# Patient Record
Sex: Male | Born: 1955 | Race: White | Hispanic: No | Marital: Married | Smoking: Former smoker
Health system: Southern US, Community
[De-identification: ages and names within clinical notes are randomized; demographics above are authoritative.]

## PROBLEM LIST (undated history)

## (undated) DIAGNOSIS — I10 Essential (primary) hypertension: Secondary | ICD-10-CM

## (undated) DIAGNOSIS — N200 Calculus of kidney: Secondary | ICD-10-CM

## (undated) DIAGNOSIS — I5031 Acute diastolic (congestive) heart failure: Secondary | ICD-10-CM

## (undated) DIAGNOSIS — I7090 Unspecified atherosclerosis: Secondary | ICD-10-CM

## (undated) DIAGNOSIS — E781 Pure hyperglyceridemia: Secondary | ICD-10-CM

## (undated) DIAGNOSIS — F1010 Alcohol abuse, uncomplicated: Secondary | ICD-10-CM

## (undated) DIAGNOSIS — F17200 Nicotine dependence, unspecified, uncomplicated: Secondary | ICD-10-CM

## (undated) DIAGNOSIS — E785 Hyperlipidemia, unspecified: Secondary | ICD-10-CM

## (undated) HISTORY — DX: Essential (primary) hypertension: I10

## (undated) HISTORY — PX: HERNIA REPAIR: SHX51

## (undated) HISTORY — DX: Alcohol abuse, uncomplicated: F10.10

## (undated) HISTORY — DX: Hyperlipidemia, unspecified: E78.5

## (undated) HISTORY — DX: Pure hyperglyceridemia: E78.1

## (undated) HISTORY — DX: Calculus of kidney: N20.0

## (undated) HISTORY — DX: Nicotine dependence, unspecified, uncomplicated: F17.200

---

## 1989-05-28 HISTORY — PX: OTHER SURGICAL HISTORY: SHX169

## 2012-02-14 ENCOUNTER — Ambulatory Visit: Payer: PRIVATE HEALTH INSURANCE | Admitting: Family Medicine

## 2012-02-14 VITALS — BP 147/81 | HR 74 | Temp 98.4°F | Resp 16 | Ht 70.75 in | Wt 171.8 lb

## 2012-02-14 DIAGNOSIS — L0390 Cellulitis, unspecified: Secondary | ICD-10-CM

## 2012-02-14 DIAGNOSIS — L0291 Cutaneous abscess, unspecified: Secondary | ICD-10-CM

## 2012-02-14 MED ORDER — DOXYCYCLINE HYCLATE 100 MG PO TABS: 100.0000 mg | ORAL_TABLET | Freq: Two times a day (BID) | ORAL | Status: AC

## 2012-02-14 NOTE — Progress Notes (Signed)
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 The last 2 days he's developed a light red enlarging rash the site of the left abdominal tic bite and some erythema and drainage by right medial by tick bites. The thigh tick bites or itching.

## 2012-02-14 NOTE — Patient Instructions (Signed)
Wood Tick Bite Ticks are insects that attach themselves to the skin. Most tick bites are harmless, but sometimes ticks carry diseases that can make a person quite ill. The chance of getting ill depends on:  The kind of tick that bites you.   Time of year.   How long the tick is attached.   Geographic location.  Wood ticks are also called dog ticks. They are generally black. They can have white markings. They live in shrubs and grassy areas. They are larger than deer ticks. Wood ticks are about the size of a watermelon seed. They have a hard body. The most common places for ticks to attach themselves are the scalp, neck, armpits, waist, and groin. Wood ticks may stay attached for up to 2 weeks. TICKS MUST BE REMOVED AS SOON AS POSSIBLE TO HELP PREVENT DISEASES CAUSED BY TICK BITES.  TO REMOVE A TICK: 1. If available, put on latex gloves before trying to remove a tick.  2. Grasp the tick as close to the skin as possible, with curved forceps, fine tweezers or a special tick removal tool.  3. Pull gently with steady pressure until the tick lets go. Do not twist the tick or jerk it suddenly. This may break off the tick's head or mouth parts.  4. Do not crush the tick's body. This could force disease-carrying fluids from the tick into your body.  5. After the tick is removed, wash the bite area and your hands with soap and water or other disinfectant.  6. Apply a small amount of antiseptic cream or ointment to the bite site.  7. Wash and disinfect any instruments that were used.  8. Save the tick in a jar or plastic bag for later identification. Preserve the tick with a bit of alcohol or put it in the freezer.  9. Do not apply a hot match, petroleum jelly, or fingernail polish to the tick. This does not work and may increase the chances of disease from the tick bite.  YOU MAY NEED TO SEE YOUR CAREGIVER FOR A TETANUS SHOT NOW IF:  You have no idea when you had the last one.   You have never had a  tetanus shot before.  If you need a tetanus shot, and you decide not to get one, there is a rare chance of getting tetanus. Sickness from tetanus can be serious. If you get a tetanus shot, your arm may swell, get red and warm to the touch at the shot site. This is common and not a problem. PREVENTION  Wear protective clothing. Long sleeves and pants are best.   Wear white clothes to see ticks more easily   Tuck your pant legs into your socks.   If walking on trail, stay in the middle of the trail to avoid brushing against bushes.   Put insect repellent on all exposed skin and along boot tops, pant legs and sleeve cuffs   Check clothing, hair and skin repeatedly and before coming inside.   Brush off any ticks that are not attached.  SEEK MEDICAL CARE IF:   You cannot remove a tick or part of the tick that is left in the skin.   Unexplained fever.   Redness and swelling in the area of the tick bite.   Tender, swollen lymph glands.   Diarrhea.   Weight loss.   Cough.   Fatigue.   Muscle, joint or bone pain.   Belly pain.   Headache.   Rash.    SEEK IMMEDIATE MEDICAL CARE IF:   You develop an oral temperature above 102 F (38.9 C).   You are having trouble walking or moving your legs.  Numbness in the legs. Lyme Disease You may have been bitten by a tick and are to watch for the development of Lyme Disease. Lyme Disease is an infection that is caused by a bacteria The bacteria causing this disease is named Borreilia burgdorferi. If a tick is infected with this bacteria and then bites you, then Lyme Disease may occur. These ticks are carried by deer and rodents such as rabbits and mice and infest grassy as well as forested areas. Fortunately most tick bites do not cause Lyme Disease.  Lyme Disease is easier to prevent than to treat. First, covering your legs with clothing when walking in areas where ticks are possibly abundant will prevent their attachment because ticks  tend to stay within inches of the ground. Second, using insecticides containing DEET can be applied on skin or clothing. Last, because it takes about 12 to 24 hours for the tick to transmit the disease after attachment to the human host, you should inspect your body for ticks twice a day when you are in areas where Lyme Disease is common. You must look thoroughly when searching for ticks. The Ixodes tick that carries Lyme Disease is very small. It is around the size of a sesame seed (picture of tick is not actual size). Removal is best done by grasping the tick by the head and pulling it out. Do not to squeeze the body of the tick. This could inject the infecting bacteria into the bite site. Wash the area of the bite with an antiseptic solution after removal.  Lyme Disease is a disease that may affect many body systems. Because of the small size of the biting tick, most people do not notice being bitten. The first sign of an infection is usually a round red rash that extends out from the center of the tick bite. The center of the lesion may be blood colored (hemorrhagic) or have tiny blisters (vesicular). Most lesions have bright red outer borders and partial central clearing. This rash may extend out many inches in diameter, and multiple lesions may be present. Other symptoms such as fatigue, headaches, chills and fever, general achiness and swelling of lymph glands may also occur. If this first stage of the disease is left untreated, these symptoms may gradually resolve by themselves, or progressive symptoms may occur because of spread of infection to other areas of the body.  Follow up with your caregiver to have testing and treatment if you have a tick bite and you develop any of the above complaints. Your caregiver may recommend preventative (prophylactic) medications which kill bacteria (antibiotics). Once a diagnosis of Lyme Disease is made, antibiotic treatment is highly likely to cure the disease.  Effective treatment of late stage Lyme Disease may require longer courses of antibiotic therapy.  MAKE SURE YOU:  Understand these instructions.  Will watch your condition.  Will get help right away if you are not doing well or get worse.  Document Released: 12/20/2000 Document Revised: 09/02/2011 Document Reviewed: 02/21/2009  [facility] Patient Information 2012 [place], [place].  Shortness of breath.   Confusion.   Repeated vomiting.  Document Released: 09/10/2000 Document Revised: 09/02/2011 Document Reviewed: 08/19/2008 [facility] Patient Information 2012 [place], [place].

## 2013-05-16 ENCOUNTER — Encounter: Payer: Self-pay | Admitting: Cardiovascular Disease

## 2013-05-16 ENCOUNTER — Ambulatory Visit (INDEPENDENT_AMBULATORY_CARE_PROVIDER_SITE_OTHER): Payer: BC Managed Care – PPO | Admitting: Cardiovascular Disease

## 2013-05-16 VITALS — BP 128/70 | HR 99 | Ht 71.0 in | Wt 179.1 lb

## 2013-05-16 DIAGNOSIS — Z79899 Other long term (current) drug therapy: Secondary | ICD-10-CM

## 2013-05-16 DIAGNOSIS — E785 Hyperlipidemia, unspecified: Secondary | ICD-10-CM

## 2013-05-16 DIAGNOSIS — Z720 Tobacco use: Secondary | ICD-10-CM | POA: Insufficient documentation

## 2013-05-16 DIAGNOSIS — F17200 Nicotine dependence, unspecified, uncomplicated: Secondary | ICD-10-CM

## 2013-05-16 DIAGNOSIS — R5383 Other fatigue: Secondary | ICD-10-CM

## 2013-05-16 DIAGNOSIS — R3911 Hesitancy of micturition: Secondary | ICD-10-CM

## 2013-05-16 DIAGNOSIS — R0602 Shortness of breath: Secondary | ICD-10-CM

## 2013-05-16 DIAGNOSIS — I10 Essential (primary) hypertension: Secondary | ICD-10-CM

## 2013-05-16 DIAGNOSIS — R5381 Other malaise: Secondary | ICD-10-CM

## 2013-05-16 NOTE — Assessment & Plan Note (Signed)
We will recheck a fasting lipid and liver profile

## 2013-05-16 NOTE — Assessment & Plan Note (Signed)
Well-controlled on current medications 

## 2013-05-16 NOTE — Progress Notes (Signed)
   05/16/2013 James Stokes James Stokes   04-06-1956  [id number]  Primary Physician No PCP Per Patient Primary Cardiologist: [name] MD Roseanne Reno   HPI:  The patient is a 57 year old, mildly overweight, married Caucasian male, father of 3 who I last saw a year ago. He has a history of hypertension and hyperlipidemia. He does smoke 2 packs a day and drinks 3 to 5 beers a night. He has hypertriglyceridemia as well. His last Myoview performed May 06, 2004, was nonischemic. Since I last saw him a year ago he denies chest pain or significantly changed breathing pattern     Current Outpatient Prescriptions  Medication Sig Dispense Refill  . amLODipine (NORVASC) 5 MG tablet Take 5 mg by mouth daily.      Marland Kitchen losartan-hydrochlorothiazide (HYZAAR) 100-25 MG per tablet Take 1 tablet by mouth daily.       No current facility-administered medications for this visit.    Allergies  Allergen Reactions  . Other     Seafood - causes itching    History   Social History  . Marital Status: Married    Spouse Name: N/A    Number of Children: N/A  . Years of Education: N/A   Occupational History  . Not on file.   Social History Main Topics  . Smoking status: Current Every Day Smoker -- 2.00 packs/day for 40 years    Types: Cigarettes  . Smokeless tobacco: Former Neurosurgeon  . Alcohol Use: Yes  . Drug Use: Not on file  . Sexual Activity: Not on file   Other Topics Concern  . Not on file   Social History Narrative  . No narrative on file     Review of Systems: General: negative for chills, fever, night sweats or weight changes.  Cardiovascular: negative for chest pain, dyspnea on exertion, edema, orthopnea, palpitations, paroxysmal nocturnal dyspnea or shortness of breath Dermatological: negative for rash Respiratory: negative for cough or wheezing Urologic: negative for hematuria Abdominal: negative for nausea, vomiting, diarrhea, bright red blood per rectum, melena, or  hematemesis Neurologic: negative for visual changes, syncope, or dizziness All other systems reviewed and are otherwise negative except as noted above.    Blood pressure 128/70, pulse 99, height 5\' 11"  (1.803 m), weight 179 lb 1.6 oz (81.239 kg).  General appearance: alert and no distress Neck: no adenopathy, no carotid bruit, no JVD, supple, symmetrical, trachea midline and thyroid not enlarged, symmetric, no tenderness/mass/nodules Lungs: clear to auscultation bilaterally Heart: regular rate and rhythm, S1, S2 normal, no murmur, click, rub or gallop Extremities: extremities normal, atraumatic, no cyanosis or edema  EKG sinus rhythm at 99 with incomplete right bundle branch block and posterior fascicular block  ASSESSMENT AND PLAN:   Essential hypertension Well-controlled on current medications  Hyperlipidemia We will recheck a fasting lipid and liver profile      [name] MD [facility], [facility] 05/16/2013 11:18 AM

## 2013-05-16 NOTE — Patient Instructions (Addendum)
  We will see you back in follow up in 1 year with an extender  Dr Allyson Sabal has ordered blood work and a chest xray

## 2013-05-17 ENCOUNTER — Telehealth: Payer: Self-pay | Admitting: Cardiovascular Disease

## 2013-05-17 ENCOUNTER — Other Ambulatory Visit: Payer: Self-pay

## 2013-05-17 MED ORDER — AMLODIPINE BESYLATE 5 MG PO TABS: 5.0000 mg | ORAL_TABLET | Freq: Every day | ORAL | Status: DC

## 2013-05-17 MED ORDER — LOSARTAN POTASSIUM-HCTZ 100-25 MG PO TABS: 1.0000 | ORAL_TABLET | Freq: Every day | ORAL | Status: DC

## 2013-05-17 NOTE — Telephone Encounter (Signed)
Please call-concerning my chart

## 2013-05-17 NOTE — Telephone Encounter (Signed)
Rx was sent to pharmacy electronically. 

## 2013-05-17 NOTE — Telephone Encounter (Signed)
Returned call and spoke w/ wife, James Stokes.  Wanted to know how to update pt's immunizations in MyChart.  Informed records will need to brought in to the office to be updated.  Also gave number to [facility] for MyChart to call in case pt is able to update via the portal.  Verbalized understanding and agreed w/ plan.

## 2013-05-23 ENCOUNTER — Ambulatory Visit
Admission: RE | Admit: 2013-05-23 | Discharge: 2013-05-23 | Disposition: A | Discharge status: Home / Self Care | Payer: BC Managed Care – PPO | Source: Ambulatory Visit | Attending: Cardiovascular Disease | Admitting: Cardiovascular Disease

## 2013-05-23 ENCOUNTER — Other Ambulatory Visit: Payer: Self-pay | Admitting: Cardiovascular Disease

## 2013-05-23 DIAGNOSIS — R0602 Shortness of breath: Secondary | ICD-10-CM

## 2013-05-23 LAB — LIPID PANEL: Triglycerides: 455 mg/dL — ABNORMAL HIGH (ref <150)

## 2013-05-23 LAB — HEPATIC FUNCTION PANEL
Alkaline Phosphatase: 75 U/L (ref 39–117)
Bilirubin, Direct: 0.1 mg/dL (ref 0.0–0.3)
Indirect Bilirubin: 0.6 mg/dL (ref 0.0–0.9)
Total Bilirubin: 0.7 mg/dL (ref 0.3–1.2)

## 2013-05-23 LAB — BASIC METABOLIC PANEL
CO2: 30 mEq/L (ref 19–32)
Calcium: 9.8 mg/dL (ref 8.4–10.5)
Chloride: 99 mEq/L (ref 96–112)
Creat: 1.25 mg/dL (ref 0.50–1.35)
Glucose, Bld: 98 mg/dL (ref 70–99)

## 2013-05-23 LAB — CBC
HCT: 49 % (ref 39.0–52.0)
Hemoglobin: 17.8 g/dL — ABNORMAL HIGH (ref 13.0–17.0)
MCH: 33.4 pg (ref 26.0–34.0)
MCHC: 36.3 g/dL — ABNORMAL HIGH (ref 30.0–36.0)
MCV: 91.9 fL (ref 78.0–100.0)
RBC: 5.33 MIL/uL (ref 4.22–5.81)

## 2013-05-24 ENCOUNTER — Other Ambulatory Visit: Payer: Self-pay | Admitting: *Deleted

## 2013-05-25 ENCOUNTER — Telehealth: Payer: Self-pay | Admitting: Cardiovascular Disease

## 2013-05-25 ENCOUNTER — Telehealth: Payer: Self-pay | Admitting: *Deleted

## 2013-05-25 DIAGNOSIS — E781 Pure hyperglyceridemia: Secondary | ICD-10-CM

## 2013-05-25 NOTE — Telephone Encounter (Signed)
Returning your call. °

## 2013-05-25 NOTE — Telephone Encounter (Signed)
Message copied by Marella Bile on Fri May 25, 2013  4:17 PM ------      Message from: [name]      Created: Thu May 24, 2013  9:01 AM       Labs look good except for elevated Trig. Was this a fasting study? Cut down on carbs and sugars. Repeat in 4-6 weeks. If still high will need to start on Tricor ------

## 2013-05-25 NOTE — Telephone Encounter (Signed)
Test results given

## 2013-06-01 ENCOUNTER — Telehealth: Payer: Self-pay | Admitting: Cardiovascular Disease

## 2013-06-01 MED ORDER — AMLODIPINE BESYLATE 5 MG PO TABS: 5.0000 mg | ORAL_TABLET | Freq: Every day | ORAL | Status: DC

## 2013-06-01 MED ORDER — LOSARTAN POTASSIUM-HCTZ 100-25 MG PO TABS: 1.0000 | ORAL_TABLET | Freq: Every day | ORAL | Status: DC

## 2013-06-01 NOTE — Telephone Encounter (Signed)
Returned call to pt's wife, James Stokes.  Stated refills were supposed to be sent when pt was seen, but weren't.  Also stated pt was supposed to be started on a new cholesterol med.  Reviewed chart.  Refills sent to [facility] [address]  Wife stated should go to [facility].  Informed will be resent.  Offered to send 7-day supply to local pharmacy and declined.  Advised she monitor pt's BP and call back if needed.  Also informed cholesterol med to be started only at repeat check if still elevated.  Verbalized understanding and agreed w/ plan.

## 2013-06-01 NOTE — Telephone Encounter (Signed)
Pt's wife was calling in regards to her husbands medications that should of been sent into the mail order pharmacy. Norvasc and Lorsarten. Also he was putting him on something else. Pt is out of medication. She stated that the mail order sent the form over but has gotten no response back.

## 2013-08-02 ENCOUNTER — Other Ambulatory Visit: Payer: Self-pay

## 2014-05-15 ENCOUNTER — Encounter: Payer: Self-pay | Admitting: Family Medicine

## 2014-05-15 ENCOUNTER — Ambulatory Visit (INDEPENDENT_AMBULATORY_CARE_PROVIDER_SITE_OTHER): Payer: BC Managed Care – PPO | Admitting: Family Medicine

## 2014-05-15 VITALS — BP 134/68 | HR 95 | Temp 98.3°F | Resp 16 | Ht 70.0 in | Wt 179.9 lb

## 2014-05-15 DIAGNOSIS — W57XXXA Bitten or stung by nonvenomous insect and other nonvenomous arthropods, initial encounter: Secondary | ICD-10-CM

## 2014-05-15 DIAGNOSIS — T148 Other injury of unspecified body region: Secondary | ICD-10-CM

## 2014-05-15 MED ORDER — DOXYCYCLINE HYCLATE 100 MG PO TABS: 100.0000 mg | ORAL_TABLET | Freq: Two times a day (BID) | ORAL | Status: DC

## 2014-05-15 NOTE — Patient Instructions (Signed)
Tick Bite Information Ticks are insects that attach themselves to the skin and draw blood for food. There are various types of ticks. Common types include wood ticks and deer ticks. Most ticks live in shrubs and grassy areas. Ticks can climb onto your body when you make contact with leaves or grass where the tick is waiting. The most common places on the body for ticks to attach themselves are the scalp, neck, armpits, waist, and groin. Most tick bites are harmless, but sometimes ticks carry germs that cause diseases. These germs can be spread to a person during the tick's feeding process. The chance of a disease spreading through a tick bite depends on:   The type of tick.  Time of year.   How long the tick is attached.   Geographic location.  HOW CAN YOU PREVENT TICK BITES? Take these steps to help prevent tick bites when you are outdoors:  Wear protective clothing. Long sleeves and long pants are best.   Wear white clothes so you can see ticks more easily.  Tuck your pant legs into your socks.   If walking on a trail, stay in the middle of the trail to avoid brushing against bushes.  Avoid walking through areas with long grass.  Put insect repellent on all exposed skin and along boot tops, pant legs, and sleeve cuffs.   Check clothing, hair, and skin repeatedly and before going inside.   Brush off any ticks that are not attached.  Take a shower or bath as soon as possible after being outdoors.  WHAT IS THE PROPER WAY TO REMOVE A TICK? Ticks should be removed as soon as possible to help prevent diseases caused by tick bites. 1. If latex gloves are available, put them on before trying to remove a tick.  2. Using fine-point tweezers, grasp the tick as close to the skin as possible. You may also use curved forceps or a tick removal tool. Grasp the tick as close to its head as possible. Avoid grasping the tick on its body. 3. Pull gently with steady upward pressure until  the tick lets go. Do not twist the tick or jerk it suddenly. This may break off the tick's head or mouth parts. 4. Do not squeeze or crush the tick's body. This could force disease-carrying fluids from the tick into your body.  5. After the tick is removed, wash the bite area and your hands with soap and water or other disinfectant such as alcohol. 6. Apply a small amount of antiseptic cream or ointment to the bite site.  7. Wash and disinfect any instruments that were used.  Do not try to remove a tick by applying a hot match, petroleum jelly, or fingernail polish to the tick. These methods do not work and may increase the chances of disease being spread from the tick bite.  WHEN SHOULD YOU SEEK MEDICAL CARE? Contact your health care provider if you are unable to remove a tick from your skin or if a part of the tick breaks off and is stuck in the skin.  After a tick bite, you need to be aware of signs and symptoms that could be related to diseases spread by ticks. Contact your health care provider if you develop any of the following in the days or weeks after the tick bite:  Unexplained fever.  Rash. A circular rash that appears days or weeks after the tick bite may indicate the possibility of Lyme disease. The rash may resemble   a target with a bull's-eye and may occur at a different part of your body than the tick bite.  Redness and swelling in the area of the tick bite.   Tender, swollen lymph glands.   Diarrhea.   Weight loss.   Cough.   Fatigue.   Muscle, joint, or bone pain.   Abdominal pain.   Headache.   Lethargy or a change in your level of consciousness.  Difficulty walking or moving your legs.   Numbness in the legs.   Paralysis.  Shortness of breath.   Confusion.   Repeated vomiting.  Document Released: 09/10/2000 Document Revised: 07/04/2013 Document Reviewed: 02/21/2013 [employer] Patient Information 2015 [employer]. This information is  not intended to replace advice given to you by your health care provider. Make sure you discuss any questions you have with your health care provider.  

## 2014-05-15 NOTE — Progress Notes (Signed)
   Subjective:  This chart was scribed for Robyn Haber, MD by Randa Evens, ED Scribe. This Patient was seen in room 13 and the patients care was started at 7:10 PM   Patient ID: James Stokes, male    DOB: [birth year], 58 y.o.   MRN: [id number] Chief Complaint  Patient presents with  . Tick Removal    tick bite    HPI HPI Comments: James Stokes is a 58 y.o. male who presents to the [facility] complaining of tick bite onset 1 day prior. He states he has associated swelling. He states that the tick bite was in between his  toes. He states he is having pain in that area. He states when he removed the tick it was engorged with blood. He denies having a gait problem.  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     Review of Systems   Objective:   BP 134/68  Pulse 95  Temp(Src) 98.3 F (36.8 C) (Oral)  Resp 16  Ht 5\' 10"  (1.778 m)  Wt 179 lb 14.2 oz (81.598 kg)  BMI 25.81 kg/m2  SpO2 91%   Physical Exam  Nursing note and vitals reviewed. Constitutional: He is oriented to person, place, and time. He appears well-developed and well-nourished. No distress.  HENT:  Head: Normocephalic and atraumatic.  Eyes: Conjunctivae and EOM are normal.  Neck: Neck supple. No tracheal deviation present.  Cardiovascular: Normal rate.   Pulmonary/Chest: Effort normal. No respiratory distress.  Musculoskeletal: Normal range of motion.  Neurological: He is alert and oriented to person, place, and time.  Skin: Skin is warm and dry.  Psychiatric: He has a normal mood and affect. His behavior is normal.    swelling and tenderness in right great toe webspace.  Assessment & Plan:  Tick bite - Plan: doxycycline (VIBRA-TABS) 100 MG tablet  Signed, Robyn Haber, MD

## 2014-06-21 ENCOUNTER — Other Ambulatory Visit: Payer: Self-pay | Admitting: Cardiovascular Disease

## 2014-06-21 NOTE — Telephone Encounter (Signed)
Rx was sent to pharmacy electronically. 

## 2014-07-17 ENCOUNTER — Ambulatory Visit (INDEPENDENT_AMBULATORY_CARE_PROVIDER_SITE_OTHER): Payer: BC Managed Care – PPO | Admitting: Cardiology

## 2014-07-17 ENCOUNTER — Encounter: Payer: Self-pay | Admitting: Cardiology

## 2014-07-17 VITALS — BP 149/96 | HR 107 | Ht 71.0 in | Wt 180.0 lb

## 2014-07-17 DIAGNOSIS — E785 Hyperlipidemia, unspecified: Secondary | ICD-10-CM

## 2014-07-17 DIAGNOSIS — Z79899 Other long term (current) drug therapy: Secondary | ICD-10-CM

## 2014-07-17 DIAGNOSIS — F17200 Nicotine dependence, unspecified, uncomplicated: Secondary | ICD-10-CM

## 2014-07-17 DIAGNOSIS — F1010 Alcohol abuse, uncomplicated: Secondary | ICD-10-CM

## 2014-07-17 DIAGNOSIS — I10 Essential (primary) hypertension: Secondary | ICD-10-CM

## 2014-07-17 DIAGNOSIS — Z720 Tobacco use: Secondary | ICD-10-CM

## 2014-07-17 MED ORDER — VERAPAMIL HCL 120 MG PO TABS: 120.0000 mg | ORAL_TABLET | Freq: Three times a day (TID) | ORAL | Status: DC

## 2014-07-17 NOTE — Assessment & Plan Note (Signed)
3-5 beers daily 

## 2014-07-17 NOTE — Assessment & Plan Note (Signed)
2 ppd 

## 2014-07-17 NOTE — Progress Notes (Signed)
    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 He has no history of documented CAD but has several cardiac risk factors. He has HTN and untreated dyslipidemia. He smokes 1-2 ppd. He drinks 3-5 beers a day. His father died at 67 from a ruptured aneurysm. "None of the men in my family live past 58". He had a low risk Myoview in 2005. He is here today for his annual check up. He denies angina symptoms. He ran out of his B/P medications a few weeks ago but has since had them refilled.    Current Outpatient Prescriptions  Medication Sig Dispense Refill  . amLODipine (NORVASC) 5 MG tablet Take 1 tablet (5 mg total) by mouth daily. MUST KEEP APPOINTMENT 07/17/2014 WITH Rekita Miotke FOR FUTURE REFILLS.  90 tablet  0  . losartan-hydrochlorothiazide (HYZAAR) 100-25 MG per tablet Take 1 tablet by mouth daily. MUST KEEP APPOINTMENT 07/17/2014 WITH Mildred Bollard FOR FUTURE REFILLS.  90 tablet  0  . verapamil (CALAN) 120 MG tablet Take 1 tablet (120 mg total) by mouth 3 (three) times daily.  30 tablet  6   No current facility-administered medications for this visit.    Allergies  Allergen Reactions  . Other     Seafood - causes itching    History   Social History  . Marital Status: Married    Spouse Name: N/A    Number of Children: N/A  . Years of Education: N/A   Occupational History  . Not on file.   Social History Main Topics  . Smoking status: Current Every Day Smoker -- 2.00 packs/day for 40 years    Types: Cigarettes  . Smokeless tobacco: Former Systems developer  . Alcohol Use: Yes  . Drug Use: Not on file  . Sexual Activity: Not on file   Other Topics Concern  . Not on file   Social History Narrative  . No narrative on file     Review of Systems: General: negative for chills, fever, night sweats or weight changes.  Cardiovascular: negative for chest pain,  dyspnea on exertion, edema, orthopnea, palpitations, paroxysmal nocturnal dyspnea or shortness of breath Dermatological: negative for rash Respiratory: negative for cough or wheezing Urologic: negative for hematuria Abdominal: negative for nausea, vomiting, diarrhea, bright red blood per rectum, melena, or hematemesis Neurologic: negative for visual changes, syncope, or dizziness All other systems reviewed and are otherwise negative except as noted above.    Blood pressure 149/96, pulse 107, height 5\' 11"  (1.803 m), weight 180 lb (81.647 kg).  General appearance: alert, cooperative and no distress Neck: no carotid bruit and no JVD Lungs: clear to auscultation bilaterally Heart: regular rate and rhythm Extremities: extremities normal, atraumatic, no cyanosis or edema  EKG NSR, ST, RAD  ASSESSMENT AND PLAN:   Essential hypertension Adequate control though his resting HR is a little fast  Hyperlipidemia His Trig have been > 400 when checked in 2012 and 2014  Current smoker 2ppd  ETOH abuse 3-5 beers daily   PLAN  We discussed smoking cessation, diet, and ETOH abuse. When his Amlodipine runs out we may want to change this to Verapamil as that may have more chronotropic affect. He should have a repeat lipid profile, CBC, and CMET in 6 months. He'll follow up in year.    Airel Magadan KPA-C 07/17/2014 5:00 PM

## 2014-07-17 NOTE — Assessment & Plan Note (Signed)
Adequate control though his resting HR is a little fast

## 2014-07-17 NOTE — Assessment & Plan Note (Signed)
His Trig have been > 400 when checked in 2012 and 2014

## 2014-07-17 NOTE — Patient Instructions (Addendum)
Your physician recommends that you schedule a follow-up appointment in: 1 Year with Dr Gwenlyn Found  Your physician has recommended you make the following change in your medication: Finish Amlodipine then Start Verapamil 120 mg   Your physician recommends that you return for lab work in: 6 Months  Your physician recommends that you schedule a follow-up appointment in: 1 Month with [facility] for Blood Pressure check and Heart rate    Smoking Cessation Quitting smoking is important to your health and has many advantages. However, it is not always easy to quit since nicotine is a very addictive drug. Oftentimes, people try 3 times or more before being able to quit. This document explains the best ways for you to prepare to quit smoking. Quitting takes hard work and a lot of effort, but you can do it. ADVANTAGES OF QUITTING SMOKING  You will live longer, feel better, and live better.  Your body will feel the impact of quitting smoking almost immediately.  Within 20 minutes, blood pressure decreases. Your pulse returns to its normal level.  After 8 hours, carbon monoxide levels in the blood return to normal. Your oxygen level increases.  After 24 hours, the chance of having a heart attack starts to decrease. Your breath, hair, and body stop smelling like smoke.  After 48 hours, damaged nerve endings begin to recover. Your sense of taste and smell improve.  After 72 hours, the body is virtually free of nicotine. Your bronchial tubes relax and breathing becomes easier.  After 2 to 12 weeks, lungs can hold more air. Exercise becomes easier and circulation improves.  The risk of having a heart attack, stroke, cancer, or lung disease is greatly reduced.  After 1 year, the risk of coronary heart disease is cut in half.  After 5 years, the risk of stroke falls to the same as a nonsmoker.  After 10 years, the risk of lung cancer is cut in half and the risk of other cancers decreases  significantly.  After 15 years, the risk of coronary heart disease drops, usually to the level of a nonsmoker.  If you are pregnant, quitting smoking will improve your chances of having a healthy baby.  The people you live with, especially any children, will be healthier.  You will have extra money to spend on things other than cigarettes. QUESTIONS TO THINK ABOUT BEFORE ATTEMPTING TO QUIT You may want to talk about your answers with your health care provider.  Why do you want to quit?  If you tried to quit in the past, what helped and what did not?  What will be the most difficult situations for you after you quit? How will you plan to handle them?  Who can help you through the tough times? Your family? Friends? A health care provider?  What pleasures do you get from smoking? What ways can you still get pleasure if you quit? Here are some questions to ask your health care provider:  How can you help me to be successful at quitting?  What medicine do you think would be best for me and how should I take it?  What should I do if I need more help?  What is smoking withdrawal like? How can I get information on withdrawal? GET READY  Set a quit date.  Change your environment by getting rid of all cigarettes, ashtrays, matches, and lighters in your home, car, or work. Do not let people smoke in your home.  Review your past attempts to quit.  Think about what worked and what did not. GET SUPPORT AND ENCOURAGEMENT You have a better chance of being successful if you have help. You can get support in many ways.  Tell your family, friends, and coworkers that you are going to quit and need their support. Ask them not to smoke around you.  Get individual, group, or telephone counseling and support. Programs are available at [employer] and health centers. Call your local health department for information about programs in your area.  Spiritual beliefs and practices may help some  smokers quit.  Download a "quit meter" on your computer to keep track of quit statistics, such as how long you have gone without smoking, cigarettes not smoked, and money saved.  Get a self-help book about quitting smoking and staying off tobacco. Sacaton yourself from urges to smoke. Talk to someone, go for a walk, or occupy your time with a task.  Change your normal routine. Take a different route to work. Drink tea instead of coffee. Eat breakfast in a different place.  Reduce your stress. Take a hot bath, exercise, or read a book.  Plan something enjoyable to do every day. Reward yourself for not smoking.  Explore interactive web-based programs that specialize in helping you quit. GET MEDICINE AND USE IT CORRECTLY Medicines can help you stop smoking and decrease the urge to smoke. Combining medicine with the above behavioral methods and support can greatly increase your chances of successfully quitting smoking.  Nicotine replacement therapy helps deliver nicotine to your body without the negative effects and risks of smoking. Nicotine replacement therapy includes nicotine gum, lozenges, inhalers, nasal sprays, and skin patches. Some may be available over-the-counter and others require a prescription.  Antidepressant medicine helps people abstain from smoking, but how this works is unknown. This medicine is available by prescription.  Nicotinic receptor partial agonist medicine simulates the effect of nicotine in your brain. This medicine is available by prescription. Ask your health care provider for advice about which medicines to use and how to use them based on your health history. Your health care provider will tell you what side effects to look out for if you choose to be on a medicine or therapy. Carefully read the information on the package. Do not use any other product containing nicotine while using a nicotine replacement product.  RELAPSE OR  DIFFICULT SITUATIONS Most relapses occur within the first 3 months after quitting. Do not be discouraged if you start smoking again. Remember, most people try several times before finally quitting. You may have symptoms of withdrawal because your body is used to nicotine. You may crave cigarettes, be irritable, feel very hungry, cough often, get headaches, or have difficulty concentrating. The withdrawal symptoms are only temporary. They are strongest when you first quit, but they will go away within 10-14 days. To reduce the chances of relapse, try to:  Avoid drinking alcohol. Drinking lowers your chances of successfully quitting.  Reduce the amount of caffeine you consume. Once you quit smoking, the amount of caffeine in your body increases and can give you symptoms, such as a rapid heartbeat, sweating, and anxiety.  Avoid smokers because they can make you want to smoke.  Do not let weight gain distract you. Many smokers will gain weight when they quit, usually less than 10 pounds. Eat a healthy diet and stay active. You can always lose the weight gained after you quit.  Find ways to improve your mood other  than smoking. FOR MORE INFORMATION  www.smokefree.gov  Document Released: 09/07/2001 Document Revised: 01/28/2014 Document Reviewed: 12/23/2011 [facility] Patient Information 2015 [place], [place]. This information is not intended to replace advice given to you by your health care provider. Make sure you discuss any questions you have with your health care provider. Fat and Cholesterol Control Diet Fat and cholesterol levels in your blood and organs are influenced by your diet. High levels of fat and cholesterol may lead to diseases of the heart, small and large blood vessels, gallbladder, liver, and pancreas. CONTROLLING FAT AND CHOLESTEROL WITH DIET Although exercise and lifestyle factors are important, your diet is key. That is because certain foods are known to raise cholesterol and others  to lower it. The goal is to balance foods for their effect on cholesterol and more importantly, to replace saturated and trans fat with other types of fat, such as monounsaturated fat, polyunsaturated fat, and omega-3 fatty acids. On average, a person should consume no more than 15 to 17 g of saturated fat daily. Saturated and trans fats are considered "bad" fats, and they will raise LDL cholesterol. Saturated fats are primarily found in animal products such as meats, butter, and cream. However, that does not mean you need to give up all your favorite foods. Today, there are good tasting, low-fat, low-cholesterol substitutes for most of the things you like to eat. Choose low-fat or nonfat alternatives. Choose round or loin cuts of red meat. These types of cuts are lowest in fat and cholesterol. Chicken (without the skin), fish, veal, and ground [place] breast are great choices. Eliminate fatty meats, such as hot dogs and salami. Even shellfish have little or no saturated fat. Have a 3 oz (85 g) portion when you eat lean meat, poultry, or fish. Trans fats are also called "partially hydrogenated oils." They are oils that have been scientifically manipulated so that they are solid at room temperature resulting in a longer shelf life and improved taste and texture of foods in which they are added. Trans fats are found in stick margarine, some tub margarines, cookies, crackers, and baked goods.  When baking and cooking, oils are a great substitute for butter. The monounsaturated oils are especially beneficial since it is believed they lower LDL and raise HDL. The oils you should avoid entirely are saturated tropical oils, such as coconut and palm.  Remember to eat a lot from food groups that are naturally free of saturated and trans fat, including fish, fruit, vegetables, beans, grains (barley, rice, couscous, bulgur wheat), and pasta (without cream sauces).  IDENTIFYING FOODS THAT LOWER FAT AND CHOLESTEROL  Soluble  fiber may lower your cholesterol. This type of fiber is found in fruits such as apples, vegetables such as broccoli, potatoes, and carrots, legumes such as beans, peas, and lentils, and grains such as barley. Foods fortified with plant sterols (phytosterol) may also lower cholesterol. You should eat at least 2 g per day of these foods for a cholesterol lowering effect.  Read package labels to identify low-saturated fats, trans fat free, and low-fat foods at the supermarket. Select cheeses that have only 2 to 3 g saturated fat per ounce. Use a heart-healthy tub margarine that is free of trans fats or partially hydrogenated oil. When buying baked goods (cookies, crackers), avoid partially hydrogenated oils. Breads and muffins should be made from whole grains (whole-wheat or whole oat flour, instead of "flour" or "enriched flour"). Buy non-creamy canned soups with reduced salt and no added fats.  FOOD  PREPARATION TECHNIQUES  Never deep-fry. If you must fry, either stir-fry, which uses very little fat, or use non-stick cooking sprays. When possible, broil, bake, or roast meats, and steam vegetables. Instead of putting butter or margarine on vegetables, use lemon and herbs, applesauce, and cinnamon (for squash and sweet potatoes). Use nonfat yogurt, salsa, and low-fat dressings for salads.  LOW-SATURATED FAT / LOW-FAT FOOD SUBSTITUTES Meats / Saturated Fat (g)  Avoid: Steak, marbled (3 oz/85 g) / 11 g  Choose: Steak, lean (3 oz/85 g) / 4 g  Avoid: Hamburger (3 oz/85 g) / 7 g  Choose: Hamburger, lean (3 oz/85 g) / 5 g  Avoid: Ham (3 oz/85 g) / 6 g  Choose: Ham, lean cut (3 oz/85 g) / 2.4 g  Avoid: Chicken, with skin, dark meat (3 oz/85 g) / 4 g  Choose: Chicken, skin removed, dark meat (3 oz/85 g) / 2 g  Avoid: Chicken, with skin, light meat (3 oz/85 g) / 2.5 g  Choose: Chicken, skin removed, light meat (3 oz/85 g) / 1 g Dairy / Saturated Fat (g)  Avoid: Whole milk (1 cup) / 5 g  Choose:  Low-fat milk, 2% (1 cup) / 3 g  Choose: Low-fat milk, 1% (1 cup) / 1.5 g  Choose: Skim milk (1 cup) / 0.3 g  Avoid: Hard cheese (1 oz/28 g) / 6 g  Choose: Skim milk cheese (1 oz/28 g) / 2 to 3 g  Avoid: Cottage cheese, 4% fat (1 cup) / 6.5 g  Choose: Low-fat cottage cheese, 1% fat (1 cup) / 1.5 g  Avoid: Ice cream (1 cup) / 9 g  Choose: Sherbet (1 cup) / 2.5 g  Choose: Nonfat frozen yogurt (1 cup) / 0.3 g  Choose: Frozen fruit bar / trace  Avoid: Whipped cream (1 tbs) / 3.5 g  Choose: Nondairy whipped topping (1 tbs) / 1 g Condiments / Saturated Fat (g)  Avoid: Mayonnaise (1 tbs) / 2 g  Choose: Low-fat mayonnaise (1 tbs) / 1 g  Avoid: Butter (1 tbs) / 7 g  Choose: Extra light margarine (1 tbs) / 1 g  Avoid: Coconut oil (1 tbs) / 11.8 g  Choose: Olive oil (1 tbs) / 1.8 g  Choose: Corn oil (1 tbs) / 1.7 g  Choose: Safflower oil (1 tbs) / 1.2 g  Choose: Sunflower oil (1 tbs) / 1.4 g  Choose: Soybean oil (1 tbs) / 2.4 g  Choose: Canola oil (1 tbs) / 1 g Document Released: 09/13/2005 Document Revised: 01/08/2013 Document Reviewed: 12/12/2013 [employer] Patient Information 2015 [place], [place]. This information is not intended to replace advice given to you by your health care provider. Make sure you discuss any questions you have with your health care

## 2015-01-03 ENCOUNTER — Other Ambulatory Visit: Payer: Self-pay | Admitting: Cardiology

## 2015-08-13 ENCOUNTER — Other Ambulatory Visit: Payer: Self-pay | Admitting: Cardiovascular Disease

## 2015-08-13 NOTE — Telephone Encounter (Signed)
Rx(s) sent to pharmacy electronically.  

## 2015-09-13 ENCOUNTER — Other Ambulatory Visit: Payer: Self-pay | Admitting: Cardiovascular Disease

## 2015-10-14 ENCOUNTER — Ambulatory Visit (INDEPENDENT_AMBULATORY_CARE_PROVIDER_SITE_OTHER): Payer: 59 | Admitting: Family Medicine

## 2015-10-14 ENCOUNTER — Encounter: Payer: Self-pay | Admitting: Family Medicine

## 2015-10-14 VITALS — BP 136/88 | HR 103 | Temp 98.4°F | Resp 17 | Ht 69.5 in | Wt 176.0 lb

## 2015-10-14 DIAGNOSIS — J209 Acute bronchitis, unspecified: Secondary | ICD-10-CM

## 2015-10-14 MED ORDER — PREDNISONE 20 MG PO TABS: ORAL_TABLET | ORAL | Status: DC

## 2015-10-14 MED ORDER — LEVOFLOXACIN 500 MG PO TABS: 500.0000 mg | ORAL_TABLET | Freq: Every day | ORAL | Status: DC

## 2015-10-14 NOTE — Progress Notes (Signed)
Subjective:  This chart was scribed for James Haber MD,  by James Stokes, at [facility].  This patient was seen in room 14 and the patient's care was started at 1:11 PM.    Patient ID: James Stokes, male    DOB: 1956/07/19, 60 y.o.   MRN: [id number] Chief Complaint  Patient presents with  . Shortness of Breath  . Cough  . URI  . Fatigue    HPI HPI Comments: James Stokes is a 60 y.o. male who presents to the [facility] complaining of a cough, SOB and fatigue onset 1 week ago but states that he felt the worst yesterday.  Patient states that his cough was productive but is not so much anymore.  Patient took Nitrofusion last night which he states helped him considerably. Patient is a smoker and states that he has "somewhat" cut back on cigarettes.  He has used an inhaler in the past. Patient is a Furniture conservator/restorer. He has no other complaints or concerns today.    Patient Active Problem List   Diagnosis Date Noted  . ETOH abuse 07/17/2014  . Current smoker 07/17/2014  . Essential hypertension 05/16/2013  . Hyperlipidemia 05/16/2013  . Tobacco abuse 05/16/2013   Past Medical History  Diagnosis Date  . HTN (hypertension)   . Dyslipidemia   . Smoker   . ETOH abuse   . Kidney stones   . Hypertriglyceridemia    Past Surgical History  Procedure Laterality Date  . Hernia repair      pt was 66 month old  . 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 Hernia repair      double hernia pt unsure of exact date, DR. Ballen did surgery   Allergies  Allergen Reactions  . Other     Seafood - causes itching   Prior to Admission medications   Medication Sig Start Date End Date Taking? Authorizing Provider  amLODipine (NORVASC) 5 MG tablet Take 1 tablet (5 mg total) by mouth daily. MUST KEEP APPOINTMENT 07/17/2014 WITH LUKE KILROY FOR FUTURE REFILLS. 06/21/14   Lorretta Harp, MD  losartan-hydrochlorothiazide (HYZAAR) 100-25 MG per  tablet Take 1 tablet by mouth daily. MUST KEEP APPOINTMENT 07/17/2014 WITH LUKE KILROY FOR FUTURE REFILLS. 06/21/14   Lorretta Harp, MD  verapamil (CALAN) 120 MG tablet TAKE 1 TABLET BY MOUTH 3 TIMES A DAY 09/15/15   Troy Sine, MD   Social History   Social History  . Marital Status: Married    Spouse Name: N/A  . Number of Children: N/A  . Years of Education: N/A   Occupational History  . Not on file.   Social History Main Topics  . Smoking status: Current Every Day Smoker -- 2.00 packs/day for 43 years    Types: Cigarettes  . Smokeless tobacco: Former Systems developer  . Alcohol Use: 9.0 oz/week    15 Cans of beer per week  . Drug Use: Yes    Special: Marijuana  . Sexual Activity: Not on file   Other Topics Concern  . Not on file   Social History Narrative       Review of Systems  Constitutional: Positive for fatigue. Negative for fever and chills.  Eyes: Negative for pain and redness.  Respiratory: Positive for cough and shortness of breath.   Gastrointestinal: Negative for nausea and vomiting.  Musculoskeletal: Negative for neck pain and neck stiffness.  Skin:  Negative for color change.       Objective:   Physical Exam  Constitutional: He is oriented to person, place, and time. He appears well-developed and well-nourished. No distress.  HENT:  Head: Normocephalic and atraumatic.  Very red posterior pharynx.   Eyes: Pupils are equal, round, and reactive to light.  Neck: Normal range of motion.  Pulmonary/Chest: He has rales.  Rales in chest.   Musculoskeletal: Normal range of motion.  Neurological: He is alert and oriented to person, place, and time.  Skin: Skin is warm and dry.  Psychiatric: He has a normal mood and affect. His behavior is normal.   Filed Vitals:   10/14/15 1304  BP: 136/88  Pulse: 103  Temp: 98.4 F (36.9 C)  TempSrc: Oral  Resp: 17  Height: 5' 9.5" (1.765 m)  Weight: 176 lb (79.833 kg)  SpO2: 91%       Assessment & Plan:    This chart was scribed in my presence and reviewed by me personally.    ICD-9-CM ICD-10-CM   1. Acute bronchitis, unspecified organism 466.0 J20.9 levofloxacin (LEVAQUIN) 500 MG tablet     predniSONE (DELTASONE) 20 MG tablet     Signed, James Haber, MD

## 2015-10-14 NOTE — Patient Instructions (Signed)

## 2015-10-19 ENCOUNTER — Other Ambulatory Visit: Payer: Self-pay | Admitting: Cardiovascular Disease

## 2015-10-21 NOTE — Telephone Encounter (Signed)
Rx(s) sent to pharmacy electronically.  

## 2015-12-02 ENCOUNTER — Ambulatory Visit (INDEPENDENT_AMBULATORY_CARE_PROVIDER_SITE_OTHER): Payer: Self-pay | Admitting: Cardiovascular Disease

## 2015-12-02 ENCOUNTER — Encounter: Payer: Self-pay | Admitting: Cardiovascular Disease

## 2015-12-02 DIAGNOSIS — I10 Essential (primary) hypertension: Secondary | ICD-10-CM

## 2015-12-02 LAB — CBC WITH DIFFERENTIAL/PLATELET
BASOS ABS: 0.1 10*3/uL (ref 0.0–0.1)
BASOS PCT: 1 % (ref 0–1)
EOS PCT: 2 % (ref 0–5)
Eosinophils Absolute: 0.2 10*3/uL (ref 0.0–0.7)
HEMATOCRIT: 51.1 % (ref 39.0–52.0)
Hemoglobin: 18.2 g/dL — ABNORMAL HIGH (ref 13.0–17.0)
LYMPHS PCT: 24 % (ref 12–46)
Lymphs Abs: 2.1 10*3/uL (ref 0.7–4.0)
MCH: 33.3 pg (ref 26.0–34.0)
MCHC: 35.6 g/dL (ref 30.0–36.0)
MCV: 93.6 fL (ref 78.0–100.0)
MONO ABS: 0.6 10*3/uL (ref 0.1–1.0)
MPV: 9.2 fL (ref 8.6–12.4)
Monocytes Relative: 7 % (ref 3–12)
Neutro Abs: 5.7 10*3/uL (ref 1.7–7.7)
Neutrophils Relative %: 66 % (ref 43–77)
PLATELETS: 214 10*3/uL (ref 150–400)
RBC: 5.46 MIL/uL (ref 4.22–5.81)
RDW: 13 % (ref 11.5–15.5)
WBC: 8.6 10*3/uL (ref 4.0–10.5)

## 2015-12-02 LAB — TSH: TSH: 2.07 m[IU]/L (ref 0.40–4.50)

## 2015-12-02 LAB — T4, FREE: Free T4: 1 ng/dL (ref 0.8–1.8)

## 2015-12-02 MED ORDER — LISINOPRIL 5 MG PO TABS: 5.0000 mg | ORAL_TABLET | Freq: Every day | ORAL | Status: DC

## 2015-12-02 MED ORDER — AMLODIPINE BESYLATE 5 MG PO TABS: 5.0000 mg | ORAL_TABLET | Freq: Every day | ORAL | Status: DC

## 2015-12-02 NOTE — Assessment & Plan Note (Signed)
History of tobacco abuse, smoking one to 2 packs a day recalcitrant to risk factor modification.

## 2015-12-02 NOTE — Progress Notes (Signed)
12/02/2015 [place]   11-10-1955  [id number]  Primary Physician No PCP Per Patient Primary Cardiologist: Lorretta Harp MD Renae Gloss   HPI:   The patient is a 60 year old, mildly overweight, married Caucasian male, father of 3 who I last saw 05/16/13.Marland Kitchen He has a history of hypertension and hyperlipidemia. He does smoke 2 packs a day and drinks 3 to 5 beers a night. He has hypertriglyceridemia as well. His last Myoview performed May 06, 2004, was nonischemic. Since I last saw him a year ago he denies chest pain or significantly changed breathing pattern. Since I saw him approximately 2 years ago he denies chest pain or shortness of breath. Does continue to smoke one to 2 packs a day and drinks 3-5. Today recalcitrant to behavioral modification.    Current Outpatient Prescriptions  Medication Sig Dispense Refill  . aspirin 81 MG tablet Take 81 mg by mouth daily.    . Multiple Vitamins-Minerals (MULTIVITAMIN ADULT PO) Take 1 tablet by mouth daily.     No current facility-administered medications for this visit.    Allergies  Allergen Reactions  . Other     Seafood - causes itching    Social History   Social History  . Marital Status: Married    Spouse Name: N/A  . Number of Children: N/A  . Years of Education: N/A   Occupational History  . Not on file.   Social History Main Topics  . Smoking status: Current Every Day Smoker -- 2.00 packs/day for 43 years    Types: Cigarettes  . Smokeless tobacco: Former Systems developer  . Alcohol Use: 9.0 oz/week    15 Cans of beer per week  . Drug Use: Yes    Special: Marijuana  . Sexual Activity: Not on file   Other Topics Concern  . Not on file   Social History Narrative     Review of Systems: General: negative for chills, fever, night sweats or weight changes.  Cardiovascular: negative for chest pain, dyspnea on exertion, edema, orthopnea, palpitations, paroxysmal nocturnal dyspnea or shortness of  breath Dermatological: negative for rash Respiratory: negative for cough or wheezing Urologic: negative for hematuria Abdominal: negative for nausea, vomiting, diarrhea, bright red blood per rectum, melena, or hematemesis Neurologic: negative for visual changes, syncope, or dizziness All other systems reviewed and are otherwise negative except as noted above.    Blood pressure 148/92, pulse 86, height 6' (1.829 m), weight 182 lb (82.555 kg).  General appearance: alert and no distress Neck: no adenopathy, no carotid bruit, no JVD, supple, symmetrical, trachea midline and thyroid not enlarged, symmetric, no tenderness/mass/nodules Lungs: clear to auscultation bilaterally Heart: regular rate and rhythm, S1, S2 normal, no murmur, click, rub or gallop Extremities: extremities normal, atraumatic, no cyanosis or edema  EKG normal sinus rhythm at 86 with right axis deviation. I personally reviewed this EKG  ASSESSMENT AND PLAN:   Essential hypertension History of hypertension blood pressure medicines. 140/90. He does admit to dietary indiscretion here. He is on verapamil 3 times a day which he admits to being noncompliant with. I'm going to change this to amlodipine 5 mg a day and lisinopril 5 mg a day. We'll check lab work on him today and again in 3 weeks. He'll see Erasmo Downer back in one month for titration  Hyperlipidemia History of hyperlipidemia not on statin therapy. We'll recheck a lipid and liver profile.  Tobacco abuse History of tobacco abuse, smoking one to 2 packs a  day recalcitrant to risk factor modification.      Lorretta Harp MD FACP,FACC,FAHA, [facility] 12/02/2015 9:21 AM

## 2015-12-02 NOTE — Assessment & Plan Note (Signed)
History of hyperlipidemia not on statin therapy. We'll recheck a lipid and liver profile 

## 2015-12-02 NOTE — Patient Instructions (Signed)
Medication Instructions:  Your physician has recommended you make the following change in your medication:  1) STOP Verapamil 2) START Amlodipine 5 mg by mouth ONCE daily 3) START Lisinopril 5 mg by mouth ONCE daily   Labwork: Your physician recommends that you return for lab work in: [place] -  The lab can be found on the [address] of out building in [place] recommends that you return for lab work in: 3 weeks for BMET     Testing/Procedures: none  Follow-Up: Your physician recommends that you schedule a follow-up appointment in: 4 weeks with Erasmo Downer, PharmD - [facility]  Your physician wants you to follow-up in: 12 months with Dr. Gwenlyn Found. You will receive a reminder letter in the mail two months in advance. If you don't receive a letter, please call our office to schedule the follow-up appointment.   Any Other Special Instructions Will Be Listed Below (If Applicable).     If you need a refill on your cardiac medications before your next appointment, please call your pharmacy.

## 2015-12-02 NOTE — Assessment & Plan Note (Signed)
History of hypertension blood pressure medicines. 140/90. He does admit to dietary indiscretion here. He is on verapamil 3 times a day which he admits to being noncompliant with. I'm going to change this to amlodipine 5 mg a day and lisinopril 5 mg a day. We'll check lab work on him today and again in 3 weeks. He'll see Erasmo Downer back in one month for titration

## 2015-12-03 LAB — COMPREHENSIVE METABOLIC PANEL
ALBUMIN: 4.1 g/dL (ref 3.6–5.1)
ALK PHOS: 92 U/L (ref 40–115)
ALT: 30 U/L (ref 9–46)
AST: 20 U/L (ref 10–35)
BUN: 15 mg/dL (ref 7–25)
CO2: 25 mmol/L (ref 20–31)
CREATININE: 1.14 mg/dL (ref 0.70–1.33)
Calcium: 9.2 mg/dL (ref 8.6–10.3)
Chloride: 101 mmol/L (ref 98–110)
Glucose, Bld: 85 mg/dL (ref 65–99)
Potassium: 4.2 mmol/L (ref 3.5–5.3)
SODIUM: 137 mmol/L (ref 135–146)
TOTAL PROTEIN: 6.8 g/dL (ref 6.1–8.1)
Total Bilirubin: 0.5 mg/dL (ref 0.2–1.2)

## 2015-12-03 LAB — BASIC METABOLIC PANEL
BUN: 15 mg/dL (ref 7–25)
CHLORIDE: 101 mmol/L (ref 98–110)
CO2: 25 mmol/L (ref 20–31)
Calcium: 9.2 mg/dL (ref 8.6–10.3)
Creat: 1.14 mg/dL (ref 0.70–1.33)
Glucose, Bld: 85 mg/dL (ref 65–99)
POTASSIUM: 4.2 mmol/L (ref 3.5–5.3)
SODIUM: 137 mmol/L (ref 135–146)

## 2015-12-03 LAB — LIPID PANEL
CHOL/HDL RATIO: 7.9 ratio — AB (ref <=5.0)
CHOLESTEROL: 267 mg/dL — AB (ref 125–200)
HDL: 34 mg/dL — AB (ref >=40)
TRIGLYCERIDES: 853 mg/dL — AB (ref <150)

## 2015-12-03 LAB — HEMOGLOBIN A1C
Hgb A1c MFr Bld: 5.5 % (ref <5.7)
Mean Plasma Glucose: 111 mg/dL (ref <117)

## 2015-12-03 LAB — PSA: PSA: 0.89 ng/mL (ref <=4.00)

## 2015-12-03 LAB — MAGNESIUM: MAGNESIUM: 2.1 mg/dL (ref 1.5–2.5)

## 2015-12-03 LAB — LDL CHOLESTEROL, DIRECT: LDL DIRECT: 65 mg/dL (ref <130)

## 2015-12-21 ENCOUNTER — Other Ambulatory Visit: Payer: Self-pay | Admitting: Cardiovascular Disease

## 2015-12-22 NOTE — Telephone Encounter (Signed)
Rx request sent to pharmacy.  

## 2015-12-31 ENCOUNTER — Ambulatory Visit (INDEPENDENT_AMBULATORY_CARE_PROVIDER_SITE_OTHER): Payer: 59 | Admitting: Urgent Care

## 2015-12-31 ENCOUNTER — Ambulatory Visit (INDEPENDENT_AMBULATORY_CARE_PROVIDER_SITE_OTHER): Payer: 59

## 2015-12-31 VITALS — BP 160/94 | HR 126 | Temp 99.6°F | Resp 18 | Ht 72.0 in | Wt 179.6 lb

## 2015-12-31 DIAGNOSIS — J029 Acute pharyngitis, unspecified: Secondary | ICD-10-CM | POA: Diagnosis not present

## 2015-12-31 DIAGNOSIS — R059 Cough, unspecified: Secondary | ICD-10-CM

## 2015-12-31 DIAGNOSIS — F17200 Nicotine dependence, unspecified, uncomplicated: Secondary | ICD-10-CM

## 2015-12-31 DIAGNOSIS — J988 Other specified respiratory disorders: Secondary | ICD-10-CM | POA: Diagnosis not present

## 2015-12-31 DIAGNOSIS — J22 Unspecified acute lower respiratory infection: Secondary | ICD-10-CM

## 2015-12-31 DIAGNOSIS — R05 Cough: Secondary | ICD-10-CM

## 2015-12-31 DIAGNOSIS — R0789 Other chest pain: Secondary | ICD-10-CM

## 2015-12-31 LAB — POCT CBC
Granulocyte percent: 76.7 %G (ref 37–80)
HEMATOCRIT: 49.4 % (ref 43.5–53.7)
HEMOGLOBIN: 18.2 g/dL — AB (ref 14.1–18.1)
LYMPH, POC: 1.3 (ref 0.6–3.4)
MCH, POC: 34.3 pg — AB (ref 27–31.2)
MCHC: 36.8 g/dL — AB (ref 31.8–35.4)
MCV: 93.2 fL (ref 80–97)
MID (cbc): 0.4 (ref 0–0.9)
MPV: 6.2 fL (ref 0–99.8)
POC GRANULOCYTE: 5.6 (ref 2–6.9)
POC LYMPH PERCENT: 18.2 %L (ref 10–50)
POC MID %: 5.1 %M (ref 0–12)
Platelet Count, POC: 191 10*3/uL (ref 142–424)
RBC: 5.3 M/uL (ref 4.69–6.13)
RDW, POC: 12.9 %
WBC: 7.3 10*3/uL (ref 4.6–10.2)

## 2015-12-31 MED ORDER — HYDROCODONE-HOMATROPINE 5-1.5 MG/5ML PO SYRP: 5.0000 mL | ORAL_SOLUTION | Freq: Every evening | ORAL | Status: DC | PRN

## 2015-12-31 MED ORDER — ALBUTEROL SULFATE HFA 108 (90 BASE) MCG/ACT IN AERS: 2.0000 | INHALATION_SPRAY | Freq: Four times a day (QID) | RESPIRATORY_TRACT | Status: DC | PRN

## 2015-12-31 MED ORDER — PREDNISONE 20 MG PO TABS: ORAL_TABLET | ORAL | Status: DC

## 2015-12-31 MED ORDER — AZITHROMYCIN 250 MG PO TABS: ORAL_TABLET | ORAL | Status: DC

## 2015-12-31 NOTE — Patient Instructions (Addendum)
Cough, Adult Coughing is a reflex that clears your throat and your airways. Coughing helps to heal and protect your lungs. It is normal to cough occasionally, but a cough that happens with other symptoms or lasts a long time may be a sign of a condition that needs treatment. A cough may last only 2-3 weeks (acute), or it may last longer than 8 weeks (chronic). CAUSES Coughing is commonly caused by:  Breathing in substances that irritate your lungs.  A viral or bacterial respiratory infection.  Allergies.  Asthma.  Postnasal drip.  Smoking.  Acid backing up from the stomach into the esophagus (gastroesophageal reflux).  Certain medicines.  Chronic lung problems, including COPD (or rarely, lung cancer).  Other medical conditions such as heart failure. HOME CARE INSTRUCTIONS  Pay attention to any changes in your symptoms. Take these actions to help with your discomfort:  Take medicines only as told by your health care provider.  If you were prescribed an antibiotic medicine, take it as told by your health care provider. Do not stop taking the antibiotic even if you start to feel better.  Talk with your health care provider before you take a cough suppressant medicine.  Drink enough fluid to keep your urine clear or pale yellow.  If the air is dry, use a cold steam vaporizer or humidifier in your bedroom or your home to help loosen secretions.  Avoid anything that causes you to cough at work or at home.  If your cough is worse at night, try sleeping in a semi-upright position.  Avoid cigarette smoke. If you smoke, quit smoking. If you need help quitting, ask your health care provider.  Avoid caffeine.  Avoid alcohol.  Rest as needed. SEEK MEDICAL CARE IF:   You have new symptoms.  You cough up pus.  Your cough does not get better after 2-3 weeks, or your cough gets worse.  You cannot control your cough with suppressant medicines and you are losing sleep.  You  develop pain that is getting worse or pain that is not controlled with pain medicines.  You have a fever.  You have unexplained weight loss.  You have night sweats. SEEK IMMEDIATE MEDICAL CARE IF:  You cough up blood.  You have difficulty breathing.  Your heartbeat is very fast.   This information is not intended to replace advice given to you by your health care provider. Make sure you discuss any questions you have with your health care provider.   Document Released: 03/12/2011 Document Revised: 06/04/2015 Document Reviewed: 11/20/2014 Elsevier Interactive Patient Education 2016 [employer].     IF you received an x-ray today, you will receive an invoice from [facility]. Please contact [facility] at [phone] with questions or concerns regarding your invoice.   IF you received labwork today, you will receive an invoice from [employer]. Please contact [facility] at [phone] with questions or concerns regarding your invoice.   Our billing staff will not be able to assist you with questions regarding bills from these companies.  You will be contacted with the lab results as soon as they are available. The fastest way to get your results is to activate your My Chart account. Instructions are located on the last page of this paperwork. If you have not heard from [place] regarding the results in 2 weeks, please contact this office.     c

## 2015-12-31 NOTE — Progress Notes (Signed)
MRN: [id number] DOB: Feb 12, 1956  Subjective:   James Stokes is a 60 y.o. male presenting for chief complaint of Cough; Generalized Body Aches; and low energy  Reports 4 day history of productive cough, cough elicits chest pain. Also has fatigue, subjective fever, mild sore throat. Has tried otc medication with minimal relief. Has continued to half days work but had to take time off due to malaise. Admits history of seasonal allergies in Spring. Denies history of asthma. Smokes ~2ppd, has done this for 40 years. Patient has a cardiologist, Dr. Gwenlyn Found. His last visit was 12/02/2015. Has follow up next week.  James Stokes has a current medication list which includes the following prescription(s): amlodipine, aspirin, lisinopril, and multiple vitamins-minerals. Also is allergic to other.  James Stokes  has a past medical history of HTN (hypertension); Dyslipidemia; Smoker; ETOH abuse; Kidney stones; and Hypertriglyceridemia. Also  has past surgical history that includes Hernia repair; lipoma (05/1989); and Hernia repair.  Objective:   Vitals: BP 160/94 mmHg  Pulse 126  Temp(Src) 99.6 F (37.6 C) (Oral)  Resp 18  Ht 6' (1.829 m)  Wt 179 lb 9.6 oz (81.466 kg)  BMI 24.35 kg/m2  SpO2 97%  BP was 130/74 on recheck by CMA Rose.  Pulse was 110 on recheck by PA-Katelyne Galster.  Physical Exam  Constitutional: He is oriented to person, place, and time. He appears well-developed and well-nourished.  HENT:  Mouth/Throat: Oropharynx is clear and moist.  Eyes: No scleral icterus.  Neck: Normal range of motion. Neck supple.  Cardiovascular: Normal rate, regular rhythm and intact distal pulses.  Exam reveals no gallop and no friction rub.   No murmur heard. Pulmonary/Chest: No respiratory distress. He has no wheezes. He has no rales.  Coarse lung sounds throughout  Lymphadenopathy:    He has no cervical adenopathy.  Neurological: He is alert and oriented to person, place, and time.  Skin: Skin is warm and  dry. No rash noted. No erythema. No pallor.   Dg Chest 2 View  12/31/2015  CLINICAL DATA:  Cough, body aches, fatigue EXAM: CHEST  2 VIEW COMPARISON:  Chest x-ray of 05/23/2013 FINDINGS: No active infiltrate or effusion is seen. Mediastinal and hilar contours are unremarkable. The heart is within normal limits in size. No bony abnormality is seen. IMPRESSION: No active cardiopulmonary disease. Electronically Signed   By: Ivar Drape M.D.   On: 12/31/2015 09:15   Results for orders placed or performed in visit on 12/31/15 (from the past 24 hour(s))  POCT CBC     Status: Abnormal   Collection Time: 12/31/15  9:14 AM  Result Value Ref Range   WBC 7.3 4.6 - 10.2 K/uL   Lymph, poc 1.3 0.6 - 3.4   POC LYMPH PERCENT 18.2 10 - 50 %L   MID (cbc) 0.4 0 - 0.9   POC MID % 5.1 0 - 12 %M   POC Granulocyte 5.6 2 - 6.9   Granulocyte percent 76.7 37 - 80 %G   RBC 5.30 4.69 - 6.13 M/uL   Hemoglobin 18.2 (A) 14.1 - 18.1 g/dL   HCT, POC 49.4 43.5 - 53.7 %   MCV 93.2 80 - 97 fL   MCH, POC 34.3 (A) 27 - 31.2 pg   MCHC 36.8 (A) 31.8 - 35.4 g/dL   RDW, POC 12.9 %   Platelet Count, POC 191 142 - 424 K/uL   MPV 6.2 0 - 99.8 fL   Assessment and Plan :   1.  Cough 2. Atypical chest pain 3. Tobacco use disorder 4. Lower respiratory infection 5. Sore throat - Will cover for infectious process with Azithromycin given symptom set. Start albuterol for his smoking and cough. Use hycodan as needed for cough and sore throat. Patient is to call or rtc in 1 week if no improvement, consider using Levaquin at that point.  Jaynee Eagles, PA-C [facility] [phone] 12/31/2015 8:53 AM

## 2016-01-01 ENCOUNTER — Ambulatory Visit (INDEPENDENT_AMBULATORY_CARE_PROVIDER_SITE_OTHER): Payer: 59 | Admitting: Pharmacist Clinician (PhC)/ Clinical Pharmacy Specialist

## 2016-01-01 ENCOUNTER — Encounter: Payer: Self-pay | Admitting: Pharmacist Clinician (PhC)/ Clinical Pharmacy Specialist

## 2016-01-01 VITALS — BP 180/96 | HR 100 | Ht 72.0 in | Wt 179.9 lb

## 2016-01-01 DIAGNOSIS — I10 Essential (primary) hypertension: Secondary | ICD-10-CM

## 2016-01-01 MED ORDER — LISINOPRIL 10 MG PO TABS: 10.0000 mg | ORAL_TABLET | Freq: Every day | ORAL | Status: DC

## 2016-01-01 NOTE — Patient Instructions (Signed)
Return for a a follow up appointment in 3 weeks  Your blood pressure today is 180/96 (goal is < 140/90)  Check your BP at home several times each week and keep record of the readings.  Take your BP meds as follows: continue amlodipine 5 mg daily, increase lisinopril to 10 mg daily (take 2 of the 5 mg tablets daily until they are gone)  Bring all of your meds, your BP cuff and your record of home blood pressures to your next appointment.  Exercise as you're able, try to walk approximately 30 minutes per day.  Keep salt intake to a minimum, especially watch canned and prepared boxed foods.  Eat more fresh fruits and vegetables and fewer canned items.  Avoid eating in fast food restaurants.    HOW TO TAKE YOUR BLOOD PRESSURE: . Rest 5 minutes before taking your blood pressure. .  Don't smoke or drink caffeinated beverages for at least 30 minutes before. . Take your blood pressure before (not after) you eat. . Sit comfortably with your back supported and both feet on the floor (don't cross your legs). . Elevate your arm to heart level on a table or a desk. . Use the proper sized cuff. It should fit smoothly and snugly around your bare upper arm. There should be enough room to slip a fingertip under the cuff. The bottom edge of the cuff should be 1 inch above the crease of the elbow. . Ideally, take 3 measurements at one sitting and record the average.

## 2016-01-01 NOTE — Progress Notes (Signed)
01/01/2016 James Stokes [facility] 29-Mar-1956 [id number]   HPI:  James Stokes is a 60 y.o. male patient of Dr Gwenlyn Found, with a PMH below who presents today for hypertension clinic evaluation.  When he saw Dr. Gwenlyn Found on March 7 he was switched from verapamil 120 mg tid to amlodipine 5 mg qd and lisinopril 5 mg qd.  He admits to much better compliance with once daily medications.  Yesterday he was seen by his primary provider for cough and respiratory infection.  His BP in their office was recorded at 160/94.  In addition to a Zpak and cough syrup, he was given prednisone 40 mg qd x 5 and an albuterol inhaler.  Today he feels much better.  Cardiac Hx: hypertension, hyperlipidemia, tobacco abuse  Family Hx: father died from aneurysm at 37; mother still living at 73, no history of hypertension  Social Hx: smokes daily, admits to down from 2-2.5 ppd to 1.5 ppd in past week, because of breathing problems with respiratory infection; drinks alcohol daily; coffee most mornings as well as 1 can of soda daily on work days  Diet: admits to poor diet.  Eats fast food most days breakfast and lunch.  Today had sausage biscuit for breakfast, fried chicken biscuit and gravy for lunch.  States eats most dinners at home, but does not add salt to much at home.    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 albuterol (PROVENTIL HFA;VENTOLIN HFA) 108 (90 Base) MCG/ACT inhaler Inhale 2 puffs into the lungs every 6 (six) hours as needed for wheezing or shortness of breath (cough, shortness of breath or wheezing.). 1 Inhaler 1  . amLODipine (NORVASC) 5 MG tablet Take 1 tablet (5 mg total) by mouth daily. 90 tablet 3  . aspirin 81 MG tablet Take 81 mg by mouth daily.    Marland Kitchen azithromycin (ZITHROMAX) 250 MG tablet Start with 2 tablets today, then 1 daily thereafter. 6 tablet 0  . HYDROcodone-homatropine (HYCODAN) 5-1.5 MG/5ML syrup Take 5 mLs by mouth at bedtime as needed. 120 mL 0  . lisinopril (PRINIVIL,ZESTRIL) 10 MG tablet Take 1 tablet (10 mg total) by mouth daily. 30 tablet 3  . Multiple Vitamins-Minerals (MULTIVITAMIN ADULT PO) Take 1 tablet by mouth daily.    . predniSONE (DELTASONE) 20 MG tablet Take 2 tablets daily with breakfast. 10 tablet 0   No current facility-administered medications for this visit.    Allergies  Allergen Reactions  . Other     Seafood - causes itching    Past Medical History  Diagnosis Date  . HTN (hypertension)   . Dyslipidemia   . Smoker   . ETOH abuse   . Kidney stones   . Hypertriglyceridemia     Blood pressure 180/96, pulse 100, height 6' (1.829 m),  weight 179 lb 14.4 oz (81.602 kg).    Tommy Medal PharmD CPP [facility]

## 2016-01-01 NOTE — Assessment & Plan Note (Signed)
Today his BP is quite elevated in the office at 180/96.  Some of this elevation could be in part because of the prednisone and albuterol, but they are not enough to account for such a high number.  I am going to have him increase the lisinopril to 10 mg daily, continue with amlodipine 5 mg and see him back in 3 weeks.  In the meantime, I encouraged him to stay at no more than 1.5 ppd on his cigarettes, and to have at least 2 days per week that he avoids fast foods/fried foods completely.

## 2016-01-27 ENCOUNTER — Ambulatory Visit (INDEPENDENT_AMBULATORY_CARE_PROVIDER_SITE_OTHER): Payer: 59 | Admitting: Pharmacist

## 2016-01-27 VITALS — BP 158/94 | Wt 162.8 lb

## 2016-01-27 DIAGNOSIS — I10 Essential (primary) hypertension: Secondary | ICD-10-CM | POA: Diagnosis not present

## 2016-01-27 MED ORDER — LISINOPRIL 20 MG PO TABS: 20.0000 mg | ORAL_TABLET | Freq: Every day | ORAL | Status: DC

## 2016-01-27 NOTE — Assessment & Plan Note (Signed)
BP is not at goal today in the office, but significantly improved from last visit. Will increase lisinopril to 20mg  daily. Encouraged him to continue to try to make better choices as far as eating out (grilled chicken instead of fried; salad occasionally instead of fries); also encouraged him to at least think about cutting back on cigarettes including what it would take for him to do that. Will see him back for a follow-up blood pressure check in 4 weeks

## 2016-01-27 NOTE — Patient Instructions (Signed)
Return for a a follow up appointment in 4 weeks  Your blood pressure today is 158/94  Check your blood pressure at home daily (if able) and keep record of the readings.  Take your BP meds as follows: Increase Lisinopril to 20mg  once a day   Try to get to [facility] or [facility] and have your blood pressure checked a few times a week.  Bring all of your meds, your BP cuff and your record of home blood pressures to your next appointment.  Exercise as you're able, try to walk approximately 30 minutes per day.  Keep salt intake to a minimum, especially watch canned and prepared boxed foods.  Eat more fresh fruits and vegetables and fewer canned items.  Avoid eating in fast food restaurants.    HOW TO TAKE YOUR BLOOD PRESSURE: . Rest 5 minutes before taking your blood pressure. .  Don't smoke or drink caffeinated beverages for at least 30 minutes before. . Take your blood pressure before (not after) you eat. . Sit comfortably with your back supported and both feet on the floor (don't cross your legs). . Elevate your arm to heart level on a table or a desk. . Use the proper sized cuff. It should fit smoothly and snugly around your bare upper arm. There should be enough room to slip a fingertip under the cuff. The bottom edge of the cuff should be 1 inch above the crease of the elbow. . Ideally, take 3 measurements at one sitting and record the average.

## 2016-01-27 NOTE — Progress Notes (Signed)
Patient ID: James Stokes                 DOB: August 04, 1956                      MRN: [id number]     HPI: James Stokes is a 60 y.o. male patient of Dr Gwenlyn Found, with a PMH below who presents today for hypertension clinic follow-up. At his last visit his lisinopril 5mg  was increased to 10mg . He has also completed his prednisone pack and albuterol for respiratory infection. He says he feels much better than the last time he was here.   Cardiac Hx: hypertension, hyperlipidemia, tobacco abuse  Family Hx: father died from aneurysm at 40; mother still living at 75, no history of hypertension  Social Hx: smokes daily, admits to smoking about 2 ppd - he says he has not even contemplated quitting; drinks alcohol daily; coffee most mornings as well as 1 can of soda daily on work days  Diet: admits to poor diet. Eats fast food most days breakfast and lunch. He states he has tried to make better choices at the fast food restaurants in the last few days.   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  Past Medical History  Diagnosis Date  . HTN (hypertension)   . Dyslipidemia   . Smoker   . ETOH abuse   . Kidney stones   . Hypertriglyceridemia     Current Outpatient Prescriptions on File Prior to Visit  Medication Sig Dispense Refill  . albuterol (PROVENTIL  HFA;VENTOLIN HFA) 108 (90 Base) MCG/ACT inhaler Inhale 2 puffs into the lungs every 6 (six) hours as needed for wheezing or shortness of breath (cough, shortness of breath or wheezing.). 1 Inhaler 1  . amLODipine (NORVASC) 5 MG tablet Take 1 tablet (5 mg total) by mouth daily. 90 tablet 3  . aspirin 81 MG tablet Take 81 mg by mouth daily.    Marland Kitchen azithromycin (ZITHROMAX) 250 MG tablet Start with 2 tablets today, then 1 daily thereafter. 6 tablet 0  . HYDROcodone-homatropine (HYCODAN) 5-1.5 MG/5ML syrup Take 5 mLs by mouth at bedtime as needed. 120 mL 0  . Multiple Vitamins-Minerals (MULTIVITAMIN ADULT PO) Take 1 tablet by mouth daily.    . predniSONE (DELTASONE) 20 MG tablet Take 2 tablets daily with breakfast. 10 tablet 0   No current facility-administered medications on file prior to visit.    Allergies  Allergen Reactions  . Other     Seafood - causes itching     Assessment/Plan: Hypertension: BP is not at goal today in the office, but significantly improved from last visit. Will increase lisinopril to 20mg  daily. Encouraged him to continue to try to make better choices as far as eating  out (grilled chicken instead of fried; salad occasionally instead of fries); also encouraged him to at least think about cutting back on cigarettes including what it would take for him to do that. Will see him back for a follow-up blood pressure check in 4 weeks.   Thank you, Lelan Pons. Patterson Hammersmith, [facility]  01/27/2016 3:42 PM

## 2016-03-02 ENCOUNTER — Ambulatory Visit (INDEPENDENT_AMBULATORY_CARE_PROVIDER_SITE_OTHER): Payer: 59 | Admitting: Pharmacist

## 2016-03-02 VITALS — BP 150/90 | Wt 180.8 lb

## 2016-03-02 DIAGNOSIS — I10 Essential (primary) hypertension: Secondary | ICD-10-CM | POA: Diagnosis not present

## 2016-03-02 LAB — BASIC METABOLIC PANEL
BUN: 21 mg/dL (ref 7–25)
CALCIUM: 9.3 mg/dL (ref 8.6–10.3)
CO2: 26 mmol/L (ref 20–31)
Chloride: 105 mmol/L (ref 98–110)
Creat: 1.22 mg/dL (ref 0.70–1.33)
Glucose, Bld: 105 mg/dL — ABNORMAL HIGH (ref 65–99)
POTASSIUM: 4.1 mmol/L (ref 3.5–5.3)
SODIUM: 141 mmol/L (ref 135–146)

## 2016-03-02 MED ORDER — AMLODIPINE BESYLATE 10 MG PO TABS: 10.0000 mg | ORAL_TABLET | Freq: Every day | ORAL | Status: DC

## 2016-03-02 NOTE — Progress Notes (Signed)
Patient ID: James Stokes DOB: 26-Aug-1956 MRN: [id number]     HPI: James Stokes is a 60 y.o. male patient of Dr Gwenlyn Found, with a PMH below who presents today for hypertension clinic follow-up. At his last visit his lisinopril 10mg  was increased to 20mg .   He also reports he was previously tried on a 3 times a day medication and he stated that was a waste of time because he will not take it three times a day due to his work schedule. He reports compliance with lisinopril and amlodipine because he is able to take them before he leaves for work.   He reports he has been feeling well since this medication change. He does report muscle cramps which he does not necessarily associate with the medication but has noticed they are "pretty bad" of late. He reports they are off and on.   Cardiac Hx: hypertension, hyperlipidemia, tobacco abuse  Family Hx: father died from aneurysm at 53; mother still living at 48, no history of hypertension  Social Hx: smokes daily, admits to smoking about 2 ppd - He says he has at least thought about quitting now but that's as far as he has gotten; drinks alcohol daily; coffee most mornings as well as 1 can of soda daily on work days.  He states he drinks coffee until 10am, then water until 5 pm with one soda, then beer until he goes to bed (3-10 cans)  Diet: admits to poor diet. He has made improvements since his last visit by replacing his morning sausage biscuit with granola bars occasionally and decreasing the number of hamburgers he is eating.   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 One of the numbers on the machine he states was "astronomically high." He does not remember specifically what   Current antihypertensive medications: amlodipine 5 mg daily,  lisinopril 20mg  daily  Previously tried: verapamil, losartan/HCTZ   Wt Readings from Last 3 Encounters:  01/27/16 162 lb 12.8 oz (73.846 kg)  01/01/16 179 lb 14.4 oz (81.602 kg)  12/31/15 179 lb 9.6 oz (81.466 kg)   BP Readings from Last 3 Encounters:  03/02/16 150/90  01/27/16 158/94  01/01/16 180/96   Pulse Readings from Last 3 Encounters:  01/01/16 100  12/31/15 113  12/31/15 126    Renal function: CrCl cannot be calculated (Unknown ideal weight.).  Past Medical History  Diagnosis Date  . HTN (hypertension)   . Dyslipidemia   . Smoker   . ETOH abuse   . Kidney stones   . Hypertriglyceridemia     Current Outpatient Prescriptions on File Prior to Visit  Medication Sig Dispense Refill  . albuterol (PROVENTIL HFA;VENTOLIN HFA) 108 (90 Base) MCG/ACT inhaler Inhale 2 puffs into the lungs every 6 (six) hours as needed for wheezing or shortness of breath (cough, shortness of breath or wheezing.). 1 Inhaler 1  . aspirin 81 MG tablet Take 81 mg by mouth daily.    Marland Kitchen lisinopril (PRINIVIL,ZESTRIL) 20 MG tablet Take 1 tablet (20 mg total) by mouth daily. 90 tablet 3  . Multiple Vitamins-Minerals (MULTIVITAMIN ADULT PO) Take 1 tablet by mouth daily.     No current facility-administered medications on file prior to visit.    Allergies  Allergen Reactions  . Other     Seafood -  causes itching     Assessment/Plan: Hypertension: BP is not at goal today and remains essentially unchanged since last visit. Will increase his amlodipine dose to 10mg  daily. Have him follow-up in 2 months in hypertension clinic. BMET today since patient having cramps and medication changes recently though I suspect cramping is likely secondary to dehydration as the patient endorses high caffeine intake as well as high intake of alcohol.    Thank you, Lelan Pons. Patterson Hammersmith, [facility]  03/02/2016 3:50 PM

## 2016-03-02 NOTE — Patient Instructions (Signed)
Return for a a follow up appointment in 2 months with blood pressure clinic  Your blood pressure today is 150/90   Check your blood pressure at home daily (if able) and keep record of the readings.  Take your BP meds as follows: Lisinopril 20mg  daily Amlodipine 10mg  daily  BRING CUFF TO NEXT APPT  Bring all of your meds, your BP cuff and your record of home blood pressures to your next appointment.  Exercise as you're able, try to walk approximately 30 minutes per day.  Keep salt intake to a minimum, especially watch canned and prepared boxed foods.  Eat more fresh fruits and vegetables and fewer canned items.  Avoid eating in fast food restaurants.    HOW TO TAKE YOUR BLOOD PRESSURE: . Rest 5 minutes before taking your blood pressure. .  Don't smoke or drink caffeinated beverages for at least 30 minutes before. . Take your blood pressure before (not after) you eat. . Sit comfortably with your back supported and both feet on the floor (don't cross your legs). . Elevate your arm to heart level on a table or a desk. . Use the proper sized cuff. It should fit smoothly and snugly around your bare upper arm. There should be enough room to slip a fingertip under the cuff. The bottom edge of the cuff should be 1 inch above the crease of the elbow. . Ideally, take 3 measurements at one sitting and record the average.

## 2016-03-02 NOTE — Assessment & Plan Note (Signed)
BP is not at goal today and remains essentially unchanged since last visit. Will increase his amlodipine dose to 10mg  daily. Have him follow-up in 2 months in hypertension clinic. BMET today since patient having cramps and medication changes recently though I suspect cramping is likely secondary to dehydration as the patient endorses high caffeine intake as well as high intake of alcohol.

## 2016-05-05 ENCOUNTER — Telehealth: Payer: Self-pay | Admitting: Pharmacist

## 2016-05-05 NOTE — Telephone Encounter (Signed)
To schedule BP follow up appt as discussed in office visit in June

## 2017-01-25 ENCOUNTER — Ambulatory Visit (INDEPENDENT_AMBULATORY_CARE_PROVIDER_SITE_OTHER): Payer: 59 | Admitting: Cardiovascular Disease

## 2017-01-25 ENCOUNTER — Encounter: Payer: Self-pay | Admitting: Cardiovascular Disease

## 2017-01-25 VITALS — BP 158/90 | HR 98 | Ht 71.0 in | Wt 181.6 lb

## 2017-01-25 DIAGNOSIS — Z79899 Other long term (current) drug therapy: Secondary | ICD-10-CM

## 2017-01-25 DIAGNOSIS — I10 Essential (primary) hypertension: Secondary | ICD-10-CM | POA: Diagnosis not present

## 2017-01-25 DIAGNOSIS — E785 Hyperlipidemia, unspecified: Secondary | ICD-10-CM

## 2017-01-25 LAB — CBC
HCT: 53.3 % — ABNORMAL HIGH (ref 38.5–50.0)
HEMOGLOBIN: 18.7 g/dL — AB (ref 13.2–17.1)
MCH: 33.6 pg — AB (ref 27.0–33.0)
MCHC: 35.1 g/dL (ref 32.0–36.0)
MCV: 95.9 fL (ref 80.0–100.0)
MPV: 9.4 fL (ref 7.5–12.5)
PLATELETS: 198 10*3/uL (ref 140–400)
RBC: 5.56 MIL/uL (ref 4.20–5.80)
RDW: 13.2 % (ref 11.0–15.0)
WBC: 8.7 10*3/uL (ref 3.8–10.8)

## 2017-01-25 LAB — T4, FREE: Free T4: 1.3 ng/dL (ref 0.8–1.8)

## 2017-01-25 LAB — TSH: TSH: 1.59 m[IU]/L (ref 0.40–4.50)

## 2017-01-25 NOTE — Progress Notes (Signed)
01/25/2017 James Stokes   [birth year]  [id number]  Primary Physician No PCP Per Patient Primary Cardiologist: Lorretta Harp MD Renae Gloss  HPI:  The patient is a 61 year old, mildly overweight, married Caucasian male, father of 3 who I last saw 12/02/15.Marland Kitchen He has a history of hypertension and hyperlipidemia. He does smoke 2 packs a day and drinks 3 to 5 beers a night. He has hypertriglyceridemia as well. His last Myoview performed May 06, 2004, was nonischemic. Since I last saw him a year ago he denies chest pain or significantly changed breathing pattern. Since I saw him approximately 1 yearsago he denies chest pain or shortness of breath. Does continue to smoke one to 2 packs a day and drinks 3-5 recalcitrant risk factor modification.   Current Outpatient Prescriptions  Medication Sig Dispense Refill  . albuterol (PROVENTIL HFA;VENTOLIN HFA) 108 (90 Base) MCG/ACT inhaler Inhale 2 puffs into the lungs every 6 (six) hours as needed for wheezing or shortness of breath (cough, shortness of breath or wheezing.). 1 Inhaler 1  . amLODipine (NORVASC) 10 MG tablet Take 1 tablet (10 mg total) by mouth daily. 180 tablet 3  . aspirin 81 MG tablet Take 81 mg by mouth daily.    Marland Kitchen lisinopril (PRINIVIL,ZESTRIL) 20 MG tablet Take 1 tablet (20 mg total) by mouth daily. 90 tablet 3  . Multiple Vitamins-Minerals (MULTIVITAMIN ADULT PO) Take 1 tablet by mouth daily.     No current facility-administered medications for this visit.     Allergies  Allergen Reactions  . Other     Seafood - causes itching    Social History   Social History  . Marital status: Married    Spouse name: N/A  . Number of children: N/A  . Years of education: N/A   Occupational History  . Not on file.   Social History Main Topics  . Smoking status: Current Every Day Smoker    Packs/day: 1.50    Years: 43.00    Types: Cigarettes  . Smokeless tobacco: Former Systems developer  . Alcohol use 9.0 oz/week    15  Cans of beer per week  . Drug use: Yes    Types: Marijuana  . Sexual activity: Not on file   Other Topics Concern  . Not on file   Social History Narrative  . No narrative on file     Review of Systems: General: negative for chills, fever, night sweats or weight changes.  Cardiovascular: negative for chest pain, dyspnea on exertion, edema, orthopnea, palpitations, paroxysmal nocturnal dyspnea or shortness of breath Dermatological: negative for rash Respiratory: negative for cough or wheezing Urologic: negative for hematuria Abdominal: negative for nausea, vomiting, diarrhea, bright red blood per rectum, melena, or hematemesis Neurologic: negative for visual changes, syncope, or dizziness All other systems reviewed and are otherwise negative except as noted above.    Blood pressure (!) 158/90, pulse 98, height 5\' 11"  (1.803 m), weight 181 lb 9.6 oz (82.4 kg).  General appearance: alert and no distress Neck: no adenopathy, no carotid bruit, no JVD, supple, symmetrical, trachea midline and thyroid not enlarged, symmetric, no tenderness/mass/nodules Lungs: clear to auscultation bilaterally Heart: regular rate and rhythm, S1, S2 normal, no murmur, click, rub or gallop Extremities: extremities normal, atraumatic, no cyanosis or edema  EKG sinus rhythm at 98 with septal Q waves and right  axis deviation. I personally reviewed this EKG.  ASSESSMENT AND PLAN:   Essential hypertension History of essential hypertension  with blood pressures measured 158/90. He is on amlodipine and lisinopril. He does admit to dietary indiscretion with regards to salt.  Hyperlipidemia History of hyperlipidemia and hypertriglyceridemia not on statin therapy. He does admit to dietary indiscretion. Will recheck a lipid and liver profile. He may need to be on a statin and/or fenofibrate.  Tobacco abuse History tobacco abuse currently smoking 2 packs per day. Recalcitrant to risk factor modification  ETOH  abuse History of ethanol abuse currently drinking 4-6 beers a night recalcitrant risk factor modification      Lorretta Harp MD [facility], [facility] 01/25/2017 8:23 AM

## 2017-01-25 NOTE — Assessment & Plan Note (Signed)
History of essential hypertension with blood pressures measured 158/90. He is on amlodipine and lisinopril. He does admit to dietary indiscretion with regards to salt.

## 2017-01-25 NOTE — Assessment & Plan Note (Signed)
History of ethanol abuse currently drinking 4-6 beers a night recalcitrant risk factor modification

## 2017-01-25 NOTE — Assessment & Plan Note (Signed)
History of hyperlipidemia and hypertriglyceridemia not on statin therapy. He does admit to dietary indiscretion. Will recheck a lipid and liver profile. He may need to be on a statin and/or fenofibrate.

## 2017-01-25 NOTE — Patient Instructions (Signed)
Medication Instructions: no changes   Labwork: today at [facility] - this is a fasting test   Testing/Procedures: none   Follow-Up: 1 year with Dr. Gwenlyn Found     If you need a refill on your cardiac medications before your next appointment, please call your pharmacy.

## 2017-01-25 NOTE — Assessment & Plan Note (Signed)
History tobacco abuse currently smoking 2 packs per day. Recalcitrant to risk factor modification

## 2017-01-26 LAB — HEPATIC FUNCTION PANEL
ALK PHOS: 80 U/L (ref 40–115)
ALT: 28 U/L (ref 9–46)
AST: 21 U/L (ref 10–35)
Albumin: 4 g/dL (ref 3.6–5.1)
BILIRUBIN DIRECT: 0.1 mg/dL (ref <=0.2)
BILIRUBIN INDIRECT: 0.4 mg/dL (ref 0.2–1.2)
TOTAL PROTEIN: 6.9 g/dL (ref 6.1–8.1)
Total Bilirubin: 0.5 mg/dL (ref 0.2–1.2)

## 2017-01-26 LAB — BASIC METABOLIC PANEL
BUN: 14 mg/dL (ref 7–25)
CO2: 24 mmol/L (ref 20–31)
Calcium: 9.1 mg/dL (ref 8.6–10.3)
Chloride: 102 mmol/L (ref 98–110)
Creat: 1.19 mg/dL (ref 0.70–1.33)
Glucose, Bld: 91 mg/dL (ref 65–99)
POTASSIUM: 4.4 mmol/L (ref 3.5–5.3)
SODIUM: 138 mmol/L (ref 135–146)

## 2017-01-26 LAB — LIPID PANEL
CHOLESTEROL: 244 mg/dL — AB (ref <200)
HDL: 36 mg/dL — ABNORMAL LOW (ref >40)
TRIGLYCERIDES: 695 mg/dL — AB (ref <150)
Total CHOL/HDL Ratio: 6.8 Ratio — ABNORMAL HIGH (ref <5.0)

## 2017-01-26 LAB — HEMOGLOBIN A1C
HEMOGLOBIN A1C: 5 % (ref <5.7)
MEAN PLASMA GLUCOSE: 97 mg/dL

## 2017-01-26 LAB — LDL CHOLESTEROL, DIRECT: Direct LDL: 75 mg/dL (ref <130)

## 2017-01-26 LAB — PSA: PSA: 0.9 ng/mL (ref <=4.0)

## 2017-01-30 ENCOUNTER — Other Ambulatory Visit: Payer: Self-pay | Admitting: Cardiovascular Disease

## 2017-04-04 ENCOUNTER — Other Ambulatory Visit: Payer: Self-pay | Admitting: Cardiovascular Disease

## 2017-07-08 ENCOUNTER — Encounter (HOSPITAL_COMMUNITY): Payer: Self-pay | Admitting: Emergency Medicine

## 2017-07-08 ENCOUNTER — Emergency Department (HOSPITAL_COMMUNITY): Payer: 59

## 2017-07-08 ENCOUNTER — Emergency Department (HOSPITAL_COMMUNITY)
Admission: EM | Admit: 2017-07-08 | Discharge: 2017-07-08 | Disposition: A | Discharge status: Home / Self Care | Payer: 59 | Attending: Emergency Medicine | Admitting: Emergency Medicine

## 2017-07-08 DIAGNOSIS — E785 Hyperlipidemia, unspecified: Secondary | ICD-10-CM | POA: Diagnosis not present

## 2017-07-08 DIAGNOSIS — Z79899 Other long term (current) drug therapy: Secondary | ICD-10-CM | POA: Diagnosis not present

## 2017-07-08 DIAGNOSIS — F17210 Nicotine dependence, cigarettes, uncomplicated: Secondary | ICD-10-CM | POA: Insufficient documentation

## 2017-07-08 DIAGNOSIS — E669 Obesity, unspecified: Secondary | ICD-10-CM | POA: Insufficient documentation

## 2017-07-08 DIAGNOSIS — R0789 Other chest pain: Secondary | ICD-10-CM

## 2017-07-08 DIAGNOSIS — R079 Chest pain, unspecified: Secondary | ICD-10-CM | POA: Diagnosis present

## 2017-07-08 DIAGNOSIS — I10 Essential (primary) hypertension: Secondary | ICD-10-CM | POA: Diagnosis not present

## 2017-07-08 LAB — BASIC METABOLIC PANEL
ANION GAP: 10 (ref 5–15)
BUN: 14 mg/dL (ref 6–20)
CALCIUM: 9.5 mg/dL (ref 8.9–10.3)
CHLORIDE: 100 mmol/L — AB (ref 101–111)
CO2: 29 mmol/L (ref 22–32)
Creatinine, Ser: 1.14 mg/dL (ref 0.61–1.24)
GFR calc non Af Amer: >60 mL/min (ref >60)
Glucose, Bld: 118 mg/dL — ABNORMAL HIGH (ref 65–99)
Potassium: 3.9 mmol/L (ref 3.5–5.1)
SODIUM: 139 mmol/L (ref 135–145)

## 2017-07-08 LAB — I-STAT TROPONIN, ED
TROPONIN I, POC: 0 ng/mL (ref 0.00–0.08)
TROPONIN I, POC: 0 ng/mL (ref 0.00–0.08)

## 2017-07-08 LAB — CBC
HCT: 55.1 % — ABNORMAL HIGH (ref 39.0–52.0)
HEMOGLOBIN: 19.6 g/dL — AB (ref 13.0–17.0)
MCH: 34.4 pg — AB (ref 26.0–34.0)
MCHC: 35.6 g/dL (ref 30.0–36.0)
MCV: 96.7 fL (ref 78.0–100.0)
PLATELETS: 186 10*3/uL (ref 150–400)
RBC: 5.7 MIL/uL (ref 4.22–5.81)
RDW: 12.8 % (ref 11.5–15.5)
WBC: 7.4 10*3/uL (ref 4.0–10.5)

## 2017-07-08 MED ORDER — NITROGLYCERIN 0.4 MG SL SUBL
0.4000 mg | SUBLINGUAL_TABLET | SUBLINGUAL | Status: DC | PRN
  Administered 2017-07-08 (×3): 0.4 mg via SUBLINGUAL
  Filled 2017-07-08: qty 1

## 2017-07-08 NOTE — ED Provider Notes (Signed)
[facility] DEPT Provider Note   CSN: [id number] Arrival date & time: 07/08/17  2202     History   Chief Complaint Chief Complaint  Patient presents with  . Chest Pain    HPI James Stokes is a 61 y.o. male with h/o ETOH abuse, tobacco abuse, hypertension, hyperlipidemia presents with constant, non pleuritic, non exertional left sided chest pain described as "ache" since 0400 today.  Associated symptoms include intermittent palpitations. CP started while drinking coffe/at rest. Has not tried anything for pain PTA. Went to reach for a magazine in waiting room and pain was exacerbated, "felt like a pull". No alleviating factors, currently having CP. Denies fevers, chills, nausea, vomiting, light-headedness, exertional dyspnea, PND, orthopnea. Has had symmetrical LE edema for 1+ year, unchanged. Chronic cough, unchanged.  2 ppd. 6-10 beer a night. Occasional marijuana.   HPI  Past Medical History:  Diagnosis Date  . Dyslipidemia   . ETOH abuse   . HTN (hypertension)   . Hypertriglyceridemia   . Kidney stones   . Smoker     Patient Active Problem List   Diagnosis Date Noted  . ETOH abuse 07/17/2014  . Current smoker 07/17/2014  . Essential hypertension 05/16/2013  . Hyperlipidemia 05/16/2013  . Tobacco abuse 05/16/2013    Past Surgical History:  Procedure Laterality Date  . HERNIA REPAIR     pt was 18 month old  . HERNIA REPAIR     double hernia pt unsure of exact date, DR. Ballen did surgery  . lipoma  05/1989   fatty tumor on back       Home Medications    Prior to Admission medications   Medication Sig Start Date End Date Taking? Authorizing Provider  albuterol (PROVENTIL HFA;VENTOLIN HFA) 108 (90 Base) MCG/ACT inhaler Inhale 2 puffs into the lungs every 6 (six) hours as needed for wheezing or shortness of breath (cough, shortness of breath or wheezing.). 12/31/15  Yes Jaynee Eagles, PA-C  amLODipine (NORVASC) 10 MG tablet TAKE 1 TABLET BY MOUTH EVERY DAY  04/04/17  Yes Lorretta Harp, MD  aspirin 81 MG tablet Take 81 mg by mouth daily.   Yes [provider]  lisinopril (PRINIVIL,ZESTRIL) 20 MG tablet Take 1 tablet (20 mg total) by mouth daily. 01/31/17  Yes Lorretta Harp, MD  Multiple Vitamins-Minerals (MULTIVITAMIN ADULT PO) Take 1 tablet by mouth daily.   Yes [provider]    Family History Family History  Problem Relation Age of Onset  . COPD Mother   . Vascular Disease Father 67       aneurysm  . Hyperlipidemia Father   . Hypertension Father   . Cancer Father   . Diabetes Paternal Aunt   . Diabetes Paternal Uncle     Social History Social History  Substance Use Topics  . Smoking status: Current Every Day Smoker    Packs/day: 1.50    Years: 43.00    Types: Cigarettes  . Smokeless tobacco: Former Systems developer    Types: Chew  . Alcohol use 9.0 oz/week    15 Cans of beer per week     Comment: "6-10 beers a night"     Allergies   Other and Shrimp [shellfish allergy]   Review of Systems Review of Systems  Constitutional: Negative for chills and fever.  Respiratory: Positive for cough (chronic). Negative for shortness of breath and wheezing.   Cardiovascular: Positive for chest pain, palpitations and leg swelling (chronic).  Gastrointestinal: Negative for abdominal pain,  nausea and vomiting.  Genitourinary: Negative for difficulty urinating.  Musculoskeletal: Negative for back pain.  Neurological: Negative for dizziness and light-headedness.  Hematological: Does not bruise/bleed easily.     Physical Exam Updated Vital Signs BP (!) 153/96   Pulse 73   Temp 98.2 F (36.8 C) (Oral)   Resp (!) 23   SpO2 92%   Physical Exam  Constitutional: He appears well-developed and well-nourished.  NAD.  HENT:  Head: Normocephalic and atraumatic.  Nose: Nose normal.  Moist mucous membranes Tonsils and oropharynx normal  Eyes: Conjunctivae, EOM and lids are normal.  Neck: Trachea normal and normal range  of motion.  Neck is supple Trachea midline No cervical adenopathy  Cardiovascular: Regular rhythm, S1 normal, S2 normal and normal heart sounds.  Tachycardia present.   Pulses:      Carotid pulses are 2+ on the right side, and 2+ on the left side.      Radial pulses are 2+ on the right side, and 2+ on the left side.       Dorsalis pedis pulses are 2+ on the right side, and 2+ on the left side.  No S3 No orthopnea No LE edema No carotid bruits  Pulmonary/Chest: Effort normal and breath sounds normal. No respiratory distress. He has no decreased breath sounds. He has no wheezes. He has no rhonchi. He has no rales.  No chest wall tenderness CP not reproducible with AROM of upper extremities  Abdominal: Soft. Bowel sounds are normal. There is no tenderness.  No epigastric tenderness  Lymphadenopathy:    He has no cervical adenopathy.  Neurological: He is alert. GCS eye subscore is 4. GCS verbal subscore is 5. GCS motor subscore is 6.  Skin: Skin is warm and dry. Capillary refill takes less than 2 seconds.  Psychiatric: He has a normal mood and affect. His speech is normal and behavior is normal. Judgment and thought content normal. Cognition and memory are normal.     ED Treatments / Results  Labs (all labs ordered are listed, but only abnormal results are displayed) Labs Reviewed  BASIC METABOLIC PANEL - Abnormal; Notable for the following:       Result Value   Chloride 100 (*)    Glucose, Bld 118 (*)    All other components within normal limits  CBC - Abnormal; Notable for the following:    Hemoglobin 19.6 (*)    HCT 55.1 (*)    MCH 34.4 (*)    All other components within normal limits  I-STAT TROPONIN, ED  I-STAT TROPONIN, ED    EKG  EKG Interpretation  Date/Time:  Friday July 08 2017 09:22:45 EDT Ventricular Rate:  102 PR Interval:  154 QRS Duration: 98 QT Interval:  358 QTC Calculation: 466 R Axis:   127 Text Interpretation:  Sinus tachycardia Right axis  deviation Incomplete right bundle branch block Abnormal ECG Confirmed by Nat Christen [phone]) on 07/08/2017 1:04:27 PM       Radiology Dg Chest 2 View  Result Date: 07/08/2017 CLINICAL DATA:  Chest pain. EXAM: CHEST  2 VIEW COMPARISON:  Radiographs of December 31, 2015. FINDINGS: The heart size and mediastinal contours are within normal limits. Both lungs are clear. No pneumothorax or pleural effusion is noted. The visualized skeletal structures are unremarkable. IMPRESSION: No active cardiopulmonary disease. Electronically Signed   By: Marijo Conception, M.D.   On: 07/08/2017 10:19    Procedures Procedures (including critical care time)  Medications Ordered in ED Medications  nitroGLYCERIN (NITROSTAT) SL tablet 0.4 mg (0.4 mg Sublingual Given 07/08/17 1229)     Initial Impression / Assessment and Plan / ED Course  I have reviewed the triage vital signs and the nursing notes.  Pertinent labs & imaging results that were available during my care of the patient were reviewed by me and considered in my medical decision making (see chart for details).  Clinical Course as of Jul 08 1612  Fri Jul 08, 2017  1008 Attempted to see pt, not in room yet  [CG]  1333 Discussed work up with patient. Recommended admission for CP rule out. He would like time to think about it, thinks it might be stress related.   [CG]    Clinical Course User Index [CG] Kinnie Feil, PA-C   61 yo male presents with constant left sided CP onset this morning. HEART score at least 3, given age, HTN, HLD, tobacco abuse, obesity. Symptoms have been constant however and non exertional and no pleuritic. No SOB.   CP exam is benign. Distal pulses 2+ bilaterally. No orthopnea, S3 or LE edema. No tachypnea. O2 sats were toward the low end of normal however pt was not symptomatic, I suspect heavy ETOH use may be contributing to chronic low Spo2. Pain went from a 3 to a 2 after nitro SL.   Lab work including delta trop, EKG  and CXR unremarkable. Elevated H/H consistent with chronic heavy tobacco abuse.   Final Clinical Impressions(s) / ED Diagnoses   Given HEART score, I recommended admission for CP rule out. He had a Myoview in 2005, non ischemic but no further cardiac work up since that I can find. Pt declined, he thinks pain may be stress or MSK. I discussed risks vs benefits of admission and discharge to try to persuade patient to accept admission. Pt was given time to consider his options. Ultimately he declined admission. Given risk factors pt is high risk for ACS. I doubt dissection, AAA or pulmonary embolism. 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 Encouraged pt to contact his cardiology ASAP for f/u. Discussed s/s that would warrant immediate return to ED for re-evaluation. Pt and relative at bedside verbalized understanding.   Final diagnoses:  Chest pain of uncertain etiology    New Prescriptions Discharge Medication List as of 07/08/2017  2:11 PM       Kinnie Feil, PA-C 07/08/17 1613    Nat Christen, MD 07/09/17 1537

## 2017-07-08 NOTE — Discharge Instructions (Signed)
You presented to the ED for left-sided chest discomfort. Your blood work including chest x-ray, EKG, heart enzymes and electrolytes are normal. Given your risk factors including tobacco and alcohol use, hypertension, elevated cholesterol and age we recommended admission for inpatient cardiac workup. You declined admission today and requested discharge to follow-up with your cardiologist as outpatient. Please monitor your chest discomfort. Return to the ED if chest discomfort becomes exertional or is associated with shortness of breath, dizziness, nausea, vomiting, palpitations or if it becomes concerning in any way. Continue taking all your medications as prescribed.

## 2017-07-08 NOTE — ED Triage Notes (Signed)
Pt reports new L sided CP radiating to LUE, denies n/v, dizziness, SOB.  Res e/u at this time.

## 2017-07-08 NOTE — ED Notes (Signed)
This RN went to get pt from lobby, pt being transported to Xray, pt visitor taken to Room

## 2018-01-26 ENCOUNTER — Other Ambulatory Visit: Payer: Self-pay | Admitting: Cardiovascular Disease

## 2018-01-26 NOTE — Telephone Encounter (Signed)
REFILL 

## 2018-03-24 ENCOUNTER — Encounter: Payer: Self-pay | Admitting: Cardiovascular Disease

## 2018-03-24 ENCOUNTER — Ambulatory Visit: Payer: 59 | Admitting: Cardiovascular Disease

## 2018-03-24 DIAGNOSIS — I10 Essential (primary) hypertension: Secondary | ICD-10-CM

## 2018-03-24 DIAGNOSIS — Z720 Tobacco use: Secondary | ICD-10-CM

## 2018-03-24 DIAGNOSIS — E785 Hyperlipidemia, unspecified: Secondary | ICD-10-CM | POA: Diagnosis not present

## 2018-03-24 LAB — HEPATIC FUNCTION PANEL
ALBUMIN: 4.5 g/dL (ref 3.5–5.5)
ALK PHOS: 90 IU/L (ref 39–117)
ALT: 32 IU/L (ref 0–44)
AST: 17 IU/L (ref 0–40)
BILIRUBIN TOTAL: 0.5 mg/dL (ref 0.0–1.2)
Bilirubin, Direct: 0.13 mg/dL (ref 0.00–0.40)
Total Protein: 6.5 g/dL (ref 6.0–8.5)

## 2018-03-24 LAB — LIPID PANEL
CHOLESTEROL TOTAL: 235 mg/dL — AB (ref 100–199)
Chol/HDL Ratio: 5.2 ratio — ABNORMAL HIGH (ref 0.0–5.0)
HDL: 45 mg/dL (ref >39)
Triglycerides: 423 mg/dL — ABNORMAL HIGH (ref 0–149)

## 2018-03-24 NOTE — Assessment & Plan Note (Signed)
History of ongoing tobacco abuse of 2 packs/day recalcitrant to risk factor modification. °

## 2018-03-24 NOTE — Assessment & Plan Note (Signed)
History of hyperlipidemia as well as hypertriglyceridemia probably related to dietary indiscretion and alcohol abuse. we will check a lipid liver profile today.

## 2018-03-24 NOTE — Assessment & Plan Note (Addendum)
History of essential hypertension with blood pressure measured at 138/92 he is on.  He has limit salt from his diet since we last met.  Continue current medications

## 2018-03-24 NOTE — Progress Notes (Signed)
03/24/2018 James Stokes   March 05, 1956  [id number]  Primary Physician Patient, No Pcp Per Primary Cardiologist: Lorretta Harp MD Garret Reddish, [place], [place]  HPI:  James Stokes is a 62 y.o.  mildly overweight, married Caucasian male, father of 3 who I last saw  01/25/2017.Marland Kitchen He has a history of hypertension and hyperlipidemia. He does smoke 2 packs a day and drinks 3 to 5 beers a night. He has hypertriglyceridemia as well. His last Myoview performed May 06, 2004, was nonischemic. Since I last saw him a year ago he denies chest pain or significantly changed breathing pattern. Since I saw him approximately 1 year ago he denies chest pain or shortness of breath.  He does continue to smoke one to 2 packs a day and drinks 3-5 recalcitrant risk factor modification.     Current Meds  Medication Sig  . albuterol (PROVENTIL HFA;VENTOLIN HFA) 108 (90 Base) MCG/ACT inhaler Inhale 2 puffs into the lungs every 6 (six) hours as needed for wheezing or shortness of breath (cough, shortness of breath or wheezing.).  Marland Kitchen amLODipine (NORVASC) 10 MG tablet TAKE 1 TABLET BY MOUTH EVERY DAY  . aspirin 81 MG tablet Take 81 mg by mouth daily.  Marland Kitchen lisinopril (PRINIVIL,ZESTRIL) 20 MG tablet Take 1 tablet (20 mg total) by mouth daily. NEED OV.  . Multiple Vitamins-Minerals (MULTIVITAMIN ADULT PO) Take 1 tablet by mouth daily.     Allergies  Allergen Reactions  . Other Itching    Seafood - causes itching  . Shrimp [Shellfish Allergy] Itching    Social History   Socioeconomic History  . Marital status: Married    Spouse name: Not on file  . Number of children: Not on file  . Years of education: Not on file  . Highest education level: Not on file  Occupational History  . Not on file  Social Needs  . Financial resource strain: Not on file  . Food insecurity:    Worry: Not on file    Inability: Not on file  . Transportation needs:    Medical: Not on file    Non-medical: Not on file    Tobacco Use  . Smoking status: Current Every Day Smoker    Packs/day: 1.50    Years: 43.00    Pack years: 64.50    Types: Cigarettes  . Smokeless tobacco: Former Systems developer    Types: Chew  Substance and Sexual Activity  . Alcohol use: Yes    Alcohol/week: 9.0 oz    Types: 15 Cans of beer per week    Comment: "6-10 beers a night"  . Drug use: Yes    Types: Marijuana  . Sexual activity: Not on file  Lifestyle  . Physical activity:    Days per week: Not on file    Minutes per session: Not on file  . Stress: Not on file  Relationships  . Social connections:    Talks on phone: Not on file    Gets together: Not on file    Attends religious service: Not on file    Active member of club or organization: Not on file    Attends meetings of clubs or organizations: Not on file    Relationship status: Not on file  . Intimate partner violence:    Fear of current or ex partner: Not on file    Emotionally abused: Not on file    Physically abused: Not on file    Forced sexual  activity: Not on file  Other Topics Concern  . Not on file  Social History Narrative  . Not on file     Review of Systems: General: negative for chills, fever, night sweats or weight changes.  Cardiovascular: negative for chest pain, dyspnea on exertion, edema, orthopnea, palpitations, paroxysmal nocturnal dyspnea or shortness of breath Dermatological: negative for rash Respiratory: negative for cough or wheezing Urologic: negative for hematuria Abdominal: negative for nausea, vomiting, diarrhea, bright red blood per rectum, melena, or hematemesis Neurologic: negative for visual changes, syncope, or dizziness All other systems reviewed and are otherwise negative except as noted above.    Blood pressure (!) 138/92, pulse (!) 102, height 5' 11"  (1.803 m), weight 179 lb (81.2 kg).  General appearance: alert and no distress Neck: no adenopathy, no carotid bruit, no JVD, supple, symmetrical, trachea midline and  thyroid not enlarged, symmetric, no tenderness/mass/nodules Lungs: clear to auscultation bilaterally Heart: regular rate and rhythm, S1, S2 normal, no murmur, click, rub or gallop Extremities: extremities normal, atraumatic, no cyanosis or edema Pulses: 2+ and symmetric Skin: Skin color, texture, turgor normal. No rashes or lesions Neurologic: Alert and oriented X 3, normal strength and tone. Normal symmetric reflexes. Normal coordination and gait  EKG sinus tachycardia 102 without ST or T wave changes.  I personally reviewed this EKG.  ASSESSMENT AND PLAN:   Essential hypertension History of essential hypertension with blood pressure measured at 138/92 he is on.  He has limit salt from his diet since we last met.  Continue current medications  Hyperlipidemia History of hyperlipidemia as well as hypertriglyceridemia probably related to dietary indiscretion and alcohol abuse. we will check a lipid liver profile today.  Tobacco abuse History of ongoing tobacco abuse of 2 packs/day recalcitrant to risk factor modification.      Lorretta Harp MD FACP,FACC,FAHA, [facility] 03/24/2018 9:33 AM

## 2018-03-24 NOTE — Patient Instructions (Signed)
Medication Instructions:   NO CHANGE  Labwork:  Your physician recommends that you HAVE LAB WORK TODAY  Follow-Up:  Your physician wants you to follow-up in: [place] will receive a reminder letter in the mail two months in advance. If you don't receive a letter, please call our office to schedule the follow-up appointment.   If you need a refill on your cardiac medications before your next appointment, please call your pharmacy.

## 2018-03-28 ENCOUNTER — Telehealth: Payer: Self-pay | Admitting: *Deleted

## 2018-03-28 NOTE — Telephone Encounter (Addendum)
-----   Message from Lorretta Harp, MD sent at 03/24/2018  4:50 PM EDT ----- Refer to Dr. Debara Pickett for lipid evaluation  Left message for pt to call

## 2018-04-05 ENCOUNTER — Other Ambulatory Visit: Payer: Self-pay | Admitting: *Deleted

## 2018-04-05 DIAGNOSIS — E785 Hyperlipidemia, unspecified: Secondary | ICD-10-CM

## 2018-04-07 ENCOUNTER — Telehealth: Payer: Self-pay | Admitting: Oncology

## 2018-04-07 ENCOUNTER — Encounter: Payer: Self-pay | Admitting: *Deleted

## 2018-04-07 ENCOUNTER — Encounter: Payer: Self-pay | Admitting: Oncology

## 2018-04-07 NOTE — Telephone Encounter (Signed)
New referral received from [facility], NP for increased hemoglobin. Pt has been scheduled to see Dr. Alen Blew on 8/1 at 2pm. Letter mailed to the pt.

## 2018-04-08 ENCOUNTER — Other Ambulatory Visit: Payer: Self-pay | Admitting: Cardiovascular Disease

## 2018-04-10 NOTE — Telephone Encounter (Signed)
Rx sent to pharmacy   

## 2018-04-10 NOTE — Telephone Encounter (Signed)
Follow up scheduled with dr Debara Pickett

## 2018-04-19 ENCOUNTER — Encounter: Payer: Self-pay | Admitting: Internal Medicine

## 2018-04-19 ENCOUNTER — Ambulatory Visit: Payer: 59 | Admitting: Internal Medicine

## 2018-04-19 VITALS — BP 162/88 | HR 106 | Ht 71.0 in | Wt 179.0 lb

## 2018-04-19 DIAGNOSIS — Z720 Tobacco use: Secondary | ICD-10-CM

## 2018-04-19 DIAGNOSIS — I10 Essential (primary) hypertension: Secondary | ICD-10-CM | POA: Diagnosis not present

## 2018-04-19 DIAGNOSIS — F1010 Alcohol abuse, uncomplicated: Secondary | ICD-10-CM

## 2018-04-19 DIAGNOSIS — J449 Chronic obstructive pulmonary disease, unspecified: Secondary | ICD-10-CM

## 2018-04-19 DIAGNOSIS — E781 Pure hyperglyceridemia: Secondary | ICD-10-CM

## 2018-04-19 DIAGNOSIS — R0902 Hypoxemia: Secondary | ICD-10-CM

## 2018-04-19 NOTE — Patient Instructions (Signed)
Dr. Debara Pickett recommends decreasing alcohol intake. This will help with your elevated triglycerides.   Dr. Debara Pickett will send a note to your primary care provider concerning your oxygen level. You may need to see a lung specialist (pulmonologist)   Your physician recommends that you schedule a follow-up appointment as needed with Dr. Debara Pickett for lipid management

## 2018-04-19 NOTE — Progress Notes (Signed)
OFFICE CONSULT NOTE  Chief Complaint:  Elevated triglycerides  Primary Care Physician: James Bacon, NP  HPI:  James Stokes is a 62 y.o. male who is being seen today for the evaluation of hypertriglyceridemia at the request of James Harp, MD. This is a pleasant 62 year old male patient of James Stokes is followed for hypertension and dyslipidemia.  Fortunately has a long-standing history of heavy alcohol and tobacco abuse.  He recently cut down his smoking to 2 packs a day.  He generally drinks on average 5-6 beers per night.  He works in Environmental health practitioner.  Recently established with a primary care provider.  Blood work showed a significant erythrocytosis.  He was referred to hematology for evaluation and upcoming appointments on August 1.  He is referred here for triglycerides which are elevated in the 400s.  He is generally been below 500 in the past but is noted to have elevated triglycerides.  He reports his father and elevated triglycerides as well and may suggest lipoprotein lipase deficiency or familial hypertriglyceridemia.  Fortunately, he has no history of cardiovascular events we know of.  Recently however he has been reporting fatigue weakness.  Blood pressure is noted to be elevated today 162/88.  He has not been on treatment for his triglycerides in the past.  He is not on a statin.  Of note his heart rate was over 100 today.  Oxygen saturation was only 79%.  PMHx:  Past Medical History:  Diagnosis Date  . Dyslipidemia   . ETOH abuse   . HTN (hypertension)   . Hypertriglyceridemia   . Kidney stones   . Smoker     Past Surgical History:  Procedure Laterality Date  . HERNIA REPAIR     pt was 87 month old  . HERNIA REPAIR     double hernia pt unsure of exact date, James Stokes did surgery  . lipoma  05/1989   fatty tumor on back    FAMHx:  Family History  Problem Relation Age of Onset  . COPD Mother   . Vascular Disease Father 34       aneurysm  .  Hyperlipidemia Father   . Hypertension Father   . Cancer Father   . Diabetes Paternal Aunt   . Diabetes Paternal Uncle     SOCHx:   reports that he has been smoking cigarettes.  He has a 64.50 pack-year smoking history. He has quit using smokeless tobacco. His smokeless tobacco use included chew. He reports that he drinks about 9.0 oz of alcohol per week. He reports that he has current or past drug history. Drug: Marijuana.  ALLERGIES:  Allergies  Allergen Reactions  . Other Itching    Seafood - causes itching  . Shrimp [Shellfish Allergy] Itching    ROS: Pertinent items noted in HPI and remainder of comprehensive ROS otherwise negative.  HOME MEDS: Current Outpatient Medications on File Prior to Visit  Medication Sig Dispense Refill  . albuterol (PROVENTIL HFA;VENTOLIN HFA) 108 (90 Base) MCG/ACT inhaler Inhale 2 puffs into the lungs every 6 (six) hours as needed for wheezing or shortness of breath (cough, shortness of breath or wheezing.). 1 Inhaler 1  . amLODipine (NORVASC) 10 MG tablet TAKE 1 TABLET BY MOUTH EVERY DAY 90 tablet 3  . aspirin 81 MG tablet Take 81 mg by mouth daily.    Marland Kitchen lisinopril (PRINIVIL,ZESTRIL) 20 MG tablet Take 1 tablet (20 mg total) by mouth daily. NEED OV. 90 tablet 0  .  Multiple Vitamins-Minerals (MULTIVITAMIN ADULT PO) Take 1 tablet by mouth daily.     No current facility-administered medications on file prior to visit.     LABS/IMAGING: No results Stokes for this or any previous visit (from the past 48 hour(s)). No results Stokes.  LIPID PANEL:    Component Value Date/Time   CHOL 235 (H) 03/24/2018 0939   TRIG 423 (H) 03/24/2018 0939   HDL 45 03/24/2018 0939   CHOLHDL 5.2 (H) 03/24/2018 0939   CHOLHDL 6.8 (H) 01/25/2017 0901   VLDL NOT CALC 01/25/2017 0901   LDLCALC Comment 03/24/2018 0939   LDLDIRECT 75 01/25/2017 0901    WEIGHTS: Wt Readings from Last 3 Encounters:  04/19/18 179 lb (81.2 kg)  03/24/18 179 lb (81.2 kg)  01/25/17 181 lb  9.6 oz (82.4 kg)    VITALS: BP (!) 162/88   Pulse (!) 106   Ht 5\' 11"  (1.803 m)   Wt 179 lb (81.2 kg)   SpO2 (!) 79%   BMI 24.97 kg/m   EXAM: General appearance: alert and no distress Neck: no carotid bruit, no JVD and thyroid not enlarged, symmetric, no tenderness/mass/nodules Lungs: diminished breath sounds bilaterally and wheezes bilaterally and end-expiratory Heart: regular rate and rhythm Abdomen: soft, non-tender; bowel sounds normal; no masses,  no organomegaly Extremities: notable digital clubbing Pulses: 2+ and symmetric Skin: neck and facial erythema Neurologic: Grossly normal Psych: Pleasant  EKG: Deferred  ASSESSMENT: 1. Primary hypertriglyceridemia 2. COPD with chronic hypoxia 3. Erythrocytosis likely today secondary to #2 4. Uncontrolled hypertension 5. Tobacco abuse 6. Alcohol abuse  PLAN: 1.   [name] has primary hypertriglyceridemia, but is not in the danger range of pancreatitis.  He has no known history of cardiovascular events.  He is not currently on statin therapy although direct LDL from last year was in the 70s.  HDL cholesterol now measures about 160.  Primarily he should reduce his alcohol use to lower triglycerides.  He does not seem very willing to do this however if he continue to cut his use back in a half cubic a significant difference.  He also needs to stop smoking.  He is noted to be hypoxic today and a significant digital clubbing.  I suspect his erythrocytosis is likely secondary to chronic hypoxia advanced COPD.  Be interesting to see what Dr. Alen Stokes says, but I would suggest he may need to go on oxygen.  Pulmonary referral released probably function testing is indicated.  We could also consider a fibrate however there is little data for significant cardiovascular benefit with triglycerides less than 500.  Lifestyle modification will be overall more beneficial for him.  Follow-up as needed. Thanks as always for the kind  referral.  James Casino, MD, FACC, [place] Director of the [facility] Diplomate of the American Board of Clinical Lipidology Attending Cardiologist  Direct Dial: [phone]  Fax: [phone]  Website:  [email or web address] 04/19/2018, 10:11 AM

## 2018-04-27 ENCOUNTER — Inpatient Hospital Stay: Payer: 59 | Attending: Oncology | Admitting: Oncology

## 2018-04-27 VITALS — BP 155/88 | HR 115 | Temp 98.9°F | Resp 18 | Ht 71.0 in | Wt 181.1 lb

## 2018-04-27 DIAGNOSIS — F17210 Nicotine dependence, cigarettes, uncomplicated: Secondary | ICD-10-CM | POA: Insufficient documentation

## 2018-04-27 DIAGNOSIS — D751 Secondary polycythemia: Secondary | ICD-10-CM | POA: Diagnosis present

## 2018-04-27 NOTE — Progress Notes (Signed)
Reason for the request: Polycythemia  HPI: I was asked by Levin Bacon, FNP of the patient's primary care provider  to evaluate James Stokes for polycythemia.  He is a 62 year old man with history of hypertension, hyperlipidemia as well as polysubstance abuse including heavy tobacco and alcohol use.  He currently smokes 2 packs a day.  He was noted to have elevated hemoglobin dating back to 2014 and at that time his hemoglobin was 17.8, in 2017 was 18.2, and 2018 was 18.7 in October 2018 was up to 19.6.  In June 2019 his hemoglobin was 21.7, white cell count was 9.8 with normal platelet count of 196.  His differential has been always normal.  His MCV was normal as well.  He is completely asymptomatic from these findings and of denies any headaches, dyspnea on exertion or excessive fatigue or tiredness.  His appetite is excellent and have gained weight.  He is trying to cut down on his smoking currently.  He does not report any headaches, blurry vision, syncope or seizures. Does not report any fevers, chills or sweats.  Does not report any cough, wheezing or hemoptysis.  Does not report any chest pain, palpitation, orthopnea or leg edema.  Does not report any nausea, vomiting or abdominal pain.  Does not report any constipation or diarrhea.  Does not report any skeletal complaints.    Does not report frequency, urgency or hematuria.  Does not report any skin rashes or lesions. Does not report any heat or cold intolerance.  Does not report any lymphadenopathy or petechiae.  Does not report any anxiety or depression.  Remaining review of systems is negative.    Past Medical History:  Diagnosis Date  . Dyslipidemia   . ETOH abuse   . HTN (hypertension)   . Hypertriglyceridemia   . Kidney stones   . Smoker   :  Past Surgical History:  Procedure Laterality Date  . HERNIA REPAIR     pt was 34 month old  . HERNIA REPAIR     double hernia pt unsure of exact date, DR. Ballen did surgery  . lipoma  05/1989    fatty tumor on back  :   Current Outpatient Medications:  .  albuterol (PROVENTIL HFA;VENTOLIN HFA) 108 (90 Base) MCG/ACT inhaler, Inhale 2 puffs into the lungs every 6 (six) hours as needed for wheezing or shortness of breath (cough, shortness of breath or wheezing.)., Disp: 1 Inhaler, Rfl: 1 .  amLODipine (NORVASC) 10 MG tablet, TAKE 1 TABLET BY MOUTH EVERY DAY, Disp: 90 tablet, Rfl: 3 .  aspirin 81 MG tablet, Take 81 mg by mouth daily., Disp: , Rfl:  .  lisinopril (PRINIVIL,ZESTRIL) 20 MG tablet, Take 1 tablet (20 mg total) by mouth daily. NEED OV., Disp: 90 tablet, Rfl: 0 .  Multiple Vitamins-Minerals (MULTIVITAMIN ADULT PO), Take 1 tablet by mouth daily., Disp: , Rfl: :  Allergies  Allergen Reactions  . Other Itching    Seafood - causes itching  . Shrimp [Shellfish Allergy] Itching  :  Family History  Problem Relation Age of Onset  . COPD Mother   . Vascular Disease Father 74       aneurysm  . Hyperlipidemia Father   . Hypertension Father   . Cancer Father   . Diabetes Paternal Aunt   . Diabetes Paternal Uncle   :  Social History   Socioeconomic History  . Marital status: Married    Spouse name: Not on file  . Number of children:  Not on file  . Years of education: Not on file  . Highest education level: Not on file  Occupational History  . Not on file  Social Needs  . Financial resource strain: Not on file  . Food insecurity:    Worry: Not on file    Inability: Not on file  . Transportation needs:    Medical: Not on file    Non-medical: Not on file  Tobacco Use  . Smoking status: Current Every Day Smoker    Packs/day: 1.50    Years: 43.00    Pack years: 64.50    Types: Cigarettes  . Smokeless tobacco: Former Systems developer    Types: Chew  Substance and Sexual Activity  . Alcohol use: Yes    Alcohol/week: 9.0 oz    Types: 15 Cans of beer per week    Comment: "6-10 beers a night"  . Drug use: Yes    Types: Marijuana  . Sexual activity: Not on file   Lifestyle  . Physical activity:    Days per week: Not on file    Minutes per session: Not on file  . Stress: Not on file  Relationships  . Social connections:    Talks on phone: Not on file    Gets together: Not on file    Attends religious service: Not on file    Active member of club or organization: Not on file    Attends meetings of clubs or organizations: Not on file    Relationship status: Not on file  . Intimate partner violence:    Fear of current or ex partner: Not on file    Emotionally abused: Not on file    Physically abused: Not on file    Forced sexual activity: Not on file  Other Topics Concern  . Not on file  Social History Narrative  . Not on file  :  Pertinent items are noted in HPI.  Exam: Blood pressure (!) 155/88, pulse (!) 115, temperature 98.9 F (37.2 C), temperature source Oral, resp. rate 18, height 5\' 11"  (1.803 m), weight 181 lb 1.6 oz (82.1 kg), SpO2 (!) 80 %.   General appearance: alert and cooperative appeared without distress. Head: atraumatic without any abnormalities. Eyes: conjunctivae/corneas clear. PERRL.  Sclera anicteric. Throat: lips, mucosa, and tongue normal; without oral thrush or ulcers. Resp: clear to auscultation bilaterally without rhonchi, wheezes or dullness to percussion. Cardio: regular rate and rhythm, S1, S2 normal, no murmur, click, rub or gallop GI: soft, non-tender; bowel sounds normal; no masses,  no organomegaly Skin: Skin color, texture, turgor normal. No rashes or lesions Lymph nodes: Cervical, supraclavicular, and axillary nodes normal. Neurologic: Grossly normal without any motor, sensory or deep tendon reflexes. Musculoskeletal: No joint deformity or effusion.  CBC    Component Value Date/Time   WBC 7.4 07/08/2017 0941   RBC 5.70 07/08/2017 0941   HGB 19.6 (H) 07/08/2017 0941   HCT 55.1 (H) 07/08/2017 0941   PLT 186 07/08/2017 0941   MCV 96.7 07/08/2017 0941   MCV 93.2 12/31/2015 0914   MCH 34.4 (H)  07/08/2017 0941   MCHC 35.6 07/08/2017 0941   RDW 12.8 07/08/2017 0941   LYMPHSABS 2.1 12/02/2015 1008   MONOABS 0.6 12/02/2015 1008   EOSABS 0.2 12/02/2015 1008   BASOSABS 0.1 12/02/2015 1008     Chemistry      Component Value Date/Time   NA 139 07/08/2017 0941   K 3.9 07/08/2017 0941   CL 100 (L) 07/08/2017  0941   CO2 29 07/08/2017 0941   BUN 14 07/08/2017 0941   CREATININE 1.14 07/08/2017 0941   CREATININE 1.19 01/25/2017 0901      Component Value Date/Time   CALCIUM 9.5 07/08/2017 0941   ALKPHOS 90 03/24/2018 0939   AST 17 03/24/2018 0939   ALT 32 03/24/2018 0939   BILITOT 0.5 03/24/2018 0939        Assessment and Plan:   62 year old man with the following:  1.  Polycythemia: This was detected with an elevated hemoglobin dating back to 2014.  His hemoglobin was around 17 and 19 and most recently was up to 21 with normal white cell count and platelet count.  He is completely asymptomatic from these findings without any constitutional symptoms.  Differential diagnosis and management options were reviewed today with the patient.  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  Polycythemia vera is considered less likely.  However management standpoint, I have recommended decreasing his tobacco use and improved hydration as well as minimizing exposure to toxic fumes related to his occupation.  The role of phlebotomy was discussed today including risks and benefits associated with this procedure.  The benefits associated with secondary polycythemia is very limited and he would like to defer this option.  For the meantime, he will proceed with tobacco use reduction as well as conservative management to reduce his exposure to toxic fumes and increase hydration and anticipate improvement in his hemoglobin over period of time.  2.  Thrombosis prophylaxis: Low-dose aspirin would be useful in preventing any arterial  events.  He is already doing that.  His risk of thrombosis is low at this time.  3.  Follow-up: I am happy to see him in the future as needed.  30  minutes was spent with the patient face-to-face today.  More than 50% of time was dedicated to discussing the differential diagnosis, management options as well as coordinating his care.    Thank you for the referral.   A copy of this consult has been forwarded to the requesting physician.

## 2018-04-28 ENCOUNTER — Telehealth: Payer: Self-pay

## 2018-04-28 ENCOUNTER — Other Ambulatory Visit: Payer: Self-pay | Admitting: Cardiovascular Disease

## 2018-04-28 NOTE — Telephone Encounter (Signed)
Per 8/1 no los

## 2018-08-15 ENCOUNTER — Telehealth: Payer: Self-pay | Admitting: Cardiovascular Disease

## 2018-08-15 NOTE — Telephone Encounter (Signed)
Returned call to patient's wife. She stated husband needs appointment.Stated he is having swelling in both lower legs and feet.Stated lower legs discolored, purple.Stated he wants to sleep all the time.He is sob,wheezes.Stated he has been feeling bad for the past 6 months.Appointment scheduled with Kerin Ransom PA 08/17/18 at 8:30 am.Advised to bring all medications to appointment.

## 2018-08-15 NOTE — Telephone Encounter (Signed)
Pt c/o swelling: STAT is pt has developed SOB within 24 hours  1) How much weight have you gained and in what time span? Has not weighed himself   2) If swelling, where is the swelling located? Legs & feet and discoloration of the skin   3) Are you currently taking a fluid pill?   Patient is on blood pressure  meds:  lisinopril (PRINIVIL,ZESTRIL) 20  MG tablet  amLODipine (NORVASC) 10 MG  tablet  4) Are you currently SOB? no  5) Do you have a log of your daily weights (if so, list)? no  6) Have you gained 3 pounds in a day or 5 pounds in a week? unsure  7) Have you traveled recently? No  Patient's spouse is calling she is concerned with the swelling in his legs and feet. She said he is having some labored breathing but she is not sure if that is due to him being a smoker.

## 2018-08-17 ENCOUNTER — Encounter: Payer: Self-pay | Admitting: Cardiology

## 2018-08-17 ENCOUNTER — Inpatient Hospital Stay: Admit: 2018-08-17 | Payer: Self-pay | Admitting: Cardiovascular Disease

## 2018-08-17 ENCOUNTER — Encounter (HOSPITAL_COMMUNITY): Payer: Self-pay | Admitting: Emergency Medicine

## 2018-08-17 ENCOUNTER — Inpatient Hospital Stay (HOSPITAL_COMMUNITY): Payer: 59

## 2018-08-17 ENCOUNTER — Inpatient Hospital Stay (HOSPITAL_COMMUNITY)
Admission: EM | Admit: 2018-08-17 | Discharge: 2018-08-22 | DRG: 189 | Disposition: A | Discharge status: Home / Self Care | Payer: 59 | Attending: Cardiovascular Disease | Admitting: Cardiovascular Disease

## 2018-08-17 ENCOUNTER — Ambulatory Visit: Payer: 59 | Admitting: Cardiology

## 2018-08-17 VITALS — BP 140/88 | HR 111 | Ht 71.0 in | Wt 189.8 lb

## 2018-08-17 DIAGNOSIS — F1010 Alcohol abuse, uncomplicated: Secondary | ICD-10-CM | POA: Diagnosis present

## 2018-08-17 DIAGNOSIS — R0902 Hypoxemia: Secondary | ICD-10-CM | POA: Diagnosis not present

## 2018-08-17 DIAGNOSIS — I10 Essential (primary) hypertension: Secondary | ICD-10-CM | POA: Diagnosis present

## 2018-08-17 DIAGNOSIS — D751 Secondary polycythemia: Secondary | ICD-10-CM | POA: Diagnosis present

## 2018-08-17 DIAGNOSIS — E781 Pure hyperglyceridemia: Secondary | ICD-10-CM | POA: Diagnosis present

## 2018-08-17 DIAGNOSIS — I5031 Acute diastolic (congestive) heart failure: Secondary | ICD-10-CM | POA: Diagnosis present

## 2018-08-17 DIAGNOSIS — Z720 Tobacco use: Secondary | ICD-10-CM

## 2018-08-17 DIAGNOSIS — J432 Centrilobular emphysema: Secondary | ICD-10-CM | POA: Diagnosis present

## 2018-08-17 DIAGNOSIS — I2781 Cor pulmonale (chronic): Secondary | ICD-10-CM | POA: Diagnosis present

## 2018-08-17 DIAGNOSIS — I110 Hypertensive heart disease with heart failure: Secondary | ICD-10-CM | POA: Diagnosis present

## 2018-08-17 DIAGNOSIS — R683 Clubbing of fingers: Secondary | ICD-10-CM | POA: Diagnosis not present

## 2018-08-17 DIAGNOSIS — F17200 Nicotine dependence, unspecified, uncomplicated: Secondary | ICD-10-CM

## 2018-08-17 DIAGNOSIS — G4733 Obstructive sleep apnea (adult) (pediatric): Secondary | ICD-10-CM | POA: Diagnosis present

## 2018-08-17 DIAGNOSIS — R0602 Shortness of breath: Secondary | ICD-10-CM

## 2018-08-17 DIAGNOSIS — J9601 Acute respiratory failure with hypoxia: Secondary | ICD-10-CM

## 2018-08-17 DIAGNOSIS — Z79899 Other long term (current) drug therapy: Secondary | ICD-10-CM

## 2018-08-17 DIAGNOSIS — F17210 Nicotine dependence, cigarettes, uncomplicated: Secondary | ICD-10-CM | POA: Diagnosis present

## 2018-08-17 DIAGNOSIS — J449 Chronic obstructive pulmonary disease, unspecified: Secondary | ICD-10-CM

## 2018-08-17 DIAGNOSIS — I50813 Acute on chronic right heart failure: Secondary | ICD-10-CM | POA: Diagnosis not present

## 2018-08-17 DIAGNOSIS — I2510 Atherosclerotic heart disease of native coronary artery without angina pectoris: Secondary | ICD-10-CM | POA: Diagnosis present

## 2018-08-17 DIAGNOSIS — Z7982 Long term (current) use of aspirin: Secondary | ICD-10-CM

## 2018-08-17 DIAGNOSIS — I5082 Biventricular heart failure: Secondary | ICD-10-CM | POA: Diagnosis present

## 2018-08-17 DIAGNOSIS — J9692 Respiratory failure, unspecified with hypercapnia: Secondary | ICD-10-CM | POA: Diagnosis present

## 2018-08-17 DIAGNOSIS — J9621 Acute and chronic respiratory failure with hypoxia: Secondary | ICD-10-CM | POA: Diagnosis present

## 2018-08-17 DIAGNOSIS — I50812 Chronic right heart failure: Secondary | ICD-10-CM | POA: Diagnosis not present

## 2018-08-17 DIAGNOSIS — J441 Chronic obstructive pulmonary disease with (acute) exacerbation: Secondary | ICD-10-CM | POA: Diagnosis not present

## 2018-08-17 DIAGNOSIS — J9622 Acute and chronic respiratory failure with hypercapnia: Secondary | ICD-10-CM | POA: Diagnosis present

## 2018-08-17 DIAGNOSIS — I7090 Unspecified atherosclerosis: Secondary | ICD-10-CM | POA: Diagnosis present

## 2018-08-17 HISTORY — DX: Unspecified atherosclerosis: I70.90

## 2018-08-17 HISTORY — DX: Acute diastolic (congestive) heart failure: I50.31

## 2018-08-17 LAB — I-STAT ARTERIAL BLOOD GAS, ED
Acid-Base Excess: 3 mmol/L — ABNORMAL HIGH (ref 0.0–2.0)
Bicarbonate: 32.1 mmol/L — ABNORMAL HIGH (ref 20.0–28.0)
O2 SAT: 79 %
PCO2 ART: 61.1 mmHg — AB (ref 32.0–48.0)
PH ART: 7.328 — AB (ref 7.350–7.450)
PO2 ART: 48 mmHg — AB (ref 83.0–108.0)
TCO2: 34 mmol/L — ABNORMAL HIGH (ref 22–32)

## 2018-08-17 LAB — BASIC METABOLIC PANEL
Anion gap: 11 (ref 5–15)
BUN: 17 mg/dL (ref 6–20)
CO2: 28 mmol/L (ref 22–32)
Calcium: 8.5 mg/dL — ABNORMAL LOW (ref 8.9–10.3)
Chloride: 100 mmol/L (ref 98–111)
Creatinine, Ser: 1.53 mg/dL — ABNORMAL HIGH (ref 0.61–1.24)
GFR, EST AFRICAN AMERICAN: 55 mL/min — AB (ref >60)
GFR, EST NON AFRICAN AMERICAN: 48 mL/min — AB (ref >60)
GLUCOSE: 171 mg/dL — AB (ref 70–99)
POTASSIUM: 3.8 mmol/L (ref 3.5–5.1)
SODIUM: 139 mmol/L (ref 135–145)

## 2018-08-17 LAB — URINALYSIS, ROUTINE W REFLEX MICROSCOPIC
Bilirubin Urine: NEGATIVE
Glucose, UA: NEGATIVE mg/dL
Hgb urine dipstick: NEGATIVE
Ketones, ur: NEGATIVE mg/dL
Leukocytes, UA: NEGATIVE
NITRITE: NEGATIVE
PH: 7 (ref 5.0–8.0)
Protein, ur: NEGATIVE mg/dL
SPECIFIC GRAVITY, URINE: 1.005 (ref 1.005–1.030)

## 2018-08-17 LAB — CBC WITH DIFFERENTIAL/PLATELET
ABS IMMATURE GRANULOCYTES: 0.04 10*3/uL (ref 0.00–0.07)
Basophils Absolute: 0.1 10*3/uL (ref 0.0–0.1)
Basophils Relative: 1 %
Eosinophils Absolute: 0.2 10*3/uL (ref 0.0–0.5)
Eosinophils Relative: 2 %
HEMATOCRIT: 64.3 % — AB (ref 39.0–52.0)
HEMOGLOBIN: 20 g/dL — AB (ref 13.0–17.0)
IMMATURE GRANULOCYTES: 1 %
LYMPHS ABS: 1.2 10*3/uL (ref 0.7–4.0)
LYMPHS PCT: 14 %
MCH: 30.4 pg (ref 26.0–34.0)
MCHC: 31.1 g/dL (ref 30.0–36.0)
MCV: 97.7 fL (ref 80.0–100.0)
MONO ABS: 0.8 10*3/uL (ref 0.1–1.0)
MONOS PCT: 9 %
NEUTROS ABS: 6.3 10*3/uL (ref 1.7–7.7)
NEUTROS PCT: 73 %
Platelets: 200 10*3/uL (ref 150–400)
RBC: 6.58 MIL/uL — ABNORMAL HIGH (ref 4.22–5.81)
RDW: 15.8 % — ABNORMAL HIGH (ref 11.5–15.5)
WBC: 8.5 10*3/uL (ref 4.0–10.5)
nRBC: 0 % (ref 0.0–0.2)

## 2018-08-17 LAB — PROTIME-INR
INR: 1.04
Prothrombin Time: 13.5 seconds (ref 11.4–15.2)

## 2018-08-17 LAB — I-STAT CG4 LACTIC ACID, ED
Lactic Acid, Venous: 1.26 mmol/L (ref 0.5–1.9)
Lactic Acid, Venous: 1.4 mmol/L (ref 0.5–1.9)

## 2018-08-17 LAB — BRAIN NATRIURETIC PEPTIDE: B Natriuretic Peptide: 131.5 pg/mL — ABNORMAL HIGH (ref 0.0–100.0)

## 2018-08-17 LAB — TSH: TSH: 2.502 u[IU]/mL (ref 0.350–4.500)

## 2018-08-17 LAB — I-STAT TROPONIN, ED: Troponin i, poc: 0.01 ng/mL (ref 0.00–0.08)

## 2018-08-17 LAB — ECHOCARDIOGRAM COMPLETE
Height: 71 in
Weight: 3033.53 oz

## 2018-08-17 MED ORDER — SODIUM CHLORIDE 0.9% FLUSH: 3.0000 mL | INTRAVENOUS | Status: DC | PRN

## 2018-08-17 MED ORDER — NICOTINE 21 MG/24HR TD PT24
21.0000 mg | MEDICATED_PATCH | Freq: Once | TRANSDERMAL | Status: AC
  Administered 2018-08-17: 21 mg via TRANSDERMAL
  Filled 2018-08-17: qty 1

## 2018-08-17 MED ORDER — ASPIRIN EC 81 MG PO TBEC
81.0000 mg | DELAYED_RELEASE_TABLET | Freq: Every day | ORAL | Status: DC
  Administered 2018-08-18 – 2018-08-22 (×5): 81 mg via ORAL
  Filled 2018-08-17 (×5): qty 1

## 2018-08-17 MED ORDER — ENOXAPARIN SODIUM 40 MG/0.4ML ~~LOC~~ SOLN
40.0000 mg | SUBCUTANEOUS | Status: DC
  Administered 2018-08-18 – 2018-08-21 (×5): 40 mg via SUBCUTANEOUS
  Filled 2018-08-17 (×6): qty 0.4

## 2018-08-17 MED ORDER — IPRATROPIUM-ALBUTEROL 0.5-2.5 (3) MG/3ML IN SOLN
RESPIRATORY_TRACT | Status: AC
  Filled 2018-08-17: qty 3

## 2018-08-17 MED ORDER — IPRATROPIUM-ALBUTEROL 0.5-2.5 (3) MG/3ML IN SOLN
3.0000 mL | RESPIRATORY_TRACT | Status: DC | PRN
  Administered 2018-08-17 – 2018-08-19 (×2): 3 mL via RESPIRATORY_TRACT
  Filled 2018-08-17: qty 3

## 2018-08-17 MED ORDER — LOSARTAN POTASSIUM 25 MG PO TABS
25.0000 mg | ORAL_TABLET | Freq: Every day | ORAL | Status: DC
  Administered 2018-08-18 – 2018-08-22 (×5): 25 mg via ORAL
  Filled 2018-08-17 (×5): qty 1

## 2018-08-17 MED ORDER — PANTOPRAZOLE SODIUM 40 MG PO TBEC
40.0000 mg | DELAYED_RELEASE_TABLET | Freq: Every day | ORAL | Status: DC
  Administered 2018-08-17 – 2018-08-22 (×6): 40 mg via ORAL
  Filled 2018-08-17 (×6): qty 1

## 2018-08-17 MED ORDER — ALPRAZOLAM 0.25 MG PO TABS: 0.2500 mg | ORAL_TABLET | Freq: Two times a day (BID) | ORAL | Status: DC | PRN

## 2018-08-17 MED ORDER — ACETAMINOPHEN 325 MG PO TABS: 650.0000 mg | ORAL_TABLET | ORAL | Status: DC | PRN

## 2018-08-17 MED ORDER — ONDANSETRON HCL 4 MG/2ML IJ SOLN: 4.0000 mg | Freq: Four times a day (QID) | INTRAMUSCULAR | Status: DC | PRN

## 2018-08-17 MED ORDER — IPRATROPIUM-ALBUTEROL 0.5-2.5 (3) MG/3ML IN SOLN
3.0000 mL | Freq: Once | RESPIRATORY_TRACT | Status: AC
  Administered 2018-08-17: 3 mL via RESPIRATORY_TRACT
  Filled 2018-08-17: qty 3

## 2018-08-17 MED ORDER — NITROGLYCERIN 0.4 MG SL SUBL: 0.4000 mg | SUBLINGUAL_TABLET | SUBLINGUAL | Status: DC | PRN

## 2018-08-17 MED ORDER — DILTIAZEM HCL 60 MG PO TABS
60.0000 mg | ORAL_TABLET | Freq: Three times a day (TID) | ORAL | Status: DC
  Administered 2018-08-17 – 2018-08-18 (×2): 60 mg via ORAL
  Filled 2018-08-17 (×2): qty 1

## 2018-08-17 MED ORDER — METHYLPREDNISOLONE SODIUM SUCC 125 MG IJ SOLR
125.0000 mg | Freq: Once | INTRAMUSCULAR | Status: AC
  Administered 2018-08-17: 125 mg via INTRAVENOUS
  Filled 2018-08-17: qty 2

## 2018-08-17 MED ORDER — FUROSEMIDE 10 MG/ML IJ SOLN
40.0000 mg | Freq: Once | INTRAMUSCULAR | Status: AC
  Administered 2018-08-17: 40 mg via INTRAVENOUS
  Filled 2018-08-17: qty 4

## 2018-08-17 NOTE — Assessment & Plan Note (Addendum)
COPD by history and exam with clubbing, elevated Hgb (19), and diffuse wheezing in a long time 2 pack a day smoker.

## 2018-08-17 NOTE — Assessment & Plan Note (Signed)
James Stokes has significant underlying COPD and now has respiratory failure with evidence of hypercapnia and hypoxia.  I suspect he may have a component of right heart failure as well.  By his history he may also have sleep apnea.

## 2018-08-17 NOTE — Progress Notes (Signed)
08/17/2018 LINH JOHANNES [place]   10-23-1955  [id number]  Primary Physician Levin Bacon, NP Primary Cardiologist: Dr Gwenlyn Found  HPI: Mr. Kisiel is a pleasant 62 year old male followed by Dr. Alvester Chou.  He has a history of 2 pack a day smoking and daily alcohol use, 3-5 beers a day.  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  The summer Dr. Alvester Chou referred the patient to Dr. Alen Blew for hematology evaluation.  Patient's hemoglobin has been high.  Dr. Alen Blew feels this is most likely secondary to COPD.  He is also seen Dr. Debara Pickett for hypertriglyceridemia.  Dr. Debara Pickett did not feel the patient was at risk for pancreatitis.  The patient is in the office today with multiple complaints.  His wife says for the past month he has had increasing shortness of breath, increasing coughing, and increasing fatigue.  She says he falls asleep frequently during the day.  This is new for him.  He has lower extremity edema.  He also has symptoms of reflux.  In the office his O2 sat on room air is 70.  The patient is never had an echo or pulmonary evaluation.   Current Outpatient Medications  Medication Sig Dispense Refill  . albuterol (PROVENTIL HFA;VENTOLIN HFA) 108 (90 Base) MCG/ACT inhaler Inhale 2 puffs into the lungs every 6 (six) hours as needed for wheezing or shortness of breath (cough, shortness of breath or wheezing.). 1 Inhaler 1  . amLODipine (NORVASC) 10 MG tablet TAKE 1 TABLET BY MOUTH EVERY DAY 90 tablet 3  . aspirin 81 MG tablet Take 81 mg by mouth daily.    Marland Kitchen lisinopril (PRINIVIL,ZESTRIL) 20 MG tablet TAKE 1 TABLET BY MOUTH DAILY. NEED OV. 90 tablet 2  . Multiple Vitamins-Minerals (MULTIVITAMIN ADULT PO) Take 1 tablet by mouth daily.     No current facility-administered medications for this visit.     Allergies  Allergen Reactions  . Other Hives and Itching  . Shrimp [Shellfish Allergy] Itching and Rash    Past Medical History:  Diagnosis Date  . Dyslipidemia   . ETOH abuse   . HTN  (hypertension)   . Hypertriglyceridemia   . Kidney stones   . Smoker     Social History   Socioeconomic History  . Marital status: Married    Spouse name: Not on file  . Number of children: Not on file  . Years of education: Not on file  . Highest education level: Not on file  Occupational History  . Not on file  Social Needs  . Financial resource strain: Not on file  . Food insecurity:    Worry: Not on file    Inability: Not on file  . Transportation needs:    Medical: Not on file    Non-medical: Not on file  Tobacco Use  . Smoking status: Current Every Day Smoker    Packs/day: 1.50    Years: 43.00    Pack years: 64.50    Types: Cigarettes  . Smokeless tobacco: Former Systems developer    Types: Chew  Substance and Sexual Activity  . Alcohol use: Yes    Alcohol/week: 15.0 standard drinks    Types: 15 Cans of beer per week    Comment: "6-10 beers a night"  . Drug use: Yes    Types: Marijuana  . Sexual activity: Not on file  Lifestyle  . Physical activity:    Days per week: Not on file    Minutes per session: Not on file  .  Stress: Not on file  Relationships  . Social connections:    Talks on phone: Not on file    Gets together: Not on file    Attends religious service: Not on file    Active member of club or organization: Not on file    Attends meetings of clubs or organizations: Not on file    Relationship status: Not on file  . Intimate partner violence:    Fear of current or ex partner: Not on file    Emotionally abused: Not on file    Physically abused: Not on file    Forced sexual activity: Not on file  Other Topics Concern  . Not on file  Social History Narrative  . Not on file     Family History  Problem Relation Age of Onset  . COPD Mother   . Vascular Disease Father 57       aneurysm  . Hyperlipidemia Father   . Hypertension Father   . Cancer Father   . Diabetes Paternal Aunt   . Diabetes Paternal Uncle      Review of Systems: General:  negative for chills, fever, night sweats or weight changes.  Cardiovascular: negative for chest pain, palpitations, paroxysmal nocturnal dyspnea or shortness of breath Dermatological: negative for rash Respiratory: see HPI Urologic: negative for hematuria Abdominal: negative for nausea, vomiting, diarrhea, bright red blood per rectum, melena, or hematemesis Neurologic: negative for visual changes, syncope, or dizziness All other systems reviewed and are otherwise negative except as noted above.    Blood pressure 140/88, pulse (!) 111, height 5\' 11"  (1.803 m), weight 189 lb 12.8 oz (86.1 kg).  General appearance: alert, cooperative and mild distress Neck: no JVD Lungs: diffuse wheezing Heart: regular rate and rhythm Abdomen: not distended, liver down 3 fingers below costal margin, ventral hernia present Extremities: clubbing noted, his lower extremities have a ruddy color.  He has 1+ bilateral lower extremity edema. Pulses: 1+ DP Skin: Skin color, texture, turgor normal. No rashes or lesions or ruddy complexion Neurologic: Grossly normal  EKG normal sinus rhythm sinus tachycardia right axis deviation right atrial enlargement with pulmonary disease pattern  ASSESSMENT AND PLAN:   Acute on chronic respiratory failure with hypoxia [facility]) Mr. Samek has significant underlying COPD and now has respiratory failure with evidence of hypercapnia and hypoxia.  I suspect he may have a component of right heart failure as well.  By his history he may also have sleep apnea.  COPD with hypoxia ([place]) COPD by history and exam with clubbing, elevated Hgb (19), and diffuse wheezing in a long time 2 pack a day smoker.  Current smoker 2 PPD  ETOH abuse 3-5 beers daily  Essential hypertension Will stop lisinopril and consider losartan.  With his lower extremity edema I think I would also stop his amlodipine.   PLAN Mr. Rossman O2 sat came up to 80 on 1 L of O2.  I reviewed this with Dr. Claiborne Billings  and we feel he should be admitted to the hospital for further evaluation and treatment.  We will start a diuretic.  We will stop his ACE inhibitor and his amlodipine, we could consider losartan depending on his labs and blood pressure if needed.  He needs a pulmonary consult and an echo.  Eventually he will need a sleep study as an outpatient.  We told him smoking cessation and avoidance of alcohol and caffeine will be critical to his recovery long-term health.  Kerin Ransom PA-C 08/17/2018 9:21 AM

## 2018-08-17 NOTE — ED Provider Notes (Signed)
[facility] EMERGENCY DEPARTMENT Provider Note   CSN: [id number] Arrival date & time: 08/17/18  1000     History   Chief Complaint Chief Complaint  Patient presents with  . Shortness of Breath    HPI James Stokes is a 62 y.o. male.  HPI Patient arrives via EMS from cardiology office.  He had oxygen saturations at 72 on room air in the office.  He was sent via EMS for stabilization and for admission.  Patient reports that the symptoms have been developing over 6 to 8 weeks.  He reports he has been chronically short of breath.  He reports that got somewhat worse over the past few weeks but has been pretty consistent and frequent.  He reports he has had a cough productive of brownish colored sputum intermittently.  Sometimes cough is dry and hacking.  He denies he had fever or chest pain.  He reports he continues to work and do usual activities.  He reports his feet and lower legs have been swollen for months.  He is a 2 pack/day smoker over long period of time.  Patient denies home oxygen use.  He does not know what his oxygen level runs at home. Past Medical History:  Diagnosis Date  . Dyslipidemia   . ETOH abuse   . HTN (hypertension)   . Hypertriglyceridemia   . Kidney stones   . Smoker     Patient Active Problem List   Diagnosis Date Noted  . Acute on chronic respiratory failure with hypoxia ([place]) 08/17/2018  . COPD with hypoxia ([place]) 04/19/2018  . ETOH abuse 07/17/2014  . Current smoker 07/17/2014  . Essential hypertension 05/16/2013  . Hyperlipidemia 05/16/2013  . Tobacco abuse 05/16/2013    Past Surgical History:  Procedure Laterality Date  . HERNIA REPAIR     pt was 35 month old  . HERNIA REPAIR     double hernia pt unsure of exact date, DR. Ballen did surgery  . lipoma  05/1989   fatty tumor on back        Home Medications    Prior to Admission medications   Medication Sig Start Date End Date Taking? Authorizing Provider    albuterol (PROVENTIL HFA;VENTOLIN HFA) 108 (90 Base) MCG/ACT inhaler Inhale 2 puffs into the lungs every 6 (six) hours as needed for wheezing or shortness of breath (cough, shortness of breath or wheezing.). 12/31/15   Jaynee Eagles, PA-C  amLODipine (NORVASC) 10 MG tablet TAKE 1 TABLET BY MOUTH EVERY DAY 04/10/18   Lorretta Harp, MD  aspirin 81 MG tablet Take 81 mg by mouth daily.    [provider]  lisinopril (PRINIVIL,ZESTRIL) 20 MG tablet TAKE 1 TABLET BY MOUTH DAILY. NEED OV. 04/28/18   Lorretta Harp, MD  Multiple Vitamins-Minerals (MULTIVITAMIN ADULT PO) Take 1 tablet by mouth daily.    [provider]    Family History Family History  Problem Relation Age of Onset  . COPD Mother   . Vascular Disease Father 46       aneurysm  . Hyperlipidemia Father   . Hypertension Father   . Cancer Father   . Diabetes Paternal Aunt   . Diabetes Paternal Uncle     Social History Social History   Tobacco Use  . Smoking status: Current Every Day Smoker    Packs/day: 2.00    Years: 43.00    Pack years: 86.00    Types: Cigarettes  . Smokeless tobacco: Former  User    Types: Chew  Substance Use Topics  . Alcohol use: Yes    Alcohol/week: 15.0 standard drinks    Types: 15 Cans of beer per week    Comment: "6-10 beers a night"  . Drug use: Yes    Types: Marijuana     Allergies   Other and Shrimp [shellfish allergy]   Review of Systems Review of Systems 10 Systems reviewed and are negative for acute change except as noted in the HPI.   Physical Exam Updated Vital Signs BP (!) 162/106   Pulse (!) 109   Temp 98.9 F (37.2 C) (Oral)   Resp (!) 22   Ht 5\' 11"  (1.803 m)   Wt 86 kg   SpO2 91%   BMI 26.44 kg/m   Physical Exam  Constitutional: He is oriented to person, place, and time.  Patient is alert and appropriate.  Off of oxygen, saturation is 70%.  He maintains normal mental status.  Mild increased work of breathing but talking in full sentences  without difficulty.  HENT:  Head: Normocephalic and atraumatic.  Mouth/Throat: Oropharynx is clear and moist.  Eyes: EOM are normal.  Cardiovascular:  Borderline tachycardia.  No gross rub murmur gallop.  Pulmonary/Chest:  Crackles in lower lung fields with scattered expiratory wheeze as well.  Abdominal: Soft. He exhibits no distension. There is no tenderness. There is no guarding.  Musculoskeletal:  1+ to 2+ pitting edema of the feet and lower legs.  Dorsalis pedis pulses 2+ and symmetric.  Calves soft and nontender.  Neurological: He is alert and oriented to person, place, and time. He exhibits normal muscle tone. Coordination normal.  Skin: Skin is warm and dry.  Psychiatric: He has a normal mood and affect.     ED Treatments / Results  Labs (all labs ordered are listed, but only abnormal results are displayed) Labs Reviewed  HIV ANTIBODY (ROUTINE TESTING W REFLEX)  CBC  COMPREHENSIVE METABOLIC PANEL  MAGNESIUM  TSH  CBC WITH DIFFERENTIAL/PLATELET  BRAIN NATRIURETIC PEPTIDE  PROTIME-INR  URINALYSIS, ROUTINE W REFLEX MICROSCOPIC  I-STAT CG4 LACTIC ACID, ED  I-STAT TROPONIN, ED    EKG EKG Interpretation  Date/Time:  Thursday August 17 2018 10:06:32 EST Ventricular Rate:  107 PR Interval:    QRS Duration: 98 QT Interval:  358 QTC Calculation: 478 R Axis:   -175 Text Interpretation:  Sinus tachycardia LAE, consider biatrial enlargement Right axis deviation Minimal ST elevation, diffuse leads Borderline prolonged QT interval no STEMI. no sig change from previous Confirmed by Charlesetta Shanks [phone]) on 08/17/2018 10:24:30 AM   Radiology No results found.  Procedures Procedures (including critical care time) CRITICAL CARE Performed by: Charlesetta Shanks   Total critical care time: 20  minutes  Critical care time was exclusive of separately billable procedures and treating other patients.  Critical care was necessary to treat or prevent imminent or  life-threatening deterioration.  Critical care was time spent personally by me on the following activities: development of treatment plan with patient and/or surrogate as well as nursing, discussions with consultants, evaluation of patient's response to treatment, examination of patient, obtaining history from patient or surrogate, ordering and performing treatments and interventions, ordering and review of laboratory studies, ordering and review of radiographic studies, pulse oximetry and re-evaluation of patient's condition. Medications Ordered in ED Medications  aspirin EC tablet 81 mg (has no administration in time range)  nitroGLYCERIN (NITROSTAT) SL tablet 0.4 mg (has no administration in time range)  acetaminophen (TYLENOL)  tablet 650 mg (has no administration in time range)  ondansetron (ZOFRAN) injection 4 mg (has no administration in time range)  enoxaparin (LOVENOX) injection 40 mg (has no administration in time range)  sodium chloride flush (NS) 0.9 % injection 3 mL (has no administration in time range)  ALPRAZolam (XANAX) tablet 0.25 mg (has no administration in time range)  furosemide (LASIX) injection 40 mg (has no administration in time range)  pantoprazole (PROTONIX) EC tablet 40 mg (has no administration in time range)  losartan (COZAAR) tablet 25 mg (has no administration in time range)     Initial Impression / Assessment and Plan / ED Course  I have reviewed the triage vital signs and the nursing notes.  Pertinent labs & imaging results that were available during my care of the patient were reviewed by me and considered in my medical decision making (see chart for details).    Patient presents with significant hypoxia.  He is transported by EMS from cardiology office.  This has been gradual in nature and occurring over almost 6 weeks duration.  Patient denies any associated chest pain.  He does have some lower extremity edema.  Chest x-ray does not show significant  vascular congestion and BNP not sniffily elevated.  Patient also has very heavy smoking history.  He has had cough with sputum production in association with this.  This is suggestive of a significant component of COPD with also possible CHF.  Patient has been given DuoNeb and Solu-Medrol.  Orders have already been placed by cardiology for Lasix and subcu Lovenox.  Patient does not have active chest pain or active ischemic changes.  Patient was transferred to emergency department with plan for admission directly to cardiology service.  Final Clinical Impressions(s) / ED Diagnoses   Final diagnoses:  Acute respiratory failure with hypoxia [facility])    ED Discharge Orders    None       Charlesetta Shanks, MD 08/17/18 1504

## 2018-08-17 NOTE — H&P (Addendum)
Cardiology Admission History and Physical:   Patient ID: James Stokes MRN: [id number]; DOB: April 16, 1956   Admission date: 08/17/2018  Primary Care Provider: Levin Bacon, NP Primary Cardiologist: Dr Gwenlyn Found  Chief Complaint:  SOB  Patient Profile:   James Stokes is a 62 y.o. male with COPD, hypertension, and daily alcohol use who was seen in the office today and found to be in respiratory failure.  History of Present Illness:   James Stokes is a pleasant 62 year old male followed by Dr. Gwenlyn Found.  He has a history of 2 pack a day smoking and daily alcohol use, 3-5 beers a day.  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  The summer Dr. Alvester Chou referred the patient to Dr. Alen Blew for hematology evaluation.  Patient's hemoglobin has been high.  Dr. Alen Blew feels this is most likely secondary to COPD.  He is also seen Dr. Debara Pickett for hypertriglyceridemia.  Dr. Debara Pickett did not feel the patient was at risk for pancreatitis.  The patient is in the office today with multiple complaints.  His wife says for the past month he has had increasing shortness of breath, increasing coughing, and increasing fatigue.  She says he falls asleep frequently during the day.  This is new for him.  He has lower extremity edema.  He also has symptoms of reflux.  In the office his O2 sat on room air is 70.  The patient is never had an echo or pulmonary evaluation.   Past Medical History:  Diagnosis Date  . Dyslipidemia   . ETOH abuse   . HTN (hypertension)   . Hypertriglyceridemia   . Kidney stones   . Smoker     Past Surgical History:  Procedure Laterality Date  . HERNIA REPAIR     pt was 36 month old  . HERNIA REPAIR     double hernia pt unsure of exact date, DR. Ballen did surgery  . lipoma  05/1989   fatty tumor on back     Medications Prior to Admission: Prior to Admission medications   Medication Sig Start Date End Date Taking? Authorizing Provider  albuterol (PROVENTIL HFA;VENTOLIN  HFA) 108 (90 Base) MCG/ACT inhaler Inhale 2 puffs into the lungs every 6 (six) hours as needed for wheezing or shortness of breath (cough, shortness of breath or wheezing.). 12/31/15   Jaynee Eagles, PA-C  amLODipine (NORVASC) 10 MG tablet TAKE 1 TABLET BY MOUTH EVERY DAY 04/10/18   Lorretta Harp, MD  aspirin 81 MG tablet Take 81 mg by mouth daily.    [provider]  lisinopril (PRINIVIL,ZESTRIL) 20 MG tablet TAKE 1 TABLET BY MOUTH DAILY. NEED OV. 04/28/18   Lorretta Harp, MD  Multiple Vitamins-Minerals (MULTIVITAMIN ADULT PO) Take 1 tablet by mouth daily.    [provider]     Allergies:    Allergies  Allergen Reactions  . Other Hives and Itching  . Shrimp [Shellfish Allergy] Itching and Rash    Social History:   Social History   Socioeconomic History  . Marital status: Married    Spouse name: Not on file  . Number of children: Not on file  . Years of education: Not on file  . Highest education level: Not on file  Occupational History  . Not on file  Social Needs  . Financial resource strain: Not on file  . Food insecurity:    Worry: Not on file    Inability: Not on file  . Transportation  needs:    Medical: Not on file    Non-medical: Not on file  Tobacco Use  . Smoking status: Current Every Day Smoker    Packs/day: 2.00    Years: 43.00    Pack years: 86.00    Types: Cigarettes  . Smokeless tobacco: Former Systems developer    Types: Chew  Substance and Sexual Activity  . Alcohol use: Yes    Alcohol/week: 15.0 standard drinks    Types: 15 Cans of beer per week    Comment: "6-10 beers a night"  . Drug use: Yes    Types: Marijuana  . Sexual activity: Not on file  Lifestyle  . Physical activity:    Days per week: Not on file    Minutes per session: Not on file  . Stress: Not on file  Relationships  . Social connections:    Talks on phone: Not on file    Gets together: Not on file    Attends religious service: Not on file    Active member of club or  organization: Not on file    Attends meetings of clubs or organizations: Not on file    Relationship status: Not on file  . Intimate partner violence:    Fear of current or ex partner: Not on file    Emotionally abused: Not on file    Physically abused: Not on file    Forced sexual activity: Not on file  Other Topics Concern  . Not on file  Social History Narrative  . Not on file    Family History:   The patient's family history includes COPD in his mother; Cancer in his father; Diabetes in his paternal aunt and paternal uncle; Hyperlipidemia in his father; Hypertension in his father; Vascular Disease (age of onset: 24) in his father.    ROS:  Please see the history of present illness.  All other ROS reviewed and negative.     Physical Exam/Data:   Vitals:   08/17/18 1007  BP: (!) 162/106  Pulse: (!) 109  Resp: (!) 22  Temp: 98.9 F (37.2 C)  TempSrc: Oral  SpO2: 91%  Weight: 86 kg  Height: 5\' 11"  (1.803 m)   No intake or output data in the 24 hours ending 08/17/18 1015 Filed Weights   08/17/18 1007  Weight: 86 kg   Body mass index is 26.44 kg/m.  Blood pressure 140/88, pulse (!) 111, height 5\' 11"  (1.803 m), weight 189 lb 12.8 oz (86.1 kg).  General appearance: alert, cooperative and mild distress Neck: no JVD Lungs: diffuse wheezing Heart: regular rate and rhythm Abdomen: not distended, liver down 3 fingers below costal margin, ventral hernia present Extremities: clubbing noted, his lower extremities have a ruddy color.  He has 1+ bilateral lower extremity edema. Pulses: 1+ DP Skin: Skin color, texture, turgor normal. No rashes or lesions or ruddy complexion Neurologic: Grossly normal   EKG:  The ECG that was done today was personally reviewed and demonstrates normal sinus rhythm sinus tachycardia right axis deviation right atrial enlargement with pulmonary disease pattern   Laboratory Data:  ChemistryNo results for input(s): NA, K, CL, CO2, GLUCOSE, BUN,  CREATININE, CALCIUM, GFRNONAA, GFRAA, ANIONGAP in the last 168 hours.  No results for input(s): PROT, ALBUMIN, AST, ALT, ALKPHOS, BILITOT in the last 168 hours. HematologyNo results for input(s): WBC, RBC, HGB, HCT, MCV, MCH, MCHC, RDW, PLT in the last 168 hours. Cardiac EnzymesNo results for input(s): TROPONINI in the last 168 hours. No results for  input(s): TROPIPOC in the last 168 hours.  BNPNo results for input(s): BNP, PROBNP in the last 168 hours.  DDimer No results for input(s): DDIMER in the last 168 hours.  Radiology/Studies:  No results found.  Assessment and Plan:   Acute on chronic respiratory failure with hypoxia [facility] James Stokes has significant underlying COPD and now has respiratory failure with evidence of hypercapnia and hypoxia.  I suspect he may have a component of right heart failure as well.  By his history he may also have sleep apnea.  COPD with hypoxia ([place]) COPD by history and exam with clubbing, elevated Hgb (19), and diffuse wheezing in a long time 2 pack a day smoker.  Current smoker 2 PPD  ETOH abuse 3-5 beers daily  Essential hypertension Will stop lisinopril and consider losartan.  With his lower extremity edema I think I would also stop his amlodipine.  Severity of Illness: The appropriate patient status for this patient is INPATIENT. Inpatient status is judged to be reasonable and necessary in order to provide the required intensity of service to ensure the patient's safety. The patient's presenting symptoms, physical exam findings, and initial radiographic and laboratory data in the context of their chronic comorbidities is felt to place them at high risk for further clinical deterioration. Furthermore, it is not anticipated that the patient will be medically stable for discharge from the hospital within 2 midnights of admission. The following factors support the patient status of inpatient.   " The patient's presenting symptoms include SOB. " The  worrisome physical exam findings include hypoxia. " The initial radiographic and laboratory data are worrisome because of low O2. " The chronic co-morbidities include HTN.   * I certify that at the point of admission it is my clinical judgment that the patient will require inpatient hospital care spanning beyond 2 midnights from the point of admission due to high intensity of service, high risk for further deterioration and high frequency of surveillance required.*    For questions or updates, please contact [place] Please consult [email or web address] for contact info under    Signed, Kerin Ransom, PA-C  08/17/2018 10:15 AM    Patient seen and examined. Agree with assessment and plan.  James Stokes is a 62 year old gentleman who is admitted from the office today with complaints of progressive increasing shortness of breath, wheezing, fatigability with excessive daytime sleepiness.  He has a very long history of tobacco use and has been smoking 2 packs/day for numerous years.  He has never been evaluated by pulmonology but undoubtedly has significant COPD and presents with respiratory failure with hypercapnia and hypoxia.  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  In the office today his O2 saturation was 70% and he was started on supplemental oxygen.  We will admit the patient to the hospital.  A 2D echo Doppler study will be necessary to assess left ventricular systolic and diastolic function as well as right ventricular size and function and pulmonary pressures.  There is no history of any awareness of a septal defect.  Will obtain pulmonary evaluation.  He will need to be initiated on bronchodilator therapy is not having any active chest pain or ischemic symptoms.  He ultimately also should undergo evaluation for sleep apnea as an outpatient once his pulmonary status is  improved.  I had a long discussion with him starting his need for smoking cessation.  Troy Sine, MD, [facility] 08/17/2018 6:16 PM

## 2018-08-17 NOTE — Progress Notes (Signed)
  Echocardiogram 2D Echocardiogram has been performed.  Madelaine Etienne 08/17/2018, 1:47 PM

## 2018-08-17 NOTE — ED Notes (Signed)
Attempted report x1. 

## 2018-08-17 NOTE — Assessment & Plan Note (Signed)
Will stop lisinopril and consider losartan.  With his lower extremity edema I think I would also stop his amlodipine.

## 2018-08-17 NOTE — Assessment & Plan Note (Signed)
3-5 beers daily

## 2018-08-17 NOTE — ED Triage Notes (Signed)
Pt arrives via EMS from PCP's office with low oxygen saturations. Reports he's been feeling more significantly short of breath for 6-8 weeks. Hx 2 pack per day smoking for 40 years, COPD, resp failure. Room air sat 72%.

## 2018-08-17 NOTE — Assessment & Plan Note (Signed)
!/

## 2018-08-17 NOTE — Progress Notes (Signed)
ABG results given to ED MD Dr Phifer, no new RT orders except add prn duoneb order.  [place] was increased to 6 lpm., sat now 92%.  ED RT aware.

## 2018-08-17 NOTE — Progress Notes (Signed)
   I was called by nursing for elevated HR and BP. HR likely up related to bronchodilators.  Pt is resting in bed comfortably. His lungs are mostly clear with good air movement. He says that he is breathing much better since neb treatments. Currently on O2 by [place].  Will start cardizem for BP control and to some extent HR.  I have called pulmonology for consult in am.   James Stokes 08/17/2018  5:32 PM Pager: [phone]

## 2018-08-18 ENCOUNTER — Other Ambulatory Visit: Payer: Self-pay

## 2018-08-18 ENCOUNTER — Inpatient Hospital Stay (HOSPITAL_COMMUNITY): Payer: 59

## 2018-08-18 DIAGNOSIS — F17200 Nicotine dependence, unspecified, uncomplicated: Secondary | ICD-10-CM

## 2018-08-18 DIAGNOSIS — I50812 Chronic right heart failure: Secondary | ICD-10-CM

## 2018-08-18 DIAGNOSIS — R683 Clubbing of fingers: Secondary | ICD-10-CM

## 2018-08-18 DIAGNOSIS — J9621 Acute and chronic respiratory failure with hypoxia: Principal | ICD-10-CM

## 2018-08-18 LAB — BASIC METABOLIC PANEL
Anion gap: 9 (ref 5–15)
BUN: 16 mg/dL (ref 6–20)
CO2: 27 mmol/L (ref 22–32)
Calcium: 8.6 mg/dL — ABNORMAL LOW (ref 8.9–10.3)
Chloride: 103 mmol/L (ref 98–111)
Creatinine, Ser: 1.25 mg/dL — ABNORMAL HIGH (ref 0.61–1.24)
GFR calc Af Amer: >60 mL/min (ref >60)
GFR calc non Af Amer: >60 mL/min (ref >60)
Glucose, Bld: 139 mg/dL — ABNORMAL HIGH (ref 70–99)
Potassium: 4.8 mmol/L (ref 3.5–5.1)
Sodium: 139 mmol/L (ref 135–145)

## 2018-08-18 LAB — HIV ANTIBODY (ROUTINE TESTING W REFLEX): HIV Screen 4th Generation wRfx: NONREACTIVE

## 2018-08-18 MED ORDER — LORAZEPAM 2 MG/ML IJ SOLN: 1.0000 mg | Freq: Four times a day (QID) | INTRAMUSCULAR | Status: DC | PRN

## 2018-08-18 MED ORDER — LORAZEPAM 1 MG PO TABS: 1.0000 mg | ORAL_TABLET | Freq: Four times a day (QID) | ORAL | Status: DC | PRN

## 2018-08-18 MED ORDER — NICOTINE 21 MG/24HR TD PT24
21.0000 mg | MEDICATED_PATCH | Freq: Every day | TRANSDERMAL | Status: DC
  Administered 2018-08-18 – 2018-08-22 (×5): 21 mg via TRANSDERMAL
  Filled 2018-08-18 (×5): qty 1

## 2018-08-18 MED ORDER — DILTIAZEM HCL ER COATED BEADS 240 MG PO CP24
240.0000 mg | ORAL_CAPSULE | Freq: Every day | ORAL | Status: DC
  Administered 2018-08-18 – 2018-08-22 (×5): 240 mg via ORAL
  Filled 2018-08-18 (×5): qty 1

## 2018-08-18 MED ORDER — THIAMINE HCL 100 MG/ML IJ SOLN: 100.0000 mg | Freq: Every day | INTRAMUSCULAR | Status: DC

## 2018-08-18 MED ORDER — HYDROCORTISONE 1 % EX CREA
TOPICAL_CREAM | Freq: Two times a day (BID) | CUTANEOUS | Status: DC
  Administered 2018-08-18 – 2018-08-21 (×7): via TOPICAL
  Administered 2018-08-21: 1 via TOPICAL
  Administered 2018-08-22: 10:00:00 via TOPICAL
  Filled 2018-08-18: qty 28

## 2018-08-18 MED ORDER — UMECLIDINIUM BROMIDE 62.5 MCG/INH IN AEPB
1.0000 | INHALATION_SPRAY | Freq: Every day | RESPIRATORY_TRACT | Status: DC
  Filled 2018-08-18: qty 7

## 2018-08-18 MED ORDER — FOLIC ACID 1 MG PO TABS
1.0000 mg | ORAL_TABLET | Freq: Every day | ORAL | Status: DC
  Administered 2018-08-18 – 2018-08-22 (×5): 1 mg via ORAL
  Filled 2018-08-18 (×5): qty 1

## 2018-08-18 MED ORDER — TIOTROPIUM BROMIDE MONOHYDRATE 18 MCG IN CAPS: 18.0000 ug | ORAL_CAPSULE | Freq: Every day | RESPIRATORY_TRACT | Status: DC

## 2018-08-18 MED ORDER — BUDESONIDE 0.25 MG/2ML IN SUSP
0.2500 mg | Freq: Two times a day (BID) | RESPIRATORY_TRACT | Status: DC
  Administered 2018-08-18 – 2018-08-21 (×6): 0.25 mg via RESPIRATORY_TRACT
  Filled 2018-08-18 (×6): qty 2

## 2018-08-18 MED ORDER — ADULT MULTIVITAMIN W/MINERALS CH
1.0000 | ORAL_TABLET | Freq: Every day | ORAL | Status: DC
  Administered 2018-08-18 – 2018-08-22 (×5): 1 via ORAL
  Filled 2018-08-18 (×5): qty 1

## 2018-08-18 MED ORDER — MOMETASONE FURO-FORMOTEROL FUM 200-5 MCG/ACT IN AERO
2.0000 | INHALATION_SPRAY | Freq: Two times a day (BID) | RESPIRATORY_TRACT | Status: DC
  Filled 2018-08-18: qty 8.8

## 2018-08-18 MED ORDER — VITAMIN B-1 100 MG PO TABS
100.0000 mg | ORAL_TABLET | Freq: Every day | ORAL | Status: DC
  Administered 2018-08-18 – 2018-08-22 (×5): 100 mg via ORAL
  Filled 2018-08-18 (×5): qty 1

## 2018-08-18 MED ORDER — HYDROCORTISONE 0.5 % EX CREA
TOPICAL_CREAM | Freq: Two times a day (BID) | CUTANEOUS | Status: DC
  Filled 2018-08-18: qty 28.35

## 2018-08-18 NOTE — Consult Note (Addendum)
NAME:  James Stokes, MRN:  [id number], DOB:  03/10/1956, LOS: 1 ADMISSION DATE:  08/17/2018, CONSULTATION DATE:  11/22 REFERRING MD:  Gwenlyn Found, CHIEF COMPLAINT:  Hypoxic respiratory failure    Brief History     History of present illness   This is a 62 year old male w/ hx as mentioned below but includes: 2 ppd/smoking, chronic polycythemia and presumed COPD. Presented to cardiology w/ multiple complaints: chronic cough,  increased shortness of breath and worsening fatigue. Also reports he's been falling asleep frequently during the day (new for him). In the office he did have lower extremity edema, clubbing of fingers, and room air sats in 70s. He was admitted for further evaluation as well as pulmonary consultation.  On admit: hgb 20, PCO2 61, PO2 48, lactate 1.4 CXR: hyperinflated, interstitial prominence, no clear infiltrate. PCCM asked to see for chronic respiratory failure    Past Medical History  2ppd smoker for > 40 years, COPD, polycythemia, ETOH abuse   Significant Hospital Events   11/21 admitted for evaluation of hypoxia   Consults:  PCCM 11/22  Procedures:    Significant Diagnostic Tests:  ECHO 11/21: LVEF 60-65%, mild LVH, doppler parameters c/w gd 1 diastolic dysfxn, LA mildy dilated, RA mildly dilated, RV mildly dilated w/ reduced RV fxn  Micro Data:    Antimicrobials:    Interim history/subjective:  Feels better since getting Lasix  Objective   Blood pressure (Abnormal) 144/87, pulse 88, temperature 98.2 F (36.8 C), temperature source Oral, resp. rate 18, height 5\' 11"  (1.803 m), weight 79.5 kg, SpO2 91 %.        Intake/Output Summary (Last 24 hours) at 08/18/2018 0953 Last data filed at 08/18/2018 0500 Gross per 24 hour  Intake 240 ml  Output 3000 ml  Net -2760 ml   Filed Weights   08/17/18 1007 08/17/18 1607 08/18/18 0540  Weight: 86 kg 80.4 kg 79.5 kg    Examination: General: Pleasant 62 year old white male currently sitting in bed he is  in no acute distress HENT: Normocephalic atraumatic no jugular venous distention mucous membranes moist Lungs: Prolonged exhale phase, axial wheeze noted no accessory use Cardiovascular: Regular rate and rhythm without murmur rub Abdomen: Soft nontender no organomegaly Extremities: Lower extremity edema improving, chronic venous stasis changes strong pulses warm clubbing noted on fingers Neuro: Alert oriented no focal deficit GU: Voids clear yellow urine  Resolved Hospital Problem list    Assessment & Plan:  Acute on chronic hypoxic and hypercarbic respiratory failure Polycythemia COPD  Tobacco abuse Decompensated Cor Pulmonale Acute diastolic HF Pulmonary edema Volume overload Possible sleep apnea    Acute on chronic Hypoxic & hypercarbic respiratory failure. Multifactorial: untreated COPD w/ ongoing tobacco abuse, and now decompensated cor pulmonale, decomp diastolic dysfxn and element of pulmonary edema/volume overload. He may also have element of sleep apnea -I think he has been hypoxic for some time.   Plan Add dulera and spiriva  Add flutter Lasix (needs volume reduction) Continue oxygen (will likely need this at dc) PFTs once volume status euvolemic Smoking cessation  PSG as out-pt We will get him set up in clinic     Best practice:  Diet: Regular Pain/Anxiety/Delirium protocol (if indicated): Not indicated VAP protocol (if indicated): Not indicated DVT prophylaxis: Lovenox GI prophylaxis: Not indicated Glucose control: Not indicated Mobility: With assist Code Status: Full code Family Communication: Mother at bedside Disposition:   Labs   CBC: Recent Labs  Lab 08/17/18 1150  WBC 8.5  NEUTROABS 6.3  HGB 20.0*  HCT 64.3*  MCV 97.7  PLT 433    Basic Metabolic Panel: Recent Labs  Lab 08/17/18 1720 08/18/18 0445  NA 139 139  K 3.8 4.8  CL 100 103  CO2 28 27  GLUCOSE 171* 139*  BUN 17 16  CREATININE 1.53* 1.25*  CALCIUM 8.5* 8.6*    GFR: Estimated Creatinine Clearance: 66.9 mL/min (A) (by C-G formula based on SCr of 1.25 mg/dL (H)). Recent Labs  Lab 08/17/18 1025 08/17/18 1150 08/17/18 1158  WBC  --  8.5  --   LATICACIDVEN 1.26  --  1.40    Liver Function Tests: No results for input(s): AST, ALT, ALKPHOS, BILITOT, PROT, ALBUMIN in the last 168 hours. No results for input(s): LIPASE, AMYLASE in the last 168 hours. No results for input(s): AMMONIA in the last 168 hours.  ABG    Component Value Date/Time   PHART 7.328 (L) 08/17/2018 1414   PCO2ART 61.1 (H) 08/17/2018 1414   PO2ART 48.0 (L) 08/17/2018 1414   HCO3 32.1 (H) 08/17/2018 1414   TCO2 34 (H) 08/17/2018 1414   O2SAT 79.0 08/17/2018 1414     Coagulation Profile: Recent Labs  Lab 08/17/18 1623  INR 1.04    Cardiac Enzymes: No results for input(s): CKTOTAL, CKMB, CKMBINDEX, TROPONINI in the last 168 hours.  HbA1C: Hgb A1c MFr Bld  Date/Time Value Ref Range Status  01/25/2017 09:01 AM 5.0 <5.7 % Final    Comment:      For the purpose of screening for the presence of diabetes:   <5.7%       Consistent with the absence of diabetes 5.7-6.4 %   Consistent with increased risk for diabetes (prediabetes) >=6.5 %     Consistent with diabetes   This assay result is consistent with a decreased risk of diabetes.   Currently, no consensus exists regarding use of hemoglobin A1c for diagnosis of diabetes in children.   According to American Diabetes Association (ADA) guidelines, hemoglobin A1c <7.0% represents optimal control in non-pregnant diabetic patients. Different metrics may apply to specific patient populations. Standards of Medical Care in Diabetes (ADA).     12/02/2015 10:08 AM 5.5 <5.7 % Final    Comment:                                                                           According to the ADA Clinical Practice Recommendations for 2011, when HbA1c is used as a screening test:     >=6.5%   Diagnostic of Diabetes Mellitus             (if abnormal result is confirmed)   5.7-6.4%   Increased risk of developing Diabetes Mellitus   References:Diagnosis and Classification of Diabetes Mellitus,Diabetes IRJJ,8841,66(AYTKZ 1):S62-S69 and Standards of Medical Care in         Diabetes - 2011,Diabetes SWFU,9323,55 (Suppl 1):S11-S61.       CBG: No results for input(s): GLUCAP in the last 168 hours.  Review of Systems:   Review of Systems - History obtained from the patient General ROS: positive for  - fatigue negative for - chills, fever, hot flashes, malaise, night sweats or sleep disturbance Psychological ROS: negative ENT ROS:  negative Allergy and Immunology ROS: negative Hematological and Lymphatic ROS: positive for - polycythemia  Endocrine ROS: negative Respiratory ROS: positive for - cough and shortness of breath negative for - hemoptysis, orthopnea, pleuritic pain, sputum changes, stridor, tachypnea or wheezing Cardiovascular ROS: no chest pain or dyspnea on exertion Gastrointestinal ROS: no abdominal pain, change in bowel habits, or black or bloody stools Genito-Urinary ROS: no dysuria, trouble voiding, or hematuria Musculoskeletal ROS: negative Neurological ROS: no TIA or stroke symptoms Past Medical History  He,  has a past medical history of Dyslipidemia, ETOH abuse, HTN (hypertension), Hypertriglyceridemia, Kidney stones, and Smoker.   Surgical History    Past Surgical History:  Procedure Laterality Date  . HERNIA REPAIR     pt was 110 month old  . HERNIA REPAIR     double hernia pt unsure of exact date, DR. Ballen did surgery  . lipoma  05/1989   fatty tumor on back     Social History   reports that he has been smoking cigarettes. He has a 86.00 pack-year smoking history. He has quit using smokeless tobacco.  His smokeless tobacco use included chew. He reports that he drinks about 15.0 standard drinks of alcohol per week. He reports that he has current or past drug history. Drug: Marijuana.    Family History   His family history includes COPD in his mother; Cancer in his father; Diabetes in his paternal aunt and paternal uncle; Hyperlipidemia in his father; Hypertension in his father; Vascular Disease (age of onset: 66) in his father.   Allergies Allergies  Allergen Reactions  . Other Hives and Itching  . Shrimp [Shellfish Allergy] Itching and Rash     Home Medications  Prior to Admission medications   Medication Sig Start Date End Date Taking? Authorizing Provider  albuterol (PROVENTIL HFA;VENTOLIN HFA) 108 (90 Base) MCG/ACT inhaler Inhale 2 puffs into the lungs every 6 (six) hours as needed for wheezing or shortness of breath (cough, shortness of breath or wheezing.). 12/31/15  Yes Jaynee Eagles, PA-C  amLODipine (NORVASC) 10 MG tablet TAKE 1 TABLET BY MOUTH EVERY DAY Patient taking differently: Take 10 mg by mouth every morning.  04/10/18  Yes Lorretta Harp, MD  aspirin 81 MG tablet Take 81 mg by mouth every morning.    Yes [provider]  lisinopril (PRINIVIL,ZESTRIL) 20 MG tablet TAKE 1 TABLET BY MOUTH DAILY. NEED OV. Patient taking differently: Take 20 mg by mouth every morning.  04/28/18  Yes Lorretta Harp, MD  Multiple Vitamins-Minerals (MULTIVITAMIN ADULT PO) Take 1 tablet by mouth every morning.    Yes [provider]     Critical care time: na     Erick Colace ACNP-BC [place] Pager # [phone] OR # [phone] if no answer

## 2018-08-18 NOTE — Progress Notes (Addendum)
Progress Note  Patient Name: James Stokes Date of Encounter: 08/18/2018  Primary Cardiologist: [name], MD   Subjective   Breathing improving with nebulizer and oxygen supplement.   Inpatient Medications    Scheduled Meds: . aspirin EC  81 mg Oral Daily  . diltiazem  240 mg Oral Daily  . enoxaparin (LOVENOX) injection  40 mg Subcutaneous Q24H  . losartan  25 mg Oral Daily  . nicotine  21 mg Transdermal Once  . pantoprazole  40 mg Oral Q0600   Continuous Infusions:  PRN Meds: acetaminophen, ALPRAZolam, ipratropium-albuterol, nitroGLYCERIN, ondansetron (ZOFRAN) IV, sodium chloride flush   Vital Signs    Vitals:   08/17/18 1945 08/17/18 2345 08/18/18 0000 08/18/18 0540  BP:  (!) 148/97  (!) 144/87  Pulse:  (!) 105  88  Resp:   20 18  Temp:  98 F (36.7 C)  98.2 F (36.8 C)  TempSrc:    Oral  SpO2: 94% 92%  91%  Weight:    79.5 kg  Height:        Intake/Output Summary (Last 24 hours) at 08/18/2018 1000 Last data filed at 08/18/2018 0500 Gross per 24 hour  Intake 240 ml  Output 3000 ml  Net -2760 ml   Filed Weights   08/17/18 1007 08/17/18 1607 08/18/18 0540  Weight: 86 kg 80.4 kg 79.5 kg    Telemetry    SR at 90-100s - Personally Reviewed  ECG    N/A  Physical Exam   GEN: No acute distress.   Neck: No JVD Cardiac: RRR, no murmurs, rubs, or gallops. Clubbing Respiratory: faint rales  GI: Soft, nontender, non-distended  MS: No edema; No deformity. Neuro:  Nonfocal  Psych: Normal affect   Labs    Chemistry Recent Labs  Lab 08/17/18 1720 08/18/18 0445  NA 139 139  K 3.8 4.8  CL 100 103  CO2 28 27  GLUCOSE 171* 139*  BUN 17 16  CREATININE 1.53* 1.25*  CALCIUM 8.5* 8.6*  GFRNONAA 48* >60  GFRAA 55* >60  ANIONGAP 11 9     Hematology Recent Labs  Lab 08/17/18 1150  WBC 8.5  RBC 6.58*  HGB 20.0*  HCT 64.3*  MCV 97.7  MCH 30.4  MCHC 31.1  RDW 15.8*  PLT 200    Cardiac EnzymesNo results for input(s):  TROPONINI in the last 168 hours.  Recent Labs  Lab 08/17/18 1023  TROPIPOC 0.01     BNP Recent Labs  Lab 08/17/18 1150  BNP 131.5*     DDimer No results for input(s): DDIMER in the last 168 hours.   Radiology    Dg Chest 2 View  Result Date: 08/17/2018 CLINICAL DATA:  Shortness of breath, productive cough EXAM: CHEST - 2 VIEW COMPARISON:  07/08/2017 FINDINGS: Mild hyperinflation. Heart is borderline in size. Peribronchial thickening and interstitial prominence, favor bronchitis. No confluent opacities or effusions. IMPRESSION: Mild hyperinflation.  Borderline cardiomegaly.  Bronchitic changes. Electronically Signed   By: Rolm Baptise M.D.   On: 08/17/2018 10:39    Cardiac Studies   Echo 08/17/18 Study Conclusions  - Left ventricle: The cavity size was normal. Wall thickness was   increased in a pattern of mild LVH. Systolic function was normal.   The estimated ejection fraction was in the range of 60% to 65%.   Wall motion was normal; there were no regional wall motion   abnormalities. Doppler parameters are consistent with abnormal   left ventricular relaxation (grade  1 diastolic dysfunction). - Aortic valve: Valve area (VTI): 2.89 cm^2. Valve area (Vmean):   2.63 cm^2. - Left atrium: The atrium was mildly dilated. - Right ventricle: The cavity size was mildly dilated. Systolic   function was mildly reduced. - Right atrium: The atrium was mildly to moderately dilated.   Patient Profile     62 y.o. male with hypertension, daily alcohol use, smoker's polycythemia and contributed by his significant probable undiagnosed COPD with on going tobacco smoking admitted from clinic due to  Acute on chronic respiratory failure with hypercapnia and hypoxia.   Assessment & Plan    1. 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  - Echo with normal LVEF, grade 1 DD and mild reduce RV  function. - Symptoms improving with breathing treatment and nebulizer. - Pending pulmonary evaluation  2. HTN - Elevated. Lisinopril changed to losartan. Started on Cardizem >> change to CD this morning. Improving.   3. Tobacco smoking - currently smokes 2 PPD. On patch  4. Alcohol abuse - Daily drinker. Will place CIWA    For questions or updates, please contact [place] Please consult [email or web address] for contact info under        Signed, Leanor Kail, PA  08/18/2018, 10:00 AM    I have seen and examined the patient along with Leanor Kail, PA .  I have reviewed the chart, notes and new data.  I agree with PA/NP's note.  Key new complaints: feels OK, but he "never felt really bad". Edema has resolved promptly after one dose of diuretics. Nicotine craving reasonably controlled with patch. Key examination changes: emphysematous chest, diminished breath sounds throughout, a few crackles/rubs in the bases, no wheezes. Prominent bilateral clubbing Key new findings / data: echo with normal LVEF, dilated right heart chambers and reduced RV function. Hyperinflation on CXR  PLAN: Chronic cor pulmonale due to advanced lung disease with what is probably severe chronic hypoxic respiratory failure. I am sure he has COPD (2-3 ppd lifelong smoker), but wonder about superimposed restrictive disease, based on exam (welder's lung?). 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 Adjusting meds for HBP, avoid beta blockers. From cardiac point of view, the treatment is smoking cessation, O2 supplementation to avoid hypoxemia, sodium restriction and diuretics (probably only required intermittently, prn edema).  Await pulmonary evaluation.  Sanda Klein, MD, [place] [phone] 08/18/2018, 10:20 AM

## 2018-08-18 NOTE — Plan of Care (Signed)
  Problem: Nutrition: Goal: Adequate nutrition will be maintained Outcome: Completed/Met   Problem: Coping: Goal: Level of anxiety will decrease Outcome: Completed/Met   Problem: Elimination: Goal: Will not experience complications related to bowel motility Outcome: Completed/Met Goal: Will not experience complications related to urinary retention Outcome: Completed/Met   Problem: Safety: Goal: Ability to remain free from injury will improve Outcome: Completed/Met   Problem: Skin Integrity: Goal: Risk for impaired skin integrity will decrease Outcome: Completed/Met   

## 2018-08-19 ENCOUNTER — Inpatient Hospital Stay (HOSPITAL_COMMUNITY): Payer: 59

## 2018-08-19 DIAGNOSIS — J9622 Acute and chronic respiratory failure with hypercapnia: Secondary | ICD-10-CM

## 2018-08-19 DIAGNOSIS — R0602 Shortness of breath: Secondary | ICD-10-CM

## 2018-08-19 DIAGNOSIS — I50813 Acute on chronic right heart failure: Secondary | ICD-10-CM

## 2018-08-19 DIAGNOSIS — I10 Essential (primary) hypertension: Secondary | ICD-10-CM

## 2018-08-19 DIAGNOSIS — J441 Chronic obstructive pulmonary disease with (acute) exacerbation: Secondary | ICD-10-CM

## 2018-08-19 LAB — BASIC METABOLIC PANEL
Anion gap: 10 (ref 5–15)
BUN: 21 mg/dL — ABNORMAL HIGH (ref 6–20)
CO2: 29 mmol/L (ref 22–32)
Calcium: 8.3 mg/dL — ABNORMAL LOW (ref 8.9–10.3)
Chloride: 98 mmol/L (ref 98–111)
Creatinine, Ser: 1.22 mg/dL (ref 0.61–1.24)
GFR calc Af Amer: >60 mL/min (ref >60)
GFR calc non Af Amer: >60 mL/min (ref >60)
Glucose, Bld: 85 mg/dL (ref 70–99)
Potassium: 3.6 mmol/L (ref 3.5–5.1)
Sodium: 137 mmol/L (ref 135–145)

## 2018-08-19 LAB — RESPIRATORY PANEL BY PCR
ADENOVIRUS-RVPPCR: NOT DETECTED
Bordetella pertussis: NOT DETECTED
CHLAMYDOPHILA PNEUMONIAE-RVPPCR: NOT DETECTED
CORONAVIRUS 229E-RVPPCR: NOT DETECTED
CORONAVIRUS OC43-RVPPCR: NOT DETECTED
Coronavirus HKU1: NOT DETECTED
Coronavirus NL63: NOT DETECTED
INFLUENZA B-RVPPCR: NOT DETECTED
Influenza A: NOT DETECTED
MYCOPLASMA PNEUMONIAE-RVPPCR: NOT DETECTED
Metapneumovirus: NOT DETECTED
PARAINFLUENZA VIRUS 1-RVPPCR: NOT DETECTED
Parainfluenza Virus 2: NOT DETECTED
Parainfluenza Virus 3: NOT DETECTED
Parainfluenza Virus 4: NOT DETECTED
RESPIRATORY SYNCYTIAL VIRUS-RVPPCR: NOT DETECTED
Rhinovirus / Enterovirus: NOT DETECTED

## 2018-08-19 LAB — SEDIMENTATION RATE: Sed Rate: 1 mm/hr (ref 0–16)

## 2018-08-19 MED ORDER — METHYLPREDNISOLONE SODIUM SUCC 125 MG IJ SOLR
60.0000 mg | Freq: Three times a day (TID) | INTRAMUSCULAR | Status: DC
  Administered 2018-08-19 – 2018-08-21 (×7): 60 mg via INTRAVENOUS
  Filled 2018-08-19 (×7): qty 2

## 2018-08-19 MED ORDER — IPRATROPIUM-ALBUTEROL 0.5-2.5 (3) MG/3ML IN SOLN
3.0000 mL | Freq: Four times a day (QID) | RESPIRATORY_TRACT | Status: DC
  Administered 2018-08-19 – 2018-08-20 (×5): 3 mL via RESPIRATORY_TRACT
  Filled 2018-08-19 (×5): qty 3

## 2018-08-19 MED ORDER — DOXYCYCLINE HYCLATE 100 MG PO TABS
100.0000 mg | ORAL_TABLET | Freq: Two times a day (BID) | ORAL | Status: DC
  Administered 2018-08-19 – 2018-08-22 (×7): 100 mg via ORAL
  Filled 2018-08-19 (×7): qty 1

## 2018-08-19 NOTE — Progress Notes (Signed)
Progress Note  Patient Name: James Stokes Date of Encounter: 08/19/2018  Primary Cardiologist: [name], MD   Subjective   He has no cardiovascular complaints.  The edema is completely gone.  No peripheral cyanosis.  Inpatient Medications    Scheduled Meds: . aspirin EC  81 mg Oral Daily  . budesonide (PULMICORT) nebulizer solution  0.25 mg Nebulization BID  . diltiazem  240 mg Oral Daily  . enoxaparin (LOVENOX) injection  40 mg Subcutaneous Q24H  . folic acid  1 mg Oral Daily  . hydrocortisone cream   Topical BID  . ipratropium-albuterol  3 mL Nebulization QID  . losartan  25 mg Oral Daily  . multivitamin with minerals  1 tablet Oral Daily  . nicotine  21 mg Transdermal Daily  . pantoprazole  40 mg Oral Q0600  . thiamine  100 mg Oral Daily   Continuous Infusions:  PRN Meds: acetaminophen, ALPRAZolam, ipratropium-albuterol, LORazepam **OR** LORazepam, nitroGLYCERIN, ondansetron (ZOFRAN) IV, sodium chloride flush   Vital Signs    Vitals:   08/19/18 0500 08/19/18 0501 08/19/18 0830 08/19/18 0831  BP: (!) 139/93     Pulse: 86     Resp: 18     Temp: 98.1 F (36.7 C)     TempSrc: Oral     SpO2: 92%  (!) 88% 95%  Weight:  79.9 kg    Height:        Intake/Output Summary (Last 24 hours) at 08/19/2018 1112 Last data filed at 08/19/2018 0900 Gross per 24 hour  Intake 1020 ml  Output 2300 ml  Net -1280 ml   Filed Weights   08/17/18 1607 08/18/18 0540 08/19/18 0501  Weight: 80.4 kg 79.5 kg 79.9 kg    Telemetry    Sinus rhythm- Personally Reviewed  ECG    New tracing- Personally Reviewed  Physical Exam  Appears comfortable at rest on oxygen GEN: No acute distress.   Neck: No JVD Cardiac: RRR, no murmurs, rubs, or gallops.  Respiratory: Clear to auscultation bilaterally with severely diminished breath sounds throughout and a few crackles in the bases but no wheezing GI: Soft, nontender, non-distended  MS: No edema; prominent bilateral  digital clubbing; no cyanosis Neuro:  Nonfocal  Psych: Normal affect   Labs    Chemistry Recent Labs  Lab 08/17/18 1720 08/18/18 0445 08/19/18 0544  NA 139 139 137  K 3.8 4.8 3.6  CL 100 103 98  CO2 28 27 29   GLUCOSE 171* 139* 85  BUN 17 16 21*  CREATININE 1.53* 1.25* 1.22  CALCIUM 8.5* 8.6* 8.3*  GFRNONAA 48* >60 >60  GFRAA 55* >60 >60  ANIONGAP 11 9 10      Hematology Recent Labs  Lab 08/17/18 1150  WBC 8.5  RBC 6.58*  HGB 20.0*  HCT 64.3*  MCV 97.7  MCH 30.4  MCHC 31.1  RDW 15.8*  PLT 200    Cardiac EnzymesNo results for input(s): TROPONINI in the last 168 hours.  Recent Labs  Lab 08/17/18 1023  TROPIPOC 0.01     BNP Recent Labs  Lab 08/17/18 1150  BNP 131.5*     DDimer No results for input(s): DDIMER in the last 168 hours.   Radiology    No results found.  HiRes CT has been performed but not yet evaluated.  Cardiac Studies    Echo 08/17/18 Study Conclusions  - Left ventricle: The cavity size was normal. Wall thickness was increased in a pattern of mild LVH. Systolic function  was normal. The estimated ejection fraction was in the range of 60% to 65%. Wall motion was normal; there were no regional wall motion abnormalities. Doppler parameters are consistent with abnormal left ventricular relaxation (grade 1 diastolic dysfunction). - Aortic valve: Valve area (VTI): 2.89 cm^2. Valve area (Vmean): 2.63 cm^2. - Left atrium: The atrium was mildly dilated. - Right ventricle: The cavity size was mildly dilated. Systolic function was mildly reduced. - Right atrium: The atrium was mildly to moderately dilated.  Patient Profile     62 y.o. male with >80-pack-year history of smoking, hypertension, polycythemia, bilateral digital clubbing presents with acute on chronic hypoxic and hypercapnic respiratory failure, undergoing work-up for COPD and possible superimposed interstitial lung disease.  Assessment & Plan    1.  Acute on  chronic respiratory failure: Improved with oxygen supplementation.  Major component of COPD is present, but suspicion for occupational interstitial lung disease.  ESR is a very low making inflammatory active lung disease unlikely, but serologies are pending.  Will defer further work-up and decision as to optimal time for discharge to the pulmonary service.  Immediate and permanent smoking cessation will be the most important intervention. 2. R CHF: Edema resolved promptly with a very low dose of diuretic.  Has cor pulmonale.  Normal left ventricular systolic function and no significant valvular abnormalities. 3. HTN: Switched to long-acting diltiazem today.  Reevaluate blood pressure and make further adjustments tomorrow.  Avoid beta-blockers.  For questions or updates, please contact [place] Please consult [email or web address] for contact info under        Signed, Sanda Klein, MD  08/19/2018, 11:12 AM

## 2018-08-19 NOTE — Progress Notes (Signed)
Respiratory panel sent, waiting for the result, droplet isolation started, spirometry ordered via secretary and RT Caryl Pina called for flutter valve to the patient  [place]ma Holter, Therapist, sports

## 2018-08-19 NOTE — Consult Note (Signed)
NAME:  James Stokes, MRN:  [id number], DOB:  [birth year], LOS: 2 ADMISSION DATE:  08/17/2018, CONSULTATION DATE:  11/22 REFERRING MD:  Gwenlyn Found, CHIEF COMPLAINT:  Hypoxic respiratory failure    Brief History    This is a 62 year old male w/ hx as mentioned below but includes: 2 ppd/smoking, chronic polycythemia and presumed COPD. Presented to cardiology w/ multiple complaints: chronic cough,  increased shortness of breath and worsening fatigue. Also reports he's been falling asleep frequently during the day (new for him). In the office he did have lower extremity edema, clubbing of fingers, and room air sats in 70s. He was admitted for further evaluation as well as pulmonary consultation.  On admit: hgb 20, PCO2 61, PO2 48, lactate 1.4 CXR: hyperinflated, interstitial prominence, no clear infiltrate. PCCM asked to see for chronic respiratory failure    Past Medical History  2ppd smoker for > 40 years, COPD, polycythemia, ETOH abuse   Significant Hospital Events   11/21 admitted for evaluation of hypoxia   Consults:  PCCM 11/22  Procedures:    Significant Diagnostic Tests:  ECHO 11/21: LVEF 60-65%, mild LVH, doppler parameters c/w gd 1 diastolic dysfxn, LA mildy dilated, RA mildly dilated, RV mildly dilated w/ reduced RV fxn  Micro Data:    Antimicrobials:    Interim history/subjective:  08/19/18 - HRCT - no ild but has basal scarring and atelectasis. EMphysema +. Feeling better after mdi changed back to nebs. Less wheezing. STill on 3L [place] at rest  Objective   Blood pressure (!) 134/91, pulse (!) 102, temperature (!) 97 F (36.1 C), temperature source Oral, resp. rate 18, height 5\' 11"  (1.803 m), weight 79.9 kg, SpO2 91 %.        Intake/Output Summary (Last 24 hours) at 08/19/2018 1446 Last data filed at 08/19/2018 1200 Gross per 24 hour  Intake 780 ml  Output 2200 ml  Net -1420 ml   Filed Weights   08/17/18 1607 08/18/18 0540 08/19/18 0501  Weight: 80.4 kg 79.5 kg  79.9 kg    Examination: General Appearance:  Looks better Head:  Normocephalic, without obvious abnormality, atraumatic. BEARD  + Eyes:  PERRL - yes, conjunctiva/corneas - clear     Ears:  Normal external ear canals, both ears Nose:  G tube - no. 3 LNC [place]+ Throat:  ETT TUBE - no , OG tube - no Neck:  Supple,  No enlargement/tenderness/nodules Lungs: improved wheezing. No distress. Crackles at bse + Heart:  S1 and S2 normal, no murmur, CVP - no.  Pressors - no Abdomen:  Soft, no masses, no organomegaly Genitalia / Rectal:  Not done Extremities:  Extremities- CLUBBING + Skin:  ntact in exposed areas . Sacral area - no Neurologic:  Sedation - none -> RASS - +1 . Moves all 4s - yes. CAM-ICU - neg . Orientation - x3+ =  LABS    PULMONARY Recent Labs  Lab 08/17/18 1414  PHART 7.328*  PCO2ART 61.1*  PO2ART 48.0*  HCO3 32.1*  TCO2 34*  O2SAT 79.0    CBC Recent Labs  Lab 08/17/18 1150  HGB 20.0*  HCT 64.3*  WBC 8.5  PLT 200    COAGULATION Recent Labs  Lab 08/17/18 1623  INR 1.04    CARDIAC  No results for input(s): TROPONINI in the last 168 hours. No results for input(s): PROBNP in the last 168 hours.   CHEMISTRY Recent Labs  Lab 08/17/18 1720 08/18/18 0445 08/19/18 0544  NA 139 139  137  K 3.8 4.8 3.6  CL 100 103 98  CO2 28 27 29   GLUCOSE 171* 139* 85  BUN 17 16 21*  CREATININE 1.53* 1.25* 1.22  CALCIUM 8.5* 8.6* 8.3*   Estimated Creatinine Clearance: 68.6 mL/min (by C-G formula based on SCr of 1.22 mg/dL).   LIVER Recent Labs  Lab 08/17/18 1623  INR 1.04     INFECTIOUS Recent Labs  Lab 08/17/18 1025 08/17/18 1158  LATICACIDVEN 1.26 1.40     ENDOCRINE CBG (last 3)  No results for input(s): GLUCAP in the last 72 hours.       IMAGING x48h  - image(s) personally visualized  -   highlighted in bold Ct Chest High Resolution  Result Date: 08/19/2018 CLINICAL DATA:  62 year old male with history of chronic dyspnea and hypoxemia  of unknown cause. Evaluate for interstitial lung disease. EXAM: CT CHEST WITHOUT CONTRAST TECHNIQUE: Multidetector CT imaging of the chest was performed following the standard protocol without intravenous contrast. High resolution imaging of the lungs, as well as inspiratory and expiratory imaging, was performed. COMPARISON:  No priors. FINDINGS: Cardiovascular: Heart size is normal. There is no significant pericardial fluid, thickening or pericardial calcification. There is aortic atherosclerosis, as well as atherosclerosis of the great vessels of the mediastinum and the coronary arteries, including calcified atherosclerotic plaque in the left main, left anterior descending, left circumflex and right coronary arteries. Mediastinum/Nodes: No pathologically enlarged mediastinal or hilar lymph nodes. Please note that accurate exclusion of hilar adenopathy is limited on noncontrast CT scans. Esophagus is unremarkable in appearance. No axillary lymphadenopathy. Lungs/Pleura: There are some areas of dependent ground-glass attenuation and mild septal thickening in the lower lobes of the lungs bilaterally which normalize on prone high-resolution imaging, indicative of benign areas of atelectasis. High-resolution imaging otherwise demonstrates no significant regions of ground-glass attenuation, subpleural reticulation, parenchymal banding, traction bronchiectasis or honeycombing. Inspiratory and expiratory imaging is unremarkable. Diffuse bronchial wall thickening with mild centrilobular and paraseptal emphysema. No acute consolidative airspace disease. No pleural effusions. No suspicious appearing pulmonary nodules or masses are noted. Upper Abdomen: Aortic atherosclerosis. Musculoskeletal: There are no aggressive appearing lytic or blastic lesions noted in the visualized portions of the skeleton. IMPRESSION: 1. No evidence of interstitial lung disease. 2. Mild diffuse bronchial wall thickening with mild centrilobular and  paraseptal emphysema; imaging findings suggestive of underlying COPD. 3. Aortic atherosclerosis, in addition to left main and 3 vessel coronary artery disease. Please note that although the presence of coronary artery calcium documents the presence of coronary artery disease, the severity of this disease and any potential stenosis cannot be assessed on this non-gated CT examination. Assessment for potential risk factor modification, dietary therapy or pharmacologic therapy may be warranted, if clinically indicated. Aortic Atherosclerosis (ICD10-I70.0) and Emphysema (ICD10-J43.9). Electronically Signed   By: Vinnie Langton M.D.   On: 08/19/2018 14:21    Assessment & Plan:  Acute on chronic hypoxic and hypercarbic respiratory failure Polycythemia COPD  Tobacco abuse Decompensated Cor Pulmonale Acute diastolic HF Pulmonary edema Volume overload Possible sleep apnea   Overall features now c/w AECOPD. Had risk profile for ILD but HRCT show basal atelectasis and chronic post inflammatory scar (not ILD fibrosis)  PLAN - check RVP - po doxy - IV steroid start - o2 for pulse ox > 88%  - check PFT in house  - continue nebs BD (he feels nebs are better) - check abg in morning in few days to determine night bipap need - check alpha 1AT  Wife and patient updated    Best practice:  Diet: Regular Pain/Anxiety/Delirium protocol (if indicated): Not indicated VAP protocol (if indicated): Not indicated DVT prophylaxis: Lovenox GI prophylaxis: Not indicated Glucose control: Not indicated Mobility: With assist Code Status: Full code Family Communication: Mother at bedside Disposition:     SIGNATURE    Dr. Brand Males, M.D., F.C.C.P,  Pulmonary and Critical Care Medicine Staff Physician, [place] Director - Interstitial Lung Disease  Program  Pulmonary [place] [facility] [place], [place], [address]  Pager: [phone], If  no answer or between  15:00h - 7:00h: call [phone] Telephone: [phone]  2:55 PM 08/19/2018

## 2018-08-19 NOTE — Progress Notes (Signed)
VASCULAR LAB PRELIMINARY  PRELIMINARY  PRELIMINARY  PRELIMINARY  Bilateral lower extremity venous duplex completed.    Preliminary report:  There is no DVT or SVT noted in the bilateral lower extremities.   Jenina Moening, RVT 08/19/2018, 11:36 AM

## 2018-08-20 LAB — MPO/PR-3 (ANCA) ANTIBODIES: ANCA Proteinase 3: <3.5 U/mL (ref 0.0–3.5)

## 2018-08-20 LAB — MAGNESIUM: Magnesium: 2.3 mg/dL (ref 1.7–2.4)

## 2018-08-20 LAB — RHEUMATOID FACTOR: Rhuematoid fact SerPl-aCnc: <10 IU/mL (ref 0.0–13.9)

## 2018-08-20 LAB — CYCLIC CITRUL PEPTIDE ANTIBODY, IGG/IGA: CCP Antibodies IgG/IgA: 29 units — ABNORMAL HIGH (ref 0–19)

## 2018-08-20 LAB — PHOSPHORUS: Phosphorus: 3.7 mg/dL (ref 2.5–4.6)

## 2018-08-20 MED ORDER — IPRATROPIUM-ALBUTEROL 0.5-2.5 (3) MG/3ML IN SOLN
3.0000 mL | Freq: Three times a day (TID) | RESPIRATORY_TRACT | Status: DC
  Administered 2018-08-20 – 2018-08-21 (×3): 3 mL via RESPIRATORY_TRACT
  Filled 2018-08-20 (×3): qty 3

## 2018-08-20 NOTE — Progress Notes (Signed)
Progress Note  Patient Name: James Stokes Date of Encounter: 08/20/2018  Primary Cardiologist: [name], MD   Subjective   Continues to diurese well, despite no diuretics since 08/17/2018. Net negative 3L yesterday, without diuretics. No edema. Feels well.   Inpatient Medications    Scheduled Meds: . aspirin EC  81 mg Oral Daily  . budesonide (PULMICORT) nebulizer solution  0.25 mg Nebulization BID  . diltiazem  240 mg Oral Daily  . doxycycline  100 mg Oral Q12H  . enoxaparin (LOVENOX) injection  40 mg Subcutaneous Q24H  . folic acid  1 mg Oral Daily  . hydrocortisone cream   Topical BID  . ipratropium-albuterol  3 mL Nebulization QID  . losartan  25 mg Oral Daily  . methylPREDNISolone (SOLU-MEDROL) injection  60 mg Intravenous Q8H  . multivitamin with minerals  1 tablet Oral Daily  . nicotine  21 mg Transdermal Daily  . pantoprazole  40 mg Oral Q0600  . thiamine  100 mg Oral Daily   Continuous Infusions:  PRN Meds: acetaminophen, ALPRAZolam, ipratropium-albuterol, LORazepam **OR** LORazepam, nitroGLYCERIN, ondansetron (ZOFRAN) IV, sodium chloride flush   Vital Signs    Vitals:   08/20/18 0527 08/20/18 0536 08/20/18 0807 08/20/18 0808  BP:  137/81    Pulse: 70 92    Resp:  20    Temp:  97.6 F (36.4 C)    TempSrc:  Oral    SpO2: 100% 94% 90% 90%  Weight:  79.5 kg    Height:        Intake/Output Summary (Last 24 hours) at 08/20/2018 0943 Last data filed at 08/20/2018 0539 Gross per 24 hour  Intake 960 ml  Output 4050 ml  Net -3090 ml   Filed Weights   08/18/18 0540 08/19/18 0501 08/20/18 0536  Weight: 79.5 kg 79.9 kg 79.5 kg    Telemetry    NSR - Personally Reviewed  ECG    No new tracing - Personally Reviewed  Physical Exam  Looks comfortable GEN: No acute distress.   Neck: No JVD Cardiac: RRR, no murmurs, rubs, or gallops.  Respiratory: diminished breath sounds, no wheezes, a few basal crackles GI: Soft, nontender,  non-distended  MS: No edema; No deformity. Neuro:  Nonfocal  Psych: Normal affect   Labs    Chemistry Recent Labs  Lab 08/17/18 1720 08/18/18 0445 08/19/18 0544  NA 139 139 137  K 3.8 4.8 3.6  CL 100 103 98  CO2 _0 GLUCOSE 171* 139* 85  BUN 17 16 21*  CREATININE 1.53* 1.25* 1.22  CALCIUM 8.5* 8.6* 8.3*  GFRNONAA 48* >60 >60  GFRAA 55* >60 >60  ANIONGAP _1 Hematology Recent Labs  Lab 08/17/18 1150  WBC 8.5  RBC 6.58*  HGB 20.0*  HCT 64.3*  MCV 97.7  MCH 30.4  MCHC 31.1  RDW 15.8*  PLT 200    Cardiac EnzymesNo results for input(s): TROPONINI in the last 168 hours.  Recent Labs  Lab 08/17/18 1023  TROPIPOC 0.01     BNP Recent Labs  Lab 08/17/18 1150  BNP 131.5*     DDimer No results for input(s): DDIMER in the last 168 hours.   Radiology    Ct Chest High Resolution  Result Date: 08/19/2018 CLINICAL DATA:  62 year old male with history of chronic dyspnea and hypoxemia of unknown cause. Evaluate for interstitial lung disease. EXAM: CT CHEST WITHOUT CONTRAST TECHNIQUE: Multidetector CT imaging of the chest  was performed following the standard protocol without intravenous contrast. High resolution imaging of the lungs, as well as inspiratory and expiratory imaging, was performed. COMPARISON:  No priors. FINDINGS: Cardiovascular: Heart size is normal. There is no significant pericardial fluid, thickening or pericardial calcification. There is aortic atherosclerosis, as well as atherosclerosis of the great vessels of the mediastinum and the coronary arteries, including calcified atherosclerotic plaque in the left main, left anterior descending, left circumflex and right coronary arteries. Mediastinum/Nodes: No pathologically enlarged mediastinal or hilar lymph nodes. Please note that accurate exclusion of hilar adenopathy is limited on noncontrast CT scans. Esophagus is unremarkable in appearance. No axillary lymphadenopathy. Lungs/Pleura: There  are some areas of dependent ground-glass attenuation and mild septal thickening in the lower lobes of the lungs bilaterally which normalize on prone high-resolution imaging, indicative of benign areas of atelectasis. High-resolution imaging otherwise demonstrates no significant regions of ground-glass attenuation, subpleural reticulation, parenchymal banding, traction bronchiectasis or honeycombing. Inspiratory and expiratory imaging is unremarkable. Diffuse bronchial wall thickening with mild centrilobular and paraseptal emphysema. No acute consolidative airspace disease. No pleural effusions. No suspicious appearing pulmonary nodules or masses are noted. Upper Abdomen: Aortic atherosclerosis. Musculoskeletal: There are no aggressive appearing lytic or blastic lesions noted in the visualized portions of the skeleton. IMPRESSION: 1. No evidence of interstitial lung disease. 2. Mild diffuse bronchial wall thickening with mild centrilobular and paraseptal emphysema; imaging findings suggestive of underlying COPD. 3. Aortic atherosclerosis, in addition to left main and 3 vessel coronary artery disease. Please note that although the presence of coronary artery calcium documents the presence of coronary artery disease, the severity of this disease and any potential stenosis cannot be assessed on this non-gated CT examination. Assessment for potential risk factor modification, dietary therapy or pharmacologic therapy may be warranted, if clinically indicated. Aortic Atherosclerosis (ICD10-I70.0) and Emphysema (ICD10-J43.9). Electronically Signed   By: Daniel  Entrikin M.D.   On: 08/19/2018 14:21    Cardiac Studies   Echo 08/17/18  Study Conclusions      - Left ventricle: The cavity size was normal. Wall thickness was  increased in a pattern of mild LVH. Systolic function was normal.  The estimated ejection fraction was in the range of 60% to 65%.  Wall motion was normal; there were no regional wall motion   abnormalities. Doppler parameters are consistent with abnormal  left ventricular relaxation (grade 1 diastolic dysfunction).  - Left atrium: The atrium was mildly dilated. - Right ventricle: The cavity size was mildly dilated. Systolicfunction was mildly reduced. - Right atrium: The atrium was mildly to moderately dilated.  Patient Profile     62 y.o. male  >80-pack-year history of smoking, hypertension, polycythemia, bilateral digital clubbing presents with acute on chronic hypoxic and hypercapnic respiratory failure, right heart failure, undergoing work-up for COPD and possible superimposed interstitial lung disease.  Assessment & Plan    1.  Acute on chronic respiratory failure: Improved with oxygen supplementation.  Major component of COPD is present, but suspicion for occupational interstitial lung disease.  Neither CT nor ESR of 1 support active interstitial lung disease. PFTs tomorrow.  2. R CHF: Edema resolved promptly with a single dose of diuretic.  Ongoing diuresis is attributable to improved RV function with treatment of hypoxia.  Has chronic cor pulmonale.  Normal left ventricular systolic function and no significant valvular abnormalities. 3. HTN: BP fair on diltiazem/losartan. Avoid beta blockers..  For questions or updates, please contact [facility] Please consult [email or web address] for contact info under          Signed, Sanda Klein, MD  08/20/2018, 9:43 AM

## 2018-08-20 NOTE — Progress Notes (Signed)
SATURATION QUALIFICATIONS: (This note is used to comply with regulatory documentation for home oxygen)  Patient Saturations on Room Air at Rest = 89%  Patient Saturations on Room Air while Ambulating = 86%  Patient Saturations on 3 Liters of oxygen while Ambulating = 92%  Pt ambulated in a hallway without distress but he desaturation  to 86%  Palma Holter, Therapist, sports

## 2018-08-21 LAB — ANTINUCLEAR ANTIBODIES, IFA: ANTINUCLEAR ANTIBODIES, IFA: NEGATIVE

## 2018-08-21 LAB — SJOGRENS SYNDROME-A EXTRACTABLE NUCLEAR ANTIBODY: SSA (Ro) (ENA) Antibody, IgG: <0.2 AI (ref 0.0–0.9)

## 2018-08-21 LAB — ANTI-DNA ANTIBODY, DOUBLE-STRANDED: DS DNA AB: 4 [IU]/mL (ref 0–9)

## 2018-08-21 LAB — ANTI-SCLERODERMA ANTIBODY: Scleroderma (Scl-70) (ENA) Antibody, IgG: <0.2 AI (ref 0.0–0.9)

## 2018-08-21 LAB — SJOGRENS SYNDROME-B EXTRACTABLE NUCLEAR ANTIBODY: SSB (La) (ENA) Antibody, IgG: <0.2 AI (ref 0.0–0.9)

## 2018-08-21 LAB — MAGNESIUM: Magnesium: 2.3 mg/dL (ref 1.7–2.4)

## 2018-08-21 LAB — PHOSPHORUS: Phosphorus: 2.9 mg/dL (ref 2.5–4.6)

## 2018-08-21 MED ORDER — FLUTICASONE FUROATE-VILANTEROL 100-25 MCG/INH IN AEPB
1.0000 | INHALATION_SPRAY | Freq: Every day | RESPIRATORY_TRACT | Status: DC
  Administered 2018-08-22: 1 via RESPIRATORY_TRACT
  Filled 2018-08-21: qty 28

## 2018-08-21 MED ORDER — UMECLIDINIUM BROMIDE 62.5 MCG/INH IN AEPB
1.0000 | INHALATION_SPRAY | Freq: Every day | RESPIRATORY_TRACT | Status: DC
  Administered 2018-08-22: 1 via RESPIRATORY_TRACT
  Filled 2018-08-21: qty 7

## 2018-08-21 MED ORDER — PREDNISONE 20 MG PO TABS
20.0000 mg | ORAL_TABLET | Freq: Every day | ORAL | Status: DC
  Administered 2018-08-22: 20 mg via ORAL
  Filled 2018-08-21: qty 1

## 2018-08-21 MED ORDER — ALBUTEROL SULFATE (2.5 MG/3ML) 0.083% IN NEBU: 2.5000 mg | INHALATION_SOLUTION | RESPIRATORY_TRACT | Status: DC | PRN

## 2018-08-21 NOTE — Progress Notes (Signed)
NAME:  James Stokes, MRN:  [id number], DOB:  [birth year], LOS: 4 ADMISSION DATE:  08/17/2018, CONSULTATION DATE:  11/22 REFERRING MD:  Gwenlyn Found, CHIEF COMPLAINT:  Hypoxic respiratory failure    Brief History   62 yo male presented with cough, dyspnea, fatigue, hypersomnolence, hypoxia, hypercapnia.  Past Medical History  COPD, Polycythemia, ETOH, HLD, Nephrolithiasis  Significant Hospital Events   11/21 admitted for evaluation of hypoxia   Consults:    Procedures:    Significant Diagnostic Tests:  ECHO 11/21 >> LVEF 60-65%, mild LVH, doppler parameters c/w gd 1 diastolic dysfxn, LA mildy dilated, RA mildly dilated, RV mildly dilated w/ reduced RV fxn HRCT chest 11/23 >> atelectasis, diffuse bronchial wall thickening, mild centrilobular/paraseptal emphysema  Micro Data:  RSV 11/23 >> negative  Antimicrobials:  Doxycycline 11/23 >>   Interim history/subjective:  Not having as much cough or sputum.  Denies chest pain.  Objective   Blood pressure (!) 147/87, pulse (!) 101, temperature 98 F (36.7 C), temperature source Oral, resp. rate 18, height 5\' 11"  (1.803 m), weight 80.5 kg, SpO2 96 %.        Intake/Output Summary (Last 24 hours) at 08/21/2018 1438 Last data filed at 08/21/2018 1034 Gross per 24 hour  Intake 960 ml  Output 2200 ml  Net -1240 ml   Filed Weights   08/19/18 0501 08/20/18 0536 08/21/18 0516  Weight: 79.9 kg 79.5 kg 80.5 kg    Examination:  General - alert, wearing oxygen Eyes - pupils reactive ENT - no sinus tenderness, no stridor Cardiac - regular rate/rhythm, no murmur Chest - decreased BS Abdomen - soft, non tender, + bowel sounds Extremities - no cyanosis, clubbing, or edema Skin - no rashes Lymphatics - no lymphadenopathy Neuro - CN intact, normal strength, moves extremities, follows commands Psych - normal mood and behavior     Assessment & Plan:   Acute on chronic hypoxic and hypercarbic respiratory failure Plan - oxygen  to keep SpO2 90 to 95% - will need to assess for home oxygen  COPD Plan - will try changing him incruse and breo with prn albuterol - change from solumedrol to prednisone - day 4/7 of doxycycline - PFT's can be done as an outpt if unable to be completed soon in hospital  Polycythemia >> likely from chronic hypoxia. Plan - f/u CBC  Sleep disordered breathing. Plan  - will need further sleep assessment as outpt  Acute diastolic CHF. Plan  - per cardiology   Labs    CBC Latest Ref Rng & Units 08/17/2018 07/08/2017 01/25/2017  WBC 4.0 - 10.5 K/uL 8.5 7.4 8.7  Hemoglobin 13.0 - 17.0 g/dL 20.0(H) 19.6(H) 18.7(H)  Hematocrit 39.0 - 52.0 % 64.3(H) 55.1(H) 53.3(H)  Platelets 150 - 400 K/uL 200 186 198    CMP Latest Ref Rng & Units 08/19/2018 08/18/2018 08/17/2018  Glucose 70 - 99 mg/dL 85 139(H) 171(H)  BUN 6 - 20 mg/dL 21(H) 16 17  Creatinine 0.61 - 1.24 mg/dL 1.22 1.25(H) 1.53(H)  Sodium 135 - 145 mmol/L 137 139 139  Potassium 3.5 - 5.1 mmol/L 3.6 4.8 3.8  Chloride 98 - 111 mmol/L 98 103 100  CO2 22 - 32 mmol/L 29 27 28   Calcium 8.9 - 10.3 mg/dL 8.3(L) 8.6(L) 8.5(L)  Total Protein 6.0 - 8.5 g/dL - - -  Total Bilirubin 0.0 - 1.2 mg/dL - - -  Alkaline Phos 39 - 117 IU/L - - -  AST 0 - 40 IU/L - - -  ALT 0 - 44 IU/L - - -   ABG    Component Value Date/Time   PHART 7.328 (L) 08/17/2018 1414   PCO2ART 61.1 (H) 08/17/2018 1414   PO2ART 48.0 (L) 08/17/2018 1414   HCO3 32.1 (H) 08/17/2018 1414   TCO2 34 (H) 08/17/2018 1414   O2SAT 79.0 08/17/2018 [place], MD [place] 08/21/2018, 3:19 PM

## 2018-08-21 NOTE — Progress Notes (Addendum)
Progress Note  Patient Name: James Stokes Date of Encounter: 08/21/2018  Primary Cardiologist: [name], MD   Subjective   No chest pain, breathing feels well, but he did not notice a problem on admit.  For PFTs today   Inpatient Medications    Scheduled Meds: . aspirin EC  81 mg Oral Daily  . budesonide (PULMICORT) nebulizer solution  0.25 mg Nebulization BID  . diltiazem  240 mg Oral Daily  . doxycycline  100 mg Oral Q12H  . enoxaparin (LOVENOX) injection  40 mg Subcutaneous Q24H  . folic acid  1 mg Oral Daily  . hydrocortisone cream   Topical BID  . ipratropium-albuterol  3 mL Nebulization TID  . losartan  25 mg Oral Daily  . methylPREDNISolone (SOLU-MEDROL) injection  60 mg Intravenous Q8H  . multivitamin with minerals  1 tablet Oral Daily  . nicotine  21 mg Transdermal Daily  . pantoprazole  40 mg Oral Q0600  . thiamine  100 mg Oral Daily   Continuous Infusions:  PRN Meds: acetaminophen, ALPRAZolam, ipratropium-albuterol, LORazepam **OR** LORazepam, nitroGLYCERIN, ondansetron (ZOFRAN) IV, sodium chloride flush   Vital Signs    Vitals:   08/20/18 1956 08/21/18 0514 08/21/18 0516 08/21/18 0845  BP:  (!) 148/92    Pulse:  92    Resp:  18    Temp:  97.8 F (36.6 C)    TempSrc:  Oral    SpO2: 91% 92%  97%  Weight:   80.5 kg   Height:        Intake/Output Summary (Last 24 hours) at 08/21/2018 0940 Last data filed at 08/21/2018 0516 Gross per 24 hour  Intake 960 ml  Output 1900 ml  Net -940 ml   Filed Weights   08/19/18 0501 08/20/18 0536 08/21/18 0516  Weight: 79.9 kg 79.5 kg 80.5 kg    Telemetry    SR to  ST occ PVCs  - Personally Reviewed  ECG    No new - Personally Reviewed  Physical Exam   GEN: No acute distress.   Neck: No JVD Cardiac: RRR, no murmurs, rubs, or gallops.  Respiratory: breath sounds to auscultation bilaterally with rhonichi . GI: Soft, nontender, non-distended  MS: No edema; No deformity. 2+  pedals. Neuro:  Nonfocal  Psych: Normal affect   Labs    Chemistry Recent Labs  Lab 08/17/18 1720 08/18/18 0445 08/19/18 0544  NA 139 139 137  K 3.8 4.8 3.6  CL 100 103 98  CO2 28 27 29   GLUCOSE 171* 139* 85  BUN 17 16 21*  CREATININE 1.53* 1.25* 1.22  CALCIUM 8.5* 8.6* 8.3*  GFRNONAA 48* >60 >60  GFRAA 55* >60 >60  ANIONGAP 11 9 10      Hematology Recent Labs  Lab 08/17/18 1150  WBC 8.5  RBC 6.58*  HGB 20.0*  HCT 64.3*  MCV 97.7  MCH 30.4  MCHC 31.1  RDW 15.8*  PLT 200    Cardiac EnzymesNo results for input(s): TROPONINI in the last 168 hours.  Recent Labs  Lab 08/17/18 1023  TROPIPOC 0.01     BNP Recent Labs  Lab 08/17/18 1150  BNP 131.5*     DDimer No results for input(s): DDIMER in the last 168 hours.   Radiology    Vas [place] Lower Extremity Venous (dvt)  Result Date: 08/20/2018  Lower Venous Study Indications: SOB.  Comparison Study: No prior study on file Performing Technologist: Sharion Dove RVS  Examination Guidelines: A complete  evaluation includes B-mode imaging, spectral Doppler, color Doppler, and power Doppler as needed of all accessible portions of each vessel. Bilateral testing is considered an integral part of a complete examination. Limited examinations for reoccurring indications may be performed as noted.  Right Venous Findings: +---------+---------------+---------+-----------+----------+-------+          CompressibilityPhasicitySpontaneityPropertiesSummary +---------+---------------+---------+-----------+----------+-------+ CFV      Full           Yes      Yes                          +---------+---------------+---------+-----------+----------+-------+ SFJ      Full                                                 +---------+---------------+---------+-----------+----------+-------+ FV Prox  Full                                                 +---------+---------------+---------+-----------+----------+-------+  FV Mid   Full                                                 +---------+---------------+---------+-----------+----------+-------+ FV DistalFull                                                 +---------+---------------+---------+-----------+----------+-------+ PFV      Full                                                 +---------+---------------+---------+-----------+----------+-------+ POP      Full           Yes      Yes                          +---------+---------------+---------+-----------+----------+-------+ PTV      Full                                                 +---------+---------------+---------+-----------+----------+-------+ PERO     Full                                                 +---------+---------------+---------+-----------+----------+-------+ GSV      Full                                                 +---------+---------------+---------+-----------+----------+-------+  Left Venous Findings: +---------+---------------+---------+-----------+----------+-------+  CompressibilityPhasicitySpontaneityPropertiesSummary +---------+---------------+---------+-----------+----------+-------+ CFV      Full           Yes      Yes                          +---------+---------------+---------+-----------+----------+-------+ SFJ      Full                                                 +---------+---------------+---------+-----------+----------+-------+ FV Prox  Full                                                 +---------+---------------+---------+-----------+----------+-------+ FV Mid   Full                                                 +---------+---------------+---------+-----------+----------+-------+ FV DistalFull                                                 +---------+---------------+---------+-----------+----------+-------+ PFV      Full                                                  +---------+---------------+---------+-----------+----------+-------+ POP      Full                                                 +---------+---------------+---------+-----------+----------+-------+ PTV      Full                                                 +---------+---------------+---------+-----------+----------+-------+ PERO     Full                                                 +---------+---------------+---------+-----------+----------+-------+ GSV      Full                                                 +---------+---------------+---------+-----------+----------+-------+    Summary: Right: There is no evidence of deep vein thrombosis in the lower extremity.There is no evidence of superficial venous thrombosis. Left: There is no evidence of deep vein thrombosis in the lower extremity.There is no evidence of superficial venous thrombosis.  *See table(s) above for measurements and observations. Electronically signed  by Harold Barban MD on 08/20/2018 at 11:54:02 AM.    Final     Cardiac Studies   08/17/18 Echo   Study Conclusions  - Left ventricle: The cavity size was normal. Wall thickness was   increased in a pattern of mild LVH. Systolic function was normal.   The estimated ejection fraction was in the range of 60% to 65%.   Wall motion was normal; there were no regional wall motion   abnormalities. Doppler parameters are consistent with abnormal   left ventricular relaxation (grade 1 diastolic dysfunction). - Aortic valve: Valve area (VTI): 2.89 cm^2. Valve area (Vmean):   2.63 cm^2. - Left atrium: The atrium was mildly dilated. - Right ventricle: The cavity size was mildly dilated. Systolic   function was mildly reduced. - Right atrium: The atrium was mildly to moderately dilated.  08/19/18 no DVT bil. On venous dopplers   Patient Profile     62 y.o. male >80-pack-year history of smoking, hypertension, polycythemia, bilateral digital  clubbing presents with acute on chronic hypoxic and hypercapnic respiratory failure, right heart failure, undergoing work-up for COPD and possible superimposed interstitial lung disease.   Assessment & Plan    Acute on chronic respiratory failure per pulmonary COPD sp02 was 70% on admit  --resp panel neg --now on steroids IV, needs PFTs today --on nebs as well  Tobacco use 2 ppd discussed stopping. Now with nicoderm patch   Polycythemia--hgb 20   Acute diastolic HF  --neg 2130 though wt is same as admit.   (normal EF) --Cr 1.22 down from 1.53  --K+ 3.6, Mg+ 2.3  Off lasix  CAD by CTA of chest  --  There is aortic atherosclerosis, as well as atherosclerosis of the great vessels of the mediastinum and the coronary arteries, including calcified atherosclerotic plaque in the left main, left anterior descending, left circumflex and right coronary arteries. --No chest pain, may need nuc study in future vs. Cardiac cath or Coronary CTA with FFR.  Defer to Dr. Marlou Porch.  OSA per pulmonary.   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 Agree with above.  PFTs are getting done today. Overall no chest pain, no bleeding.  Still with shortness of breath not at baseline.  Lungs demonstrate poor breath sounds bilaterally, regular rate and rhythm, no significant edema, alert  Occasional PVCs noted on telemetry.  Sinus rhythm.  Echo with EF 65% dilated RV.  No DVT.  62 year old admitted from cardiology clinic with shortness of breath, hypoxia.  Acute on chronic respiratory failure - Appreciate pulmonary service.  Steroids.  PFTs.  Respiratory panel negative. Newly diagnosed COPD. Dr. Chase Caller  Tobacco  use -Continue to encourage cessation  Polycythemia -Hemoglobin 20 from smoking.  Acute diastolic heart failure - 7.7 L negative?  Weight the same Currently off of Lasix.  CAD noted on CT scan of chest -Aortic atherosclerosis as well.  Continue with secondary risk factor prevention. -Consider outpatient nuclear stress test for further risk stratification.  Continue to encourage tobacco cessation/alcohol cessation.  Candee Furbish, MD

## 2018-08-22 ENCOUNTER — Encounter (HOSPITAL_COMMUNITY): Payer: Self-pay | Admitting: Physician Assistant

## 2018-08-22 ENCOUNTER — Inpatient Hospital Stay (HOSPITAL_COMMUNITY): Payer: 59

## 2018-08-22 DIAGNOSIS — I7090 Unspecified atherosclerosis: Secondary | ICD-10-CM

## 2018-08-22 DIAGNOSIS — I5031 Acute diastolic (congestive) heart failure: Secondary | ICD-10-CM

## 2018-08-22 DIAGNOSIS — R0902 Hypoxemia: Secondary | ICD-10-CM

## 2018-08-22 HISTORY — DX: Unspecified atherosclerosis: I70.90

## 2018-08-22 HISTORY — DX: Acute diastolic (congestive) heart failure: I50.31

## 2018-08-22 LAB — COMPREHENSIVE METABOLIC PANEL
ALT: 49 U/L — ABNORMAL HIGH (ref 0–44)
ANION GAP: 7 (ref 5–15)
AST: 24 U/L (ref 15–41)
Albumin: 2.9 g/dL — ABNORMAL LOW (ref 3.5–5.0)
Alkaline Phosphatase: 76 U/L (ref 38–126)
BUN: 33 mg/dL — ABNORMAL HIGH (ref 6–20)
CALCIUM: 8.8 mg/dL — AB (ref 8.9–10.3)
CO2: 29 mmol/L (ref 22–32)
CREATININE: 1.23 mg/dL (ref 0.61–1.24)
Chloride: 102 mmol/L (ref 98–111)
GLUCOSE: 141 mg/dL — AB (ref 70–99)
Potassium: 4.7 mmol/L (ref 3.5–5.1)
SODIUM: 138 mmol/L (ref 135–145)
TOTAL PROTEIN: 5.5 g/dL — AB (ref 6.5–8.1)
Total Bilirubin: 0.6 mg/dL (ref 0.3–1.2)

## 2018-08-22 LAB — PULMONARY FUNCTION TEST
DL/VA % pred: 65 %
DL/VA: 3.05 ml/min/mmHg/L
DLCO UNC: 14.69 ml/min/mmHg
DLCO cor % pred: 38 %
DLCO cor: 13.06 ml/min/mmHg
DLCO unc % pred: 43 %
FEF 25-75 PRE: 1.11 L/s
FEF2575-%Pred-Pre: 36 %
FEV1-%PRED-PRE: 47 %
FEV1-PRE: 1.77 L
FEV1FVC-%Pred-Pre: 90 %
FEV6-%PRED-PRE: 52 %
FEV6-Pre: 2.49 L
FEV6FVC-%PRED-PRE: 102 %
FVC-%PRED-PRE: 52 %
FVC-PRE: 2.58 L
PRE FEV1/FVC RATIO: 68 %
Pre FEV6/FVC Ratio: 98 %

## 2018-08-22 LAB — PHOSPHORUS: PHOSPHORUS: 4.7 mg/dL — AB (ref 2.5–4.6)

## 2018-08-22 LAB — MAGNESIUM: Magnesium: 2.2 mg/dL (ref 1.7–2.4)

## 2018-08-22 MED ORDER — LOSARTAN POTASSIUM 50 MG PO TABS: 50.0000 mg | ORAL_TABLET | Freq: Every day | ORAL | Status: DC

## 2018-08-22 MED ORDER — DILTIAZEM HCL ER COATED BEADS 180 MG PO CP24: 300.0000 mg | ORAL_CAPSULE | Freq: Every day | ORAL | Status: DC

## 2018-08-22 MED ORDER — FLUTICASONE FUROATE-VILANTEROL 100-25 MCG/INH IN AEPB: 1.0000 | INHALATION_SPRAY | Freq: Every day | RESPIRATORY_TRACT | 0 refills | Status: DC

## 2018-08-22 MED ORDER — NICOTINE 21 MG/24HR TD PT24: 21.0000 mg | MEDICATED_PATCH | Freq: Every day | TRANSDERMAL | 0 refills | Status: DC

## 2018-08-22 MED ORDER — LOSARTAN POTASSIUM 50 MG PO TABS: 50.0000 mg | ORAL_TABLET | Freq: Every day | ORAL | 11 refills | Status: DC

## 2018-08-22 MED ORDER — PREDNISONE 10 MG PO TABS: 20.0000 mg | ORAL_TABLET | Freq: Every day | ORAL | 0 refills | Status: DC

## 2018-08-22 MED ORDER — UMECLIDINIUM BROMIDE 62.5 MCG/INH IN AEPB: 1.0000 | INHALATION_SPRAY | Freq: Every day | RESPIRATORY_TRACT | 0 refills | Status: DC

## 2018-08-22 MED ORDER — DILTIAZEM HCL ER COATED BEADS 300 MG PO CP24: 300.0000 mg | ORAL_CAPSULE | Freq: Every day | ORAL | 11 refills | Status: DC

## 2018-08-22 MED ORDER — DOXYCYCLINE HYCLATE 100 MG PO TABS: 100.0000 mg | ORAL_TABLET | Freq: Two times a day (BID) | ORAL | 0 refills | Status: DC

## 2018-08-22 NOTE — Discharge Summary (Addendum)
Discharge Summary    Patient ID: James Stokes,  MRN: [id number], DOB/AGE: [birth year] 60 y.o.  Admit date: 08/17/2018 Discharge date: 08/22/2018  Primary Care Provider: [place] Bacon Primary Cardiologist: [name], MD  Discharge Diagnoses    Principal Problem:   Acute on chronic respiratory failure with hypoxia [facility]) Active Problems:   Essential hypertension   Tobacco abuse   COPD with hypoxia ([place])   Acute diastolic CHF (congestive heart failure) (HCC)   Atherosclerotic plaque on cardiac vessels, seen on CT chest   Allergies Allergies  Allergen Reactions  . Other Hives and Itching  . Shrimp [Shellfish Allergy] Itching and Rash    Diagnostic Studies/Procedures    PFTs: 08/22/2018 The FVC, FEV1, FEV1/FVC ratio and FEF25-75% are reduced indicating airway obstruction. The FVC is reduced relative to the SVC indicating air trapping. The slow vital capacity is reduced. The reduced diffusing capacity indicates a severe loss of functional alveolar capillary surface. Conclusions: In view of the severity of the diffusion defect, studies with exercise would be helpful to evaluate the presence of hypoxemia. Pulmonary Function Diagnosis: Severe Obstructive Airways Disease  08/17/18 Echo  Study Conclusions - Left ventricle: The cavity size was normal. Wall thickness was increased in a pattern of mild LVH. Systolic function was normal. The estimated ejection fraction was in the range of 60% to 65%. Wall motion was normal; there were no regional wall motion abnormalities. Doppler parameters are consistent with abnormal left ventricular relaxation (grade 1 diastolic dysfunction). - Aortic valve: Valve area (VTI): 2.89 cm^2. Valve area (Vmean): 2.63 cm^2. - Left atrium: The atrium was mildly dilated. - Right ventricle: The cavity size was mildly dilated. Systolic function was mildly reduced. - Right atrium: The atrium was mildly to moderately  dilated.  08/19/18 no DVT bil per LE venous dopplers  _____________   History of Present Illness     62 y.o. male >80-pack-year history of smoking, hypertension, polycythemia, bilateral digital clubbing was admitted 11/21 with acute on chronic hypoxic and hypercapnic respiratory failure,right heart failure,possible COPD and/or superimposed interstitial lung disease.  Hospital Course     Consultants: CCM   1.  Acute on chronic respiratory failure: He was hypoxic on admission.  He was started on oxygen.  An oxygen test was performed prior to discharge and he will require home oxygen.  This was ordered.  A Pulmonary consult was called.  He is on a steroid taper nebulizers and inhalers per the Pulmonary team.  He will follow-up with them as an outpatient.  Pulmonary function test were performed and showed severe obstructive disease.  Results are above.  2.  Acute diastolic CHF: 2D echocardiogram was performed, results above.  He was felt to have right heart failure and was diuresed.  He diuresed almost 10 L and his respiratory status improved.  His EF was normal, no wall motion abnormalities.  His renal function remained stable with diuresis.  3.  Tobacco use: Multiple providers spoke with him about tobacco cessation.  He was started on a nicotine patch.  It is hoped that he will be able to continue not to smoke as an outpatient.  4.  Polycythemia: His hemoglobin was 20 on admission.  In October, his hemoglobin was 19.6.  This is felt secondary to tobacco use, it should improve with smoking cessation.  5.  Hypertension: His medications were adjusted during his hospital stay.  He was put on Cardizem instead of amlodipine.  This was uptitrated for better blood pressure  control.  He had been on lisinopril, but there was concern for cough so this was changed to losartan.  The losartan was increased for better blood pressure control.  6.  COPD: Labs to rule out interstitial lung disease  such is anti-scleroderma antibodies, Sjogren's syndrome testing, anti-DNA antibody, rheumatoid, a respiratory panel and other tests were performed.  These were all essentially negative.  His medications were adjusted and he was started on inhalers plus nebulizers per the pulmonary team.  He will be discharged on these and follow-up with pulmonary as an outpatient.  7.  Atherosclerotic plaque in cardiac vessels: CT angiogram of the chest was performed and showed atherosclerotic plaque on all cardiac vessels.  He is on aspirin, and ARB and can discuss adding a statin as an outpatient.  No beta-blocker due to his respiratory issues.  He is currently not having any ischemic symptoms.  Once his respiratory status is stabilized, consider ischemic testing.  On 08/22/2018, he was seen by Dr. Marlou Porch and all data were reviewed.  No further inpatient work-up is indicated and he is considered stable for discharge, to follow-up as an outpatient.  _____________  Discharge Vitals Blood pressure (!) 155/94, pulse 100, temperature 98.3 F (36.8 C), temperature source Oral, resp. rate 18, height 5\' 11"  (1.803 m), weight 81.9 kg, SpO2 90 %.  Filed Weights   08/20/18 0536 08/21/18 0516 08/22/18 0336  Weight: 79.5 kg 80.5 kg 81.9 kg    Labs & Radiologic Studies    CBC Lab Results  Component Value Date   WBC 8.5 08/17/2018   HGB 20.0 (H) 08/17/2018   HCT 64.3 (H) 08/17/2018   MCV 97.7 08/17/2018   PLT 200 62/37/6283   Basic Metabolic Panel Recent Labs    08/21/18 0439 08/22/18 0519  NA  --  138  K  --  4.7  CL  --  102  CO2  --  29  GLUCOSE  --  141*  BUN  --  33*  CREATININE  --  1.23  CALCIUM  --  8.8*  MG 2.3 2.2  PHOS 2.9 4.7*   Liver Function Tests Recent Labs    08/22/18 0519  AST 24  ALT 49*  ALKPHOS 76  BILITOT 0.6  PROT 5.5*  ALBUMIN 2.9*    BNP    Component Value Date/Time   BNP 131.5 (H) 08/17/2018 1150    Thyroid Function Tests TSH  Date Value Ref Range Status   08/17/2018 2.502 0.350 - 4.500 uIU/mL Final    Comment:    Performed by a 3rd Generation assay with a functional sensitivity of <=0.01 uIU/mL. Performed at [facility], [place] [address] [place], [place] [address]   01/25/2017 1.59 0.40 - 4.50 mIU/L Final   _____________  Dg Chest 2 View  Result Date: 08/17/2018 CLINICAL DATA:  Shortness of breath, productive cough EXAM: CHEST - 2 VIEW COMPARISON:  07/08/2017 FINDINGS: Mild hyperinflation. Heart is borderline in size. Peribronchial thickening and interstitial prominence, favor bronchitis. No confluent opacities or effusions. IMPRESSION: Mild hyperinflation.  Borderline cardiomegaly.  Bronchitic changes. Electronically Signed   By: Rolm Baptise M.D.   On: 08/17/2018 10:39   Ct Chest High Resolution  Result Date: 08/19/2018 CLINICAL DATA:  62 year old male with history of chronic dyspnea and hypoxemia of unknown cause. Evaluate for interstitial lung disease. EXAM: CT CHEST WITHOUT CONTRAST TECHNIQUE: Multidetector CT imaging of the chest was performed following the standard protocol without intravenous contrast. High resolution imaging of the lungs,  as well as inspiratory and expiratory imaging, was performed. COMPARISON:  No priors. FINDINGS: Cardiovascular: Heart size is normal. There is no significant pericardial fluid, thickening or pericardial calcification. There is aortic atherosclerosis, as well as atherosclerosis of the great vessels of the mediastinum and the coronary arteries, including calcified atherosclerotic plaque in the left main, left anterior descending, left circumflex and right coronary arteries. Mediastinum/Nodes: No pathologically enlarged mediastinal or hilar lymph nodes. Please note that accurate exclusion of hilar adenopathy is limited on noncontrast CT scans. Esophagus is unremarkable in appearance. No axillary lymphadenopathy. Lungs/Pleura: There are some areas of dependent ground-glass attenuation and mild septal  thickening in the lower lobes of the lungs bilaterally which normalize on prone high-resolution imaging, indicative of benign areas of atelectasis. High-resolution imaging otherwise demonstrates no significant regions of ground-glass attenuation, subpleural reticulation, parenchymal banding, traction bronchiectasis or honeycombing. Inspiratory and expiratory imaging is unremarkable. Diffuse bronchial wall thickening with mild centrilobular and paraseptal emphysema. No acute consolidative airspace disease. No pleural effusions. No suspicious appearing pulmonary nodules or masses are noted. Upper Abdomen: Aortic atherosclerosis. Musculoskeletal: There are no aggressive appearing lytic or blastic lesions noted in the visualized portions of the skeleton. IMPRESSION: 1. No evidence of interstitial lung disease. 2. Mild diffuse bronchial wall thickening with mild centrilobular and paraseptal emphysema; imaging findings suggestive of underlying COPD. 3. Aortic atherosclerosis, in addition to left main and 3 vessel coronary artery disease. Please note that although the presence of coronary artery calcium documents the presence of coronary artery disease, the severity of this disease and any potential stenosis cannot be assessed on this non-gated CT examination. Assessment for potential risk factor modification, dietary therapy or pharmacologic therapy may be warranted, if clinically indicated. Aortic Atherosclerosis (ICD10-I70.0) and Emphysema (ICD10-J43.9). Electronically Signed   By: Vinnie Langton M.D.   On: 08/19/2018 14:21   Vas [place] Lower Extremity Venous (dvt)  Result Date: 08/20/2018  Lower Venous Study Indications: SOB.  Comparison Study: No prior study on file Performing Technologist: Sharion Dove RVS  Examination Guidelines: A complete evaluation includes B-mode imaging, spectral Doppler, color Doppler, and power Doppler as needed of all accessible portions of each vessel. Bilateral testing is considered  an integral part of a complete examination. Limited examinations for reoccurring indications may be performed as noted.  Right Venous Findings: +---------+---------------+---------+-----------+----------+-------+          CompressibilityPhasicitySpontaneityPropertiesSummary +---------+---------------+---------+-----------+----------+-------+ CFV      Full           Yes      Yes                          +---------+---------------+---------+-----------+----------+-------+ SFJ      Full                                                 +---------+---------------+---------+-----------+----------+-------+ FV Prox  Full                                                 +---------+---------------+---------+-----------+----------+-------+ FV Mid   Full                                                 +---------+---------------+---------+-----------+----------+-------+  FV DistalFull                                                 +---------+---------------+---------+-----------+----------+-------+ PFV      Full                                                 +---------+---------------+---------+-----------+----------+-------+ POP      Full           Yes      Yes                          +---------+---------------+---------+-----------+----------+-------+ PTV      Full                                                 +---------+---------------+---------+-----------+----------+-------+ PERO     Full                                                 +---------+---------------+---------+-----------+----------+-------+ GSV      Full                                                 +---------+---------------+---------+-----------+----------+-------+  Left Venous Findings: +---------+---------------+---------+-----------+----------+-------+          CompressibilityPhasicitySpontaneityPropertiesSummary  +---------+---------------+---------+-----------+----------+-------+ CFV      Full           Yes      Yes                          +---------+---------------+---------+-----------+----------+-------+ SFJ      Full                                                 +---------+---------------+---------+-----------+----------+-------+ FV Prox  Full                                                 +---------+---------------+---------+-----------+----------+-------+ FV Mid   Full                                                 +---------+---------------+---------+-----------+----------+-------+ FV DistalFull                                                 +---------+---------------+---------+-----------+----------+-------+  PFV      Full                                                 +---------+---------------+---------+-----------+----------+-------+ POP      Full                                                 +---------+---------------+---------+-----------+----------+-------+ PTV      Full                                                 +---------+---------------+---------+-----------+----------+-------+ PERO     Full                                                 +---------+---------------+---------+-----------+----------+-------+ GSV      Full                                                 +---------+---------------+---------+-----------+----------+-------+    Summary: Right: There is no evidence of deep vein thrombosis in the lower extremity.There is no evidence of superficial venous thrombosis. Left: There is no evidence of deep vein thrombosis in the lower extremity.There is no evidence of superficial venous thrombosis.  *See table(s) above for measurements and observations. Electronically signed by Harold Barban MD on 08/20/2018 at 11:54:02 AM.    Final    Disposition   Pt is being discharged home today in good condition.  Follow-up Plans  & Appointments    Follow-up Information    [place] Foy, NP Follow up on 08/28/2018.   Specialty:  Pulmonary Disease Why:  1130am  Contact information: [address] [place] [address] [phone]        Brand Males, MD Follow up on 10/04/2018.   Specialty:  Pulmonary Disease Why:  at 1130 am  Contact information: [address] [place] [address] [phone]        [facility] Follow up.   Why:  will deliver portable concentrator to home and concentrator to your home Contact information: [place] [phone]          Discharge Instructions    Diet - low sodium heart healthy   Complete by:  As directed    Increase activity slowly   Complete by:  As directed       Discharge Medications   Allergies as of 08/22/2018      Reactions   Other Hives, Itching   Shrimp [shellfish Allergy] Itching, Rash      Medication List    STOP taking these medications   amLODipine 10 MG tablet Commonly known as:  NORVASC   lisinopril 20 MG tablet Commonly known as:  PRINIVIL,ZESTRIL     TAKE these medications   albuterol 108 (90 Base) MCG/ACT  inhaler Commonly known as:  PROVENTIL HFA;VENTOLIN HFA Inhale 2 puffs into the lungs every 6 (six) hours as needed for wheezing or shortness of breath (cough, shortness of breath or wheezing.).   aspirin 81 MG tablet Take 81 mg by mouth every morning.   diltiazem 300 MG 24 hr capsule Commonly known as:  CARDIZEM CD Take 1 capsule (300 mg total) by mouth daily. Start taking on:  08/23/2018   doxycycline 100 MG tablet Commonly known as:  VIBRA-TABS Take 1 tablet (100 mg total) by mouth every 12 (twelve) hours.   fluticasone furoate-vilanterol 100-25 MCG/INH Aepb Commonly known as:  BREO ELLIPTA Inhale 1 puff into the lungs daily. Start taking on:  08/23/2018   losartan 50 MG tablet Commonly known as:  COZAAR Take 1 tablet (50 mg total) by mouth  daily. Start taking on:  08/23/2018   MULTIVITAMIN ADULT PO Take 1 tablet by mouth every morning.   nicotine 21 mg/24hr patch Commonly known as:  NICODERM CQ - dosed in mg/24 hours Place 1 patch (21 mg total) onto the skin daily. Start taking on:  08/23/2018   predniSONE 10 MG tablet Commonly known as:  DELTASONE Take 2 tablets (20 mg total) by mouth daily with breakfast. 2 tabs daily for 3 days, 1 tab daily for 3 days and stop. Start taking on:  08/23/2018   umeclidinium bromide 62.5 MCG/INH Aepb Commonly known as:  INCRUSE ELLIPTA Inhale 1 puff into the lungs daily. Start taking on:  08/23/2018            Durable Medical Equipment  (From admission, onward)         Start     Ordered   08/22/18 1500  For home use only DME oxygen  Once    Comments:  Please deliver Lightweight POC, the care freestyle  Question Answer Comment  Mode or (Route) Nasal cannula   Liters per Minute 2   Frequency Continuous (stationary and portable oxygen unit needed)   Oxygen conserving device Yes   Oxygen delivery system Other see comments      08/22/18 1500             Outstanding Labs/Studies   Alpha-1 antitrypsin phenotype  Duration of Discharge Encounter   Greater than 30 minutes including physician time.  Alcario Drought Barrett NP 08/22/2018, 4:43 PM  Personally seen and examined. Agree with above.  Seems as improved from a respiratory standpoint.  He is quite eager to go home. Agree with changing Cardizem, increasing to 300 mg a day and increasing losartan to 50 mg a day.  Lungs sound better.  Decreased breath sounds.  Regular rate and rhythm. Okay with discharge.  We will have close follow-up with pulmonary.  Appreciate pulmonary consultation and their instructions.  Candee Furbish, MD

## 2018-08-22 NOTE — Progress Notes (Addendum)
Progress Note  Patient Name: James Stokes Date of Encounter: 08/22/2018  Primary Cardiologist: [name], MD   Subjective   No chest pain, feels is breathing ok, wants to go home. Willing to use O2.   Inpatient Medications    Scheduled Meds: . aspirin EC  81 mg Oral Daily  . diltiazem  240 mg Oral Daily  . doxycycline  100 mg Oral Q12H  . enoxaparin (LOVENOX) injection  40 mg Subcutaneous Q24H  . fluticasone furoate-vilanterol  1 puff Inhalation Daily  . folic acid  1 mg Oral Daily  . hydrocortisone cream   Topical BID  . losartan  25 mg Oral Daily  . multivitamin with minerals  1 tablet Oral Daily  . nicotine  21 mg Transdermal Daily  . pantoprazole  40 mg Oral Q0600  . predniSONE  20 mg Oral Q breakfast  . thiamine  100 mg Oral Daily  . umeclidinium bromide  1 puff Inhalation Daily   Continuous Infusions:  PRN Meds: acetaminophen, albuterol, ALPRAZolam, nitroGLYCERIN, ondansetron (ZOFRAN) IV, sodium chloride flush   Vital Signs    Vitals:   08/22/18 0336 08/22/18 0736 08/22/18 0930 08/22/18 1144  BP: 137/83  (!) 158/95 (!) 155/94  Pulse: 92  96 100  Resp: 17  20 18   Temp: 97.8 F (36.6 C)  97.7 F (36.5 C) 98.3 F (36.8 C)  TempSrc: Oral  Oral Oral  SpO2: 95% 94% 97% 90%  Weight: 81.9 kg     Height:        Intake/Output Summary (Last 24 hours) at 08/22/2018 1224 Last data filed at 08/22/2018 1044 Gross per 24 hour  Intake 480 ml  Output 2075 ml  Net -1595 ml   Filed Weights   08/20/18 0536 08/21/18 0516 08/22/18 0336  Weight: 79.5 kg 80.5 kg 81.9 kg    Telemetry    SR, ST, no sig ectopy - Personally Reviewed  ECG    No new - Personally Reviewed  Physical Exam   General: Well developed, well nourished, male in no acute distress Head: Eyes PERRLA, No xanthomas.   Normocephalic and atraumatic Lungs: decreased BS bilaterally w/ some dry rales. Heart: HRRR S1 S2, without MRG.  Pulses are 2+ & equal. No JVD. Abdomen: Bowel  sounds are present, abdomen soft and non-tender without masses or  hernias noted. Msk: Normal strength and tone for age. Extremities: No clubbing, cyanosis or edema.    Skin:  No rashes or lesions noted. Neuro: Alert and oriented X 3. Psych:  Good affect, responds appropriately   Labs    Chemistry Recent Labs  Lab 08/18/18 0445 08/19/18 0544 08/22/18 0519  NA 139 137 138  K 4.8 3.6 4.7  CL 103 98 102  CO2 27 29 29   GLUCOSE 139* 85 141*  BUN 16 21* 33*  CREATININE 1.25* 1.22 1.23  CALCIUM 8.6* 8.3* 8.8*  PROT  --   --  5.5*  ALBUMIN  --   --  2.9*  AST  --   --  24  ALT  --   --  49*  ALKPHOS  --   --  76  BILITOT  --   --  0.6  GFRNONAA >60 >60 >60  GFRAA >60 >60 >60  ANIONGAP 9 10 7      Hematology Recent Labs  Lab 08/17/18 1150  WBC 8.5  RBC 6.58*  HGB 20.0*  HCT 64.3*  MCV 97.7  MCH 30.4  MCHC 31.1  RDW  15.8*  PLT 200    Cardiac EnzymesNo results for input(s): TROPONINI in the last 168 hours.  Recent Labs  Lab 08/17/18 1023  TROPIPOC 0.01     BNP Recent Labs  Lab 08/17/18 1150  BNP 131.5*     Radiology    No results found.  Cardiac Studies   PFTs: 08/22/2018 The FVC, FEV1, FEV1/FVC ratio and FEF25-75% are reduced indicating airway obstruction. The FVC is reduced relative to the SVC indicating air trapping. The slow vital capacity is reduced. The reduced diffusing capacity indicates a severe loss of functional alveolar capillary surface. Conclusions: In view of the severity of the diffusion defect, studies with exercise would be helpful to evaluate the presence of hypoxemia. Pulmonary Function Diagnosis: Severe Obstructive Airways Disease  08/17/18 Echo  Study Conclusions - Left ventricle: The cavity size was normal. Wall thickness was   increased in a pattern of mild LVH. Systolic function was normal.   The estimated ejection fraction was in the range of 60% to 65%.   Wall motion was normal; there were no regional wall motion    abnormalities. Doppler parameters are consistent with abnormal   left ventricular relaxation (grade 1 diastolic dysfunction). - Aortic valve: Valve area (VTI): 2.89 cm^2. Valve area (Vmean):   2.63 cm^2. - Left atrium: The atrium was mildly dilated. - Right ventricle: The cavity size was mildly dilated. Systolic   function was mildly reduced. - Right atrium: The atrium was mildly to moderately dilated.  08/19/18 no DVT bil. On venous dopplers   Patient Profile     62 y.o. male >80-pack-year history of smoking, hypertension, polycythemia, bilateral digital clubbing presents with acute on chronic hypoxic and hypercapnic respiratory failure, right heart failure, undergoing work-up for COPD and possible superimposed interstitial lung disease.  Assessment & Plan    Acute on chronic respiratory failure  - Pulm managing, COPD w/ hypoxia on admit - steroids, nebs, O2 per Pulm - PFT results above, severe obstructive dz - need clarification from Pulm MD on what meds he is to go home on.  Tobacco use  - cessation encouraged - Nicotine patch  Polycythemia-- - Hgb 20 on admit - recheck as outpt  Acute diastolic HF  - I/O net neg 9.6 L since admit  - however, no sig change in wt - echo results above, EF nl - Cr stable but BUN elevated today, recheck in am or as outpt - not currently on diuretic  CAD by CTA of chest  -- Atherosclerotic plaque seen in multiple vessels on CT - f/u as outpt, may need MV  OSA  - mgt per CCM   ETOH  - no DTs - cessation encouraged  HTN -  - amlodipine 10 mg >>Cardizem CD 240 mg - lisinopril 20 mg >>losartan 25 mg qd - BP still elevated, HR up as well - will increase Cardizem to 300 mg qd - can increase Losartan as well, if needed  Plan: ambulate, possible d/c if resp issues clarified and pt does well w/ ambulation  For questions or updates, please contact [facility] Please consult [email or web address] for contact info under        Signed, Rosaria Ferries, PA-C  08/22/2018, 12:24 PM    Personally seen and examined. Agree with above.  Seems as improved from a respiratory standpoint.  He is quite eager to go home. Agree with changing Cardizem, increasing to 300 mg a day and increasing losartan to 50 mg a day.  Lungs sound better.  Decreased breath sounds.  Regular rate and rhythm. Okay with discharge.  We will have close follow-up with pulmonary.  Appreciate pulmonary consultation and their instructions.  Candee Furbish, MD

## 2018-08-22 NOTE — Progress Notes (Signed)
SATURATION QUALIFICATIONS: (This note is used to comply with regulatory documentation for home oxygen)  Patient Saturations on Room Air at Rest = 93%  Patient Saturations on Room Air while Ambulating = 86%  Patient Saturations on 2 Liters of oxygen while Ambulating = 92%  Patient oxygen dropped while pt was ambulating. Pt need oxygen at home.

## 2018-08-22 NOTE — Care Management Note (Signed)
Case Management Note  Patient Details  Name: James Stokes MRN: [id number] Date of Birth: [birth year]  Subjective/Objective:    Acute resp failure, COPD, CHF                 Action/Plan: NCM spoke to pt and wife at home to assist with care. Pt will need oxygen for home. Contacted [facility] for oxygen for home. Will deliver to room. Waiting MD signature on order.   Expected Discharge Date:                 Expected Discharge Plan:  Home/Self Care  In-House Referral:  NA  Discharge planning Services  CM Consult  Post Acute Care Choice:  NA Choice offered to:  NA  DME Arranged:  Oxygen DME Agency:  [facility]  HH Arranged:  NA HH Agency:  NA  Status of Service:  Completed, signed off  If discussed at [place] of Stay Meetings, dates discussed:    Additional Comments:  Erenest Rasher, RN 08/22/2018, 2:49 PM

## 2018-08-22 NOTE — Progress Notes (Addendum)
NAME:  James Stokes, MRN:  [id number], DOB:  19-Sep-1956, LOS: 5 ADMISSION DATE:  08/17/2018, CONSULTATION DATE:  11/22 REFERRING MD:  Gwenlyn Found, CHIEF COMPLAINT:  Hypoxic respiratory failure    Brief History   62 yo male presented with cough, dyspnea, fatigue, hypersomnolence, hypoxia, hypercapnia.  Past Medical History  COPD, Polycythemia, ETOH, HLD, Nephrolithiasis  Significant Hospital Events   11/21 admitted for evaluation of hypoxia   Consults:  11/23>> Pulmonary   Procedures:  11/26 PFT's See Below  Significant Diagnostic Tests:  ECHO 11/21 >> LVEF 60-65%, mild LVH, doppler parameters c/w gd 1 diastolic dysfxn, LA mildy dilated, RA mildly dilated, RV mildly dilated w/ reduced RV fxn HRCT chest 11/23 >> atelectasis, diffuse bronchial wall thickening, mild centrilobular/paraseptal emphysema PFT's 11/26>>  The FVC, FEV1, FEV1/FVC ratio and FEF25-75% are reduced indicating airway obstruction. The FVC is reduced relative to the SVC indicating air trapping. The slow vital capacity is reduced.  The reduced  ( 43%) diffusing capacity indicates a severe loss of functional alveolar capillary surface. Conclusions: In view of the severity of the diffusion defect, studies with exercise would be helpful to evaluate the presence of hypoxemia. Pulmonary Function Diagnosis: Severe Obstructive Airways Disease Severe decreased DLCO  Micro Data:  RSV 11/23 >> negative  Antimicrobials:  Doxycycline 11/23 >>   Interim history/subjective:  Not having as much cough or sputum.  Denies chest pain. Very anxious to go home. Currently wearing oxygen at 2 L [place] with saturation of 90% Hospital Follow up appointment 08/28/2018 with Lazaro Arms NP at 11:30 am   Objective   Blood pressure (!) 155/94, pulse 100, temperature 98.3 F (36.8 C), temperature source Oral, resp. rate 18, height 5\' 11"  (1.803 m), weight 81.9 kg, SpO2 90 %.        Intake/Output Summary (Last 24 hours) at 08/22/2018  1311 Last data filed at 08/22/2018 1044 Gross per 24 hour  Intake 480 ml  Output 2075 ml  Net -1595 ml   Filed Weights   08/20/18 0536 08/21/18 0516 08/22/18 0336  Weight: 79.5 kg 80.5 kg 81.9 kg    Examination:  General - alert, wearing oxygen at 2 L, ambulating in room Eyes - pupils equal and reactive ENT - no sinus tenderness, no stridor, no LAD Cardiac - S1, S2,  regular rate/rhythm, no murmur, rub or gallop Chest - Bilateral chest excursion. decreased BS throughout, few wheezes Abdomen - soft, non tender, ND, + bowel sounds Extremities - no cyanosis, clubbing, or edema Skin - no rashes, lesions, warm and dry Neuro - CN intact, normal strength, moves extremities x 4, follows commands, appropriate Psych - normal mood and behavior ( Baseline is brusk)     Assessment & Plan:   Acute on chronic hypoxic and hypercarbic respiratory failure Plan - oxygen to keep SpO2 90 to 95% - will need ambulatory saturation walk to determine oxygen need at rest and with exertion - Home with oxygen ( Consult to case management)  COPD Plan - Discharge home on  incruse and breo inhalers with prn albuterol inhaler: Breo 1 puff once daily,  Incruse 1 puff once daily,  Rinse mouth after use Albuterol rescue inhaler up to 4 times daily as needed  for breakthrough shortness of breath or wheezing - home on  prednisone taper - day 5 /7 of doxycycline>> continue at home to  complete 7 days of therapy  - Aggressive Pulmonary Toilet - Ambulation/ PT/ OT - PFT's confirm severe obstruction and reduced DLCO  -  Will need OP follow up within 1 week of discharge>> scheduled 12/2 at 11:30  Polycythemia >> likely from chronic hypoxia. Plan - Trend CBC  Sleep disordered breathing. Plan  - will need further sleep assessment as outpt - At OP follow up>> consider in lab sleep study  Acute diastolic CHF. Echo EF 07-37%, grade 1 diastolic failure LA with mild dilation RV mildly dilated RA Mild to  moderate dilation Plan  - per cardiology - Consider Exercise Testing  Smoking Cessation Will need to stop smoking Reviewed risks of smoking while wearing oxygen  to include flash burns and combustion of oxygen tank and death Plan: Send home with  nicotine patches Follow up with pulmonary as OP  Pt. Is anxious to go home.  OK to discharge from pulmonary perspective with above recommendations Pulmonary Hosptal Follow up scheduled 12/2 Will need teaching regarding inhalers prior to discharge Will need oxygen at home prior to dc I have spent 3 minutes counseling patient on smoking cessation this visit. Patient verbalizes understanding of his  choice to continue smoking and the negative health consequences including worsening of COPD, risk of lung cancer , stroke and heart disease.Marland Kitchen  He states he wants  to quit.    Labs    CBC Latest Ref Rng & Units 08/17/2018 07/08/2017 01/25/2017  WBC 4.0 - 10.5 K/uL 8.5 7.4 8.7  Hemoglobin 13.0 - 17.0 g/dL 20.0(H) 19.6(H) 18.7(H)  Hematocrit 39.0 - 52.0 % 64.3(H) 55.1(H) 53.3(H)  Platelets 150 - 400 K/uL 200 186 198    CMP Latest Ref Rng & Units 08/22/2018 08/19/2018 08/18/2018  Glucose 70 - 99 mg/dL 141(H) 85 139(H)  BUN 6 - 20 mg/dL 33(H) 21(H) 16  Creatinine 0.61 - 1.24 mg/dL 1.23 1.22 1.25(H)  Sodium 135 - 145 mmol/L 138 137 139  Potassium 3.5 - 5.1 mmol/L 4.7 3.6 4.8  Chloride 98 - 111 mmol/L 102 98 103  CO2 22 - 32 mmol/L 29 29 27   Calcium 8.9 - 10.3 mg/dL 8.8(L) 8.3(L) 8.6(L)  Total Protein 6.5 - 8.1 g/dL 5.5(L) - -  Total Bilirubin 0.3 - 1.2 mg/dL 0.6 - -  Alkaline Phos 38 - 126 U/L 76 - -  AST 15 - 41 U/L 24 - -  ALT 0 - 44 U/L 49(H) - -   ABG    Component Value Date/Time   PHART 7.328 (L) 08/17/2018 1414   PCO2ART 61.1 (H) 08/17/2018 1414   PO2ART 48.0 (L) 08/17/2018 1414   HCO3 32.1 (H) 08/17/2018 1414   TCO2 34 (H) 08/17/2018 1414   O2SAT 79.0 08/17/2018 [place], AGACNP-BC [place] [facility] 08/22/2018, 1:11 PM

## 2018-08-22 NOTE — Plan of Care (Signed)
  Problem: Clinical Measurements: Goal: Ability to maintain clinical measurements within normal limits will improve Outcome: Completed/Met   Problem: Clinical Measurements: Goal: Diagnostic test results will improve Outcome: Completed/Met   Problem: Clinical Measurements: Goal: Respiratory complications will improve Outcome: Completed/Met   Problem: Activity: Goal: Risk for activity intolerance will decrease Outcome: Completed/Met

## 2018-08-22 NOTE — Discharge Instructions (Signed)
Weigh daily at the same time of day, record.  Call the cardiology office for weight gain of 3 pounds in a day or 5 pounds in a week, it is probably fluid.

## 2018-08-28 ENCOUNTER — Telehealth: Payer: Self-pay | Admitting: *Deleted

## 2018-08-28 ENCOUNTER — Ambulatory Visit: Payer: 59 | Admitting: Nurse Practitioner

## 2018-08-28 ENCOUNTER — Encounter: Payer: Self-pay | Admitting: Nurse Practitioner

## 2018-08-28 VITALS — BP 138/82 | HR 92 | Ht 71.0 in | Wt 175.8 lb

## 2018-08-28 DIAGNOSIS — D582 Other hemoglobinopathies: Secondary | ICD-10-CM

## 2018-08-28 DIAGNOSIS — D751 Secondary polycythemia: Secondary | ICD-10-CM

## 2018-08-28 DIAGNOSIS — J449 Chronic obstructive pulmonary disease, unspecified: Secondary | ICD-10-CM

## 2018-08-28 DIAGNOSIS — Z720 Tobacco use: Secondary | ICD-10-CM

## 2018-08-28 DIAGNOSIS — R0683 Snoring: Secondary | ICD-10-CM | POA: Diagnosis not present

## 2018-08-28 DIAGNOSIS — J9621 Acute and chronic respiratory failure with hypoxia: Secondary | ICD-10-CM | POA: Diagnosis not present

## 2018-08-28 DIAGNOSIS — R0902 Hypoxemia: Secondary | ICD-10-CM

## 2018-08-28 LAB — CBC WITH DIFFERENTIAL/PLATELET
BASOS ABS: 0 10*3/uL (ref 0.0–0.1)
Basophils Relative: 0.3 % (ref 0.0–3.0)
EOS ABS: 0.1 10*3/uL (ref 0.0–0.7)
EOS PCT: 0.8 % (ref 0.0–5.0)
HCT: 64.7 % (ref 39.0–52.0)
Hemoglobin: 21 g/dL (ref 13.0–17.0)
Lymphocytes Relative: 9.3 % — ABNORMAL LOW (ref 12.0–46.0)
Lymphs Abs: 1 10*3/uL (ref 0.7–4.0)
MCHC: 32.4 g/dL (ref 30.0–36.0)
MCV: 92.5 fl (ref 78.0–100.0)
MONO ABS: 0.5 10*3/uL (ref 0.1–1.0)
Monocytes Relative: 4.9 % (ref 3.0–12.0)
Neutro Abs: 8.8 10*3/uL — ABNORMAL HIGH (ref 1.4–7.7)
Neutrophils Relative %: 84.7 % — ABNORMAL HIGH (ref 43.0–77.0)
Platelets: 199 10*3/uL (ref 150.0–400.0)
RBC: 7 Mil/uL — AB (ref 4.22–5.81)
RDW: 15.3 % (ref 11.5–15.5)
WBC: 10.4 10*3/uL (ref 4.0–10.5)

## 2018-08-28 LAB — ALPHA-1 ANTITRYPSIN PHENOTYPE: A1 ANTITRYPSIN SER: 158 mg/dL (ref 101–187)

## 2018-08-28 LAB — BASIC METABOLIC PANEL
BUN: 40 mg/dL — AB (ref 6–23)
CO2: 30 mEq/L (ref 19–32)
Calcium: 9.7 mg/dL (ref 8.4–10.5)
Chloride: 101 mEq/L (ref 96–112)
Creatinine, Ser: 1.21 mg/dL (ref 0.40–1.50)
GFR: 64.95 mL/min (ref >60.00)
Glucose, Bld: 116 mg/dL — ABNORMAL HIGH (ref 70–99)
POTASSIUM: 4.5 meq/L (ref 3.5–5.1)
Sodium: 139 mEq/L (ref 135–145)

## 2018-08-28 NOTE — Telephone Encounter (Signed)
Order for "phlebotomy therapeutic" placed. Confirmed patient will have to go to [facility] location or to the hospital in order to have this performed.

## 2018-08-28 NOTE — Telephone Encounter (Signed)
Please order phlebotomy to draw 2 units - due to elevated HGB (critical) make sure he has a followed up in next 2 weeks with Dr. Chase Caller for follow up.

## 2018-08-28 NOTE — Progress Notes (Signed)
@Patient  ID: James Stokes, male    DOB: 1956/04/22, 62 y.o.   MRN: [id number]  Chief Complaint  Patient presents with  . Hospitalization Follow-up    Referring provider: Levin Bacon, NP   HPI  62 year old male former smoker >80 pack year history (quit at hospital discharge) with COPD, acute on chronic respiratory failure with hypoxia (2L [place] continuous), CHF, and HTN followed by Dr. Chase Caller.  Tests: CT chest 08/19/18 - No evidence of interstitial lung disease. Mild diffuse bronchial wall thickening with mild centrilobular and paraseptal emphysema; imaging findings suggestive of underlying COPD. Aortic atherosclerosis.  Chest x ray 08/17/18 - Mild hyperinflation.  Borderline cardiomegaly.  Bronchitic changes.   PFT: PFT Results Latest Ref Rng & Units 08/19/2018  FVC-Pre L 2.58  FVC-Predicted Pre % 52  Pre FEV1/FVC % % 68  FEV1-Pre L 1.77  FEV1-Predicted Pre % 47  DLCO UNC% % 43  DLCO COR %Predicted % 65    OV 08/28/18 - hospital follow up - admission 08/17/18 - 08/22/18 Patient was admitted to the hospital on 08/17/18 for complaints of increasing shortness of breath, increasing cough, and increasing fatigue over the past month. Noticed he was falling asleep frequently during the day. This was new for him. He also had lower extremity edema and symptoms of reflux. His sats were noted to be in the 70's. He was found to have acute on chronic hypoxic and hypercapnic respiratory failure,right heart failure,COPD and/or superimposed interstitial lung disease. He was seen by pulmonary who started a trial of incruse and breo after PFT showed moderate to severe obstruction and diffusing effect. He was started on 2L [place] continuously. It was noted by Dr. Halford Chessman that he would need pulmonary rehab arranged and home sleep study ordered at this visit. Patient states that overall he has been doing well since discharge. He feels the oxygen and inhalers have helped significantly. His wife  states that he does snore heavily at night. Epworth score today is 13. He denies any shortness of breath, fever, chest pain, or edema.     Results of the Epworth flowsheet 08/28/2018  Sitting and reading 3  Watching TV 2  Sitting, inactive in a public place (e.g. a theatre or a meeting) 1  As a passenger in a car for an hour without a break 1  Lying down to rest in the afternoon when circumstances permit 2  Sitting and talking to someone 2  Sitting quietly after a lunch without alcohol 2  In a car, while stopped for a few minutes in traffic 0  Total score 13        Allergies  Allergen Reactions  . Other Hives and Itching  . Shrimp [Shellfish Allergy] Itching and Rash     There is no immunization history on file for this patient.  Past Medical History:  Diagnosis Date  . Acute diastolic CHF (congestive heart failure) ([place]) 08/22/2018  . Atherosclerotic plaque on cardiac vessels, seen on CT chest 08/22/2018  . Dyslipidemia   . ETOH abuse   . HTN (hypertension)   . Hypertriglyceridemia   . Kidney stones   . Smoker     Tobacco History: Social History   Tobacco Use  Smoking Status Current Every Day Smoker  . Packs/day: 2.00  . Years: 43.00  . Pack years: 86.00  . Types: Cigarettes  Smokeless Tobacco Former Systems developer  . Types: Chew   Ready to quit: Yes Counseling given: Yes   Outpatient Encounter Medications  as of 08/28/2018  Medication Sig  . albuterol (PROVENTIL HFA;VENTOLIN HFA) 108 (90 Base) MCG/ACT inhaler Inhale 2 puffs into the lungs every 6 (six) hours as needed for wheezing or shortness of breath (cough, shortness of breath or wheezing.).  Marland Kitchen aspirin 81 MG tablet Take 81 mg by mouth every morning.   . diltiazem (CARDIZEM CD) 300 MG 24 hr capsule Take 1 capsule (300 mg total) by mouth daily.  . fluticasone furoate-vilanterol (BREO ELLIPTA) 100-25 MCG/INH AEPB Inhale 1 puff into the lungs daily.  Marland Kitchen losartan (COZAAR) 50 MG tablet Take 1 tablet (50 mg total) by  mouth daily.  . Multiple Vitamins-Minerals (MULTIVITAMIN ADULT PO) Take 1 tablet by mouth every morning.   . nicotine (NICODERM CQ - DOSED IN MG/24 HOURS) 21 mg/24hr patch Place 1 patch (21 mg total) onto the skin daily.  . predniSONE (DELTASONE) 10 MG tablet Take 2 tablets (20 mg total) by mouth daily with breakfast. 2 tabs daily for 3 days, 1 tab daily for 3 days and stop.  Marland Kitchen umeclidinium bromide (INCRUSE ELLIPTA) 62.5 MCG/INH AEPB Inhale 1 puff into the lungs daily.  . [DISCONTINUED] doxycycline (VIBRA-TABS) 100 MG tablet Take 1 tablet (100 mg total) by mouth every 12 (twelve) hours. (Patient not taking: Reported on 08/28/2018)   No facility-administered encounter medications on file as of 08/28/2018.      Review of Systems  Review of Systems  Constitutional: Negative.  Negative for chills and fever.  HENT: Negative.   Respiratory: Negative for cough, chest tightness, shortness of breath and wheezing.   Cardiovascular: Negative.  Negative for chest pain, palpitations and leg swelling.  Gastrointestinal: Negative.   Allergic/Immunologic: Negative.   Neurological: Negative.   Psychiatric/Behavioral: Negative.        Physical Exam  BP 138/82 (BP Location: Left Arm, Patient Position: Sitting, Cuff Size: Normal)   Pulse 92   Ht 5\' 11"  (1.803 m)   Wt 175 lb 12.8 oz (79.7 kg)   SpO2 94% Comment: 2L of O2 pulse  BMI 24.52 kg/m   Wt Readings from Last 5 Encounters:  08/28/18 175 lb 12.8 oz (79.7 kg)  08/22/18 180 lb 8 oz (81.9 kg)  08/17/18 189 lb 12.8 oz (86.1 kg)  04/27/18 181 lb 1.6 oz (82.1 kg)  04/19/18 179 lb (81.2 kg)     Physical Exam  Constitutional: He is oriented to person, place, and time. He appears well-developed and well-nourished. No distress.  Cardiovascular: Normal rate and regular rhythm.  Pulmonary/Chest: Effort normal and breath sounds normal. No respiratory distress. He has no wheezes. He exhibits no tenderness.  O2 2L   Neurological: He is alert and  oriented to person, place, and time.  Skin: Skin is warm and dry.  Psychiatric: He has a normal mood and affect.  Nursing note and vitals reviewed.    Imaging: Dg Chest 2 View  Result Date: 08/17/2018 CLINICAL DATA:  Shortness of breath, productive cough EXAM: CHEST - 2 VIEW COMPARISON:  07/08/2017 FINDINGS: Mild hyperinflation. Heart is borderline in size. Peribronchial thickening and interstitial prominence, favor bronchitis. No confluent opacities or effusions. IMPRESSION: Mild hyperinflation.  Borderline cardiomegaly.  Bronchitic changes. Electronically Signed   By: Rolm Baptise M.D.   On: 08/17/2018 10:39   Ct Chest High Resolution  Result Date: 08/19/2018 CLINICAL DATA:  62 year old male with history of chronic dyspnea and hypoxemia of unknown cause. Evaluate for interstitial lung disease. EXAM: CT CHEST WITHOUT CONTRAST TECHNIQUE: Multidetector CT imaging of the chest was performed following  the standard protocol without intravenous contrast. High resolution imaging of the lungs, as well as inspiratory and expiratory imaging, was performed. COMPARISON:  No priors. FINDINGS: Cardiovascular: Heart size is normal. There is no significant pericardial fluid, thickening or pericardial calcification. There is aortic atherosclerosis, as well as atherosclerosis of the great vessels of the mediastinum and the coronary arteries, including calcified atherosclerotic plaque in the left main, left anterior descending, left circumflex and right coronary arteries. Mediastinum/Nodes: No pathologically enlarged mediastinal or hilar lymph nodes. Please note that accurate exclusion of hilar adenopathy is limited on noncontrast CT scans. Esophagus is unremarkable in appearance. No axillary lymphadenopathy. Lungs/Pleura: There are some areas of dependent ground-glass attenuation and mild septal thickening in the lower lobes of the lungs bilaterally which normalize on prone high-resolution imaging, indicative of  benign areas of atelectasis. High-resolution imaging otherwise demonstrates no significant regions of ground-glass attenuation, subpleural reticulation, parenchymal banding, traction bronchiectasis or honeycombing. Inspiratory and expiratory imaging is unremarkable. Diffuse bronchial wall thickening with mild centrilobular and paraseptal emphysema. No acute consolidative airspace disease. No pleural effusions. No suspicious appearing pulmonary nodules or masses are noted. Upper Abdomen: Aortic atherosclerosis. Musculoskeletal: There are no aggressive appearing lytic or blastic lesions noted in the visualized portions of the skeleton. IMPRESSION: 1. No evidence of interstitial lung disease. 2. Mild diffuse bronchial wall thickening with mild centrilobular and paraseptal emphysema; imaging findings suggestive of underlying COPD. 3. Aortic atherosclerosis, in addition to left main and 3 vessel coronary artery disease. Please note that although the presence of coronary artery calcium documents the presence of coronary artery disease, the severity of this disease and any potential stenosis cannot be assessed on this non-gated CT examination. Assessment for potential risk factor modification, dietary therapy or pharmacologic therapy may be warranted, if clinically indicated. Aortic Atherosclerosis (ICD10-I70.0) and Emphysema (ICD10-J43.9). Electronically Signed   By: Vinnie Langton M.D.   On: 08/19/2018 14:21     Assessment & Plan:   Tobacco abuse Congratulations on quitting smoking  Polycythemia Hemoglobin was 20 on admission to the hospital. This was felt secondary to tobacco use. Will recheck levels today and call with results.   Acute on chronic respiratory failure with hypoxia (HCC) Continue O2 at 2 L [place]   COPD with hypoxia (HCC) Stable - patient is feeling much improved  Patient Instructions  Congratulations on quitting smoking!! Continue Breo and incruse Will order labs today and call with  results Will order pulmonary rehab Will order HST per Dr. Juanetta Gosling note in hospital/ wife reported snoring Continue current medications Continue O2 at 2L [place] Please keep follow up appointments with cardiology and PCP Follow up with Dr. Halford Chessman after HST Follow up with Dr. Chase Caller in 4 weeks or sooner if needed      Snoring Will order HST and call with results     Fenton Foy, NP 08/29/2018

## 2018-08-28 NOTE — Telephone Encounter (Signed)
Called the patient and made him aware of the information below and that per conversation with Kenney Houseman this would be due to his having gone so long without treatment for his COPD. I told the patient the order was placed and he can go to our [place] location at a time this week that will work for him. Patient stated that he would think about, but may contact the red cross to see if he can donate, because if he is going to have to get blood drawn it might as well go to a good cause. I advised the patient to call our office when he decides what to do so we can make note of it.

## 2018-08-28 NOTE — Patient Instructions (Addendum)
Congratulations on quitting smoking!! Continue Breo and incruse Will order labs today and call with results Will order pulmonary rehab Will order HST per Dr. Juanetta Gosling note in hospital/ wife reported snoring Continue current medications Continue O2 at 2L [place] Please keep follow up appointments with cardiology and PCP Follow up with Dr. Halford Chessman after HST Follow up with Dr. Chase Caller in 4 weeks or sooner if needed

## 2018-08-28 NOTE — Telephone Encounter (Signed)
Lab called and let me know patient has a critical lab value. CRITICAL VALUE STICKER  CRITICAL VALUE: HgB 21, Hct 64.7  RECEIVER (on-site recipient of call): Ashley Akin RN   DATE & TIME NOTIFIED: 08/28/2018 1420  MESSENGER (representative from lab): Shari Prows.  MD NOTIFIED: Lazaro Arms NP-verbal  TIME OF NOTIFICATION: 2761  RESPONSE: Routing to [place] to address

## 2018-08-29 ENCOUNTER — Encounter: Payer: Self-pay | Admitting: Nurse Practitioner

## 2018-08-29 DIAGNOSIS — G4730 Sleep apnea, unspecified: Secondary | ICD-10-CM

## 2018-08-29 DIAGNOSIS — D751 Secondary polycythemia: Secondary | ICD-10-CM | POA: Insufficient documentation

## 2018-08-29 DIAGNOSIS — R0683 Snoring: Secondary | ICD-10-CM | POA: Insufficient documentation

## 2018-08-29 HISTORY — DX: Sleep apnea, unspecified: G47.30

## 2018-08-29 NOTE — Assessment & Plan Note (Signed)
Stable - patient is feeling much improved  Patient Instructions  Congratulations on quitting smoking!! Continue Breo and incruse Will order labs today and call with results Will order pulmonary rehab Will order HST per Dr. Juanetta Gosling note in hospital/ wife reported snoring Continue current medications Continue O2 at 2L [place] Please keep follow up appointments with cardiology and PCP Follow up with Dr. Halford Chessman after HST Follow up with Dr. Chase Caller in 4 weeks or sooner if needed

## 2018-08-29 NOTE — Assessment & Plan Note (Signed)
Will order HST and call with results

## 2018-08-29 NOTE — Assessment & Plan Note (Signed)
Continue O2 at 2 L [place] 

## 2018-08-29 NOTE — Assessment & Plan Note (Signed)
Hemoglobin was 20 on admission to the hospital. This was felt secondary to tobacco use. Will recheck levels today and call with results.

## 2018-08-29 NOTE — Assessment & Plan Note (Signed)
Congratulations on quitting smoking!! 

## 2018-08-31 ENCOUNTER — Other Ambulatory Visit (INDEPENDENT_AMBULATORY_CARE_PROVIDER_SITE_OTHER): Payer: 59

## 2018-08-31 ENCOUNTER — Telehealth: Payer: Self-pay | Admitting: Internal Medicine

## 2018-08-31 ENCOUNTER — Encounter: Payer: Self-pay | Admitting: General Surgery

## 2018-08-31 ENCOUNTER — Telehealth: Payer: Self-pay

## 2018-08-31 DIAGNOSIS — D582 Other hemoglobinopathies: Secondary | ICD-10-CM | POA: Diagnosis not present

## 2018-08-31 DIAGNOSIS — D751 Secondary polycythemia: Secondary | ICD-10-CM

## 2018-08-31 LAB — CBC WITH DIFFERENTIAL/PLATELET
BASOS ABS: 0.1 10*3/uL (ref 0.0–0.1)
Basophils Relative: 0.5 % (ref 0.0–3.0)
EOS PCT: 2.7 % (ref 0.0–5.0)
Eosinophils Absolute: 0.3 10*3/uL (ref 0.0–0.7)
Hemoglobin: 20.2 g/dL (ref 13.0–17.0)
LYMPHS PCT: 15 % (ref 12.0–46.0)
Lymphs Abs: 1.4 10*3/uL (ref 0.7–4.0)
MCHC: 33.5 g/dL (ref 30.0–36.0)
MCV: 91.5 fl (ref 78.0–100.0)
Monocytes Absolute: 0.6 10*3/uL (ref 0.1–1.0)
Monocytes Relative: 6 % (ref 3.0–12.0)
NEUTROS ABS: 7.2 10*3/uL (ref 1.4–7.7)
Neutrophils Relative %: 75.8 % (ref 43.0–77.0)
PLATELETS: 177 10*3/uL (ref 150.0–400.0)
RBC: 6.59 Mil/uL — ABNORMAL HIGH (ref 4.22–5.81)
RDW: 15.2 % (ref 11.5–15.5)
WBC: 9.5 10*3/uL (ref 4.0–10.5)

## 2018-08-31 NOTE — Progress Notes (Signed)
I was approached by clinic supervisor Alethia Berthold RN and CMA about phlebotomy decision [therapeutic] for this patient.  Apparently there is an order for 2 units of phlebotomy.  Patient went to phlebotomy center on [facility] and they can only do 1 unit at a time.  I reviewed the documentation from hematology in August 2019 where they thought the polycythemia was secondary and did not think phlebotomy would help.  But most recently patient was seen by nurse practitioner who apparently discussed with Dr. Melvyn Novas (do not see documentation] according to the CMA and 2 units of phlebotomy was approved.  Currently nurse practitioner Lazaro Arms is not available.  Therefore based on review of the chart I have approved 1 unit of phlebotomy at the current in the phlebotomy center.  Further phlebotomy decisions can be made as an outpatient during follow-up when the patient returns to see me [had seen the patient once in the hospital]

## 2018-08-31 NOTE — Telephone Encounter (Signed)
Call Report from Santiago Glad at Lab  Hgb 20.2 Hematocrit 60.3  MR please advise. Thanks.

## 2018-08-31 NOTE — Telephone Encounter (Signed)
1 unit was done today.  Please see earlier telephone notes.  Will route to Dr. Chase Caller

## 2018-08-31 NOTE — Telephone Encounter (Signed)
Therapeutic phlebotomy ordered to [facility] office.  Pt aware.  Nothing further needed.

## 2018-08-31 NOTE — Telephone Encounter (Signed)
Received call from [facility].  She just wanted MR to know that know that they do not do more then one unit, because they do not have the means to monitor the Patient.

## 2018-08-31 NOTE — Telephone Encounter (Signed)
Patient is calling back stating he would like to go [place] location and is not having this done through [employer].  Please call him and advise if order is in so that they can do this when he gets there.  CB is [phone].

## 2018-08-31 NOTE — Telephone Encounter (Signed)
This iss old and chronic ans well known. There was a plan to do 1 unit phlebotomy today. Still doing it?

## 2018-08-31 NOTE — Telephone Encounter (Signed)
Received call from [facility].  Order was placed by Kenney Houseman, NP.  Jerene Pitch stated that they needed a CBC.  Jerene Pitch stated that she had placed a order for CBC under Dr. Chase Caller.  Nothing further at this time.

## 2018-08-31 NOTE — Telephone Encounter (Signed)
I dw Daneil Dan and ok to just do 1 unit phleboltomy alone today

## 2018-08-31 NOTE — Telephone Encounter (Signed)
Thanks. Will close note

## 2018-08-31 NOTE — Telephone Encounter (Signed)
See earlier notes.

## 2018-08-31 NOTE — Telephone Encounter (Addendum)
See previous note from Lazaro Arms, NP order.   Will rout to MR and Tonya, as [employer]

## 2018-09-05 NOTE — Progress Notes (Signed)
Reviewed and agree with assessment/plan.   Zabria Liss, MD [place] [facility] 09/22/2016, 12:24 PM Pager:  [phone]  

## 2018-09-06 ENCOUNTER — Ambulatory Visit: Payer: 59 | Admitting: Cardiology

## 2018-09-13 ENCOUNTER — Telehealth: Payer: Self-pay | Admitting: Internal Medicine

## 2018-09-13 DIAGNOSIS — J9611 Chronic respiratory failure with hypoxia: Secondary | ICD-10-CM

## 2018-09-13 NOTE — Addendum Note (Signed)
Addended by: Nena Polio on: 09/13/2018 04:21 PM   Modules accepted: Orders

## 2018-09-13 NOTE — Telephone Encounter (Signed)
Spoke with Carlette at [place] re[facility], order was placed for pulm rehab under both copd and acute respiratory failure.  Only one dx is needed, and an acute dx cannot be used.  New referral placed with correct dx code.  Nothing further needed.

## 2018-09-14 ENCOUNTER — Telehealth (HOSPITAL_COMMUNITY): Payer: Self-pay

## 2018-09-14 NOTE — Telephone Encounter (Signed)
Attempted to call patient in regards to  PR - LM on VM 

## 2018-09-14 NOTE — Telephone Encounter (Signed)
Pt insurance is active and benefits verified through [facility]. Co-pay $25.00, DED $0.00/$0.00 met, out of pocket $6,500.00/$50.00 met, co-insurance 0%. No pre-authorization required. Darrell/[employer], 09/14/18 @ 1:11PM, REF# [id number]

## 2018-09-17 ENCOUNTER — Other Ambulatory Visit (HOSPITAL_COMMUNITY): Payer: Self-pay | Admitting: Physician Assistant

## 2018-09-18 ENCOUNTER — Other Ambulatory Visit (HOSPITAL_COMMUNITY): Payer: Self-pay | Admitting: Physician Assistant

## 2018-09-18 ENCOUNTER — Other Ambulatory Visit: Payer: Self-pay | Admitting: Cardiovascular Disease

## 2018-09-18 MED ORDER — NICOTINE 21 MG/24HR TD PT24: 21.0000 mg | MEDICATED_PATCH | Freq: Every day | TRANSDERMAL | 0 refills | Status: DC

## 2018-09-18 NOTE — Telephone Encounter (Signed)
 *  STAT* If patient is at the pharmacy, call can be transferred to refill team.   1. Which medications need to be refilled? (please list name of each medication and dose if known) nicotine (NICODERM CQ - DOSED IN MG/24 HOURS) 21 mg/24hr patch  2. Which pharmacy/location (including street and city if local pharmacy) is medication to be sent to? [facility]  3. Do they need a 30 day or 90 day supply? 30  Only has 1 patch left

## 2018-09-18 NOTE — Telephone Encounter (Signed)
This is Dr. Berry's pt 

## 2018-09-28 ENCOUNTER — Telehealth: Payer: Self-pay

## 2018-09-28 ENCOUNTER — Encounter

## 2018-09-28 ENCOUNTER — Ambulatory Visit: Payer: 59 | Admitting: Cardiology

## 2018-09-28 ENCOUNTER — Telehealth: Payer: Self-pay | Admitting: Internal Medicine

## 2018-09-28 ENCOUNTER — Encounter: Payer: Self-pay | Admitting: Cardiology

## 2018-09-28 DIAGNOSIS — J9621 Acute and chronic respiratory failure with hypoxia: Secondary | ICD-10-CM | POA: Diagnosis not present

## 2018-09-28 DIAGNOSIS — I7090 Unspecified atherosclerosis: Secondary | ICD-10-CM | POA: Diagnosis not present

## 2018-09-28 DIAGNOSIS — J449 Chronic obstructive pulmonary disease, unspecified: Secondary | ICD-10-CM | POA: Diagnosis not present

## 2018-09-28 DIAGNOSIS — D751 Secondary polycythemia: Secondary | ICD-10-CM

## 2018-09-28 DIAGNOSIS — R0902 Hypoxemia: Secondary | ICD-10-CM

## 2018-09-28 DIAGNOSIS — I10 Essential (primary) hypertension: Secondary | ICD-10-CM | POA: Diagnosis not present

## 2018-09-28 NOTE — Assessment & Plan Note (Signed)
Admitted from the office 08/17/18- improved after 10L diuresis (Rt heart failure) and pulmonary Rx

## 2018-09-28 NOTE — Assessment & Plan Note (Signed)
Controlled, mild LVH on echo

## 2018-09-28 NOTE — Telephone Encounter (Signed)
Faxed oxygen forms to Dr Golden Pop office.

## 2018-09-28 NOTE — Assessment & Plan Note (Signed)
Recent phlebotomy one unit- pulmonary handling this

## 2018-09-28 NOTE — Telephone Encounter (Signed)
Contacted Dr Golden Pop office and left message for his nurse to give me a call.

## 2018-09-28 NOTE — Assessment & Plan Note (Signed)
Incidental finding- no history of angina

## 2018-09-28 NOTE — Telephone Encounter (Signed)
Attempted to contact Tee at [facility]. I was on hold for 5+ mins and was told that I was caller #[phone] in que. Will try back.

## 2018-09-28 NOTE — Assessment & Plan Note (Signed)
Now on chronic O2 

## 2018-09-28 NOTE — Progress Notes (Signed)
09/28/2018 [name]   07/29/1956  [id number]  Primary Physician Levin Bacon, NP Primary Cardiologist: Dr Gwenlyn Found  HPI:  Mr. Baize is a pleasant 63 year old male followed by Dr. Gwenlyn Found.  He has a history of COPD, dyslipidemia, 2 pack a day smoking, and daily alcohol use- 3-5 beers a day.  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 In 2019 Dr. Gwenlyn Found referred the patient to Dr. Alen Blew for hematology evaluation.  Patient's hemoglobin has been high.  Dr. Alen Blew feels this is most likely secondary to COPD.  The patient was seen in the office 08/17/18 with weakness and hypoxia.  He was admitted to [facility].  He was felt to have acute on chronic respiratory failure and Rt heart failure.  He diuresed 10L.  He was seen by the pulmonary service and placed on inhalers and steroids.   He was discharged 08/22/18 on home O2.   He is in the office today for follow up.  He has kept his f/u with pulmonary.  He did have 1 unit phlebotomy done.  He has stopped smoking.  He feels much more alert and is able to do more around the house.  His wife does think there are some memory issues but overall she feels he is much improved.    Current Outpatient Medications  Medication Sig Dispense Refill  . albuterol (PROVENTIL HFA;VENTOLIN HFA) 108 (90 Base) MCG/ACT inhaler Inhale 2 puffs into the lungs every 6 (six) hours as needed for wheezing or shortness of breath (cough, shortness of breath or wheezing.). 1 Inhaler 1  . aspirin 81 MG tablet Take 81 mg by mouth every morning.     . diltiazem (CARDIZEM CD) 300 MG 24 hr capsule Take 1 capsule (300 mg total) by mouth daily. 30 capsule 11  . fluticasone furoate-vilanterol (BREO ELLIPTA) 100-25 MCG/INH AEPB Inhale 1 puff into the lungs daily. 1 each 0  . losartan (COZAAR) 50 MG tablet Take 1 tablet (50 mg total) by mouth daily. 30 tablet 11  . Multiple Vitamins-Minerals (MULTIVITAMIN ADULT PO) Take 1 tablet by mouth every morning.     . nicotine (NICODERM CQ - DOSED  IN MG/24 HOURS) 21 mg/24hr patch Place 1 patch (21 mg total) onto the skin daily. 28 patch 0  . umeclidinium bromide (INCRUSE ELLIPTA) 62.5 MCG/INH AEPB Inhale 1 puff into the lungs daily. 1 each 0   No current facility-administered medications for this visit.     Allergies  Allergen Reactions  . Other Hives and Itching  . Shrimp [Shellfish Allergy] Itching and Rash    Past Medical History:  Diagnosis Date  . Acute diastolic CHF (congestive heart failure) ([place]) 08/22/2018  . Atherosclerotic plaque on cardiac vessels, seen on CT chest 08/22/2018  . Dyslipidemia   . ETOH abuse   . HTN (hypertension)   . Hypertriglyceridemia   . Kidney stones   . Smoker     Social History   Socioeconomic History  . Marital status: Married    Spouse name: Not on file  . Number of children: Not on file  . Years of education: Not on file  . Highest education level: Not on file  Occupational History  . Not on file  Social Needs  . Financial resource strain: Not on file  . Food insecurity:    Worry: Not on file    Inability: Not on file  . Transportation needs:    Medical: Not on file    Non-medical: Not on file  Tobacco Use  . Smoking status: Former Smoker    Packs/day: 2.00    Years: 43.00    Pack years: 86.00    Types: Cigarettes    Last attempt to quit: 08/17/2018    Years since quitting: 0.1  . Smokeless tobacco: Never Used  Substance and Sexual Activity  . Alcohol use: Yes    Alcohol/week: 15.0 standard drinks    Types: 15 Cans of beer per week    Comment: "6-10 beers a night"  . Drug use: Yes    Types: Marijuana  . Sexual activity: Not on file  Lifestyle  . Physical activity:    Days per week: Not on file    Minutes per session: Not on file  . Stress: Not on file  Relationships  . Social connections:    Talks on phone: Not on file    Gets together: Not on file    Attends religious service: Not on file    Active member of club or organization: Not on file    Attends  meetings of clubs or organizations: Not on file    Relationship status: Not on file  . Intimate partner violence:    Fear of current or ex partner: Not on file    Emotionally abused: Not on file    Physically abused: Not on file    Forced sexual activity: Not on file  Other Topics Concern  . Not on file  Social History Narrative  . Not on file     Family History  Problem Relation Age of Onset  . COPD Mother   . Vascular Disease Father 15       aneurysm  . Hyperlipidemia Father   . Hypertension Father   . Cancer Father   . Diabetes Paternal Aunt   . Diabetes Paternal Uncle      Review of Systems: General: negative for chills, fever, night sweats or weight changes.  Cardiovascular: negative for chest pain, dyspnea on exertion, edema, orthopnea, palpitations, paroxysmal nocturnal dyspnea or shortness of breath Dermatological: negative for rash Respiratory: negative for cough or wheezing Urologic: negative for hematuria Abdominal: negative for nausea, vomiting, diarrhea, bright red blood per rectum, melena, or hematemesis Neurologic: negative for visual changes, syncope, or dizziness All other systems reviewed and are otherwise negative except as noted above.    Blood pressure 136/78, pulse 80, height 5\' 11"  (1.803 m), weight 183 lb 3.2 oz (83.1 kg), SpO2 92 %.  General appearance: alert, cooperative, appears older than stated age, no distress and on O2 Lungs: decreased breath sounds, no wheezing Heart: regular rate and rhythm Extremities: no edema Skin: pink, warm and dry Neurologic: Grossly normal  Echo 08/17/18- Study Conclusions  - Left ventricle: The cavity size was normal. Wall thickness was   increased in a pattern of mild LVH. Systolic function was normal.   The estimated ejection fraction was in the range of 60% to 65%.   Wall motion was normal; there were no regional wall motion   abnormalities. Doppler parameters are consistent with abnormal   left  ventricular relaxation (grade 1 diastolic dysfunction). - Aortic valve: Valve area (VTI): 2.89 cm^2. Valve area (Vmean):   2.63 cm^2. - Left atrium: The atrium was mildly dilated. - Right ventricle: The cavity size was mildly dilated. Systolic   function was mildly reduced. - Right atrium: The atrium was mildly to moderately dilated.  ASSESSMENT AND PLAN:   Acute on chronic respiratory failure with hypoxia ([place]) Admitted from the office  08/17/18- improved after 10L diuresis (Rt heart failure) and pulmonary Rx  Atherosclerotic plaque on cardiac vessels, seen on CT chest Incidental finding- no history of angina  COPD with hypoxia (HCC) Now on chronic O2  Essential hypertension Controlled, mild LVH on echo  Polycythemia Recent phlebotomy one unit- pulmonary handling this   PLAN  Mr Martinezlopez looks markedly improved from when I admitted him 08/17/18. He is not smoking. I encouraged him to watch his salt intake.  I'll see him back in 3 months and arrange for repeat fasting lipids, he'll most likley need statin Rx. If he is doing well he can f/u with Dr Gwenlyn Found 6 months after that.   Kerin Ransom PA-C 09/28/2018 10:11 AM

## 2018-09-28 NOTE — Patient Instructions (Signed)
Medication Instructions:  Your physician recommends that you continue on your current medications as directed. Please refer to the Current Medication list given to you today.  If you need a refill on your cardiac medications before your next appointment, please call your pharmacy.   Lab work: NONE  If you have labs (blood work) drawn today and your tests are completely normal, you will receive your results only by: Marland Kitchen MyChart Message (if you have MyChart) OR . A paper copy in the mail If you have any lab test that is abnormal or we need to change your treatment, we will call you to review the results.  Testing/Procedures: NONE  Follow-Up: At [facility], you and your health needs are our priority.  As part of our continuing mission to provide you with exceptional heart care, we have created designated Provider Care Teams.  These Care Teams include your primary Cardiologist (physician) and Advanced Practice Providers (APPs -  Physician Assistants and Nurse Practitioners) who all work together to provide you with the care you need, when you need it.  Your physician recommends that you schedule a follow-up appointment in: 3 months with Kerin Ransom, PA-C  Any Other Special Instructions Will Be Listed Below (If Applicable).    Low-Sodium Eating Plan Sodium, which is an element that makes up salt, helps you maintain a healthy balance of fluids in your body. Too much sodium can increase your blood pressure and cause fluid and waste to be held in your body. Your health care provider or dietitian may recommend following this plan if you have high blood pressure (hypertension), kidney disease, liver disease, or heart failure. Eating less sodium can help lower your blood pressure, reduce swelling, and protect your heart, liver, and kidneys. What are tips for following this plan? General guidelines  Most people on this plan should limit their sodium intake to 1,500-2,000 mg (milligrams) of  sodium each day. Reading food labels   The Nutrition Facts label lists the amount of sodium in one serving of the food. If you eat more than one serving, you must multiply the listed amount of sodium by the number of servings.  Choose foods with less than 140 mg of sodium per serving.  Avoid foods with 300 mg of sodium or more per serving. Shopping  Look for lower-sodium products, often labeled as "low-sodium" or "no salt added."  Always check the sodium content even if foods are labeled as "unsalted" or "no salt added".  Buy fresh foods. ? Avoid canned foods and premade or frozen meals. ? Avoid canned, cured, or processed meats  Buy breads that have less than 80 mg of sodium per slice. Cooking  Eat more home-cooked food and less restaurant, buffet, and fast food.  Avoid adding salt when cooking. Use salt-free seasonings or herbs instead of table salt or sea salt. Check with your health care provider or pharmacist before using salt substitutes.  Cook with plant-based oils, such as canola, sunflower, or olive oil. Meal planning  When eating at a restaurant, ask that your food be prepared with less salt or no salt, if possible.  Avoid foods that contain MSG (monosodium glutamate). MSG is sometimes added to [place] food, bouillon, and some canned foods. What foods are recommended? The items listed may not be a complete list. Talk with your dietitian about what dietary choices are best for you. Grains Low-sodium cereals, including oats, puffed wheat and rice, and shredded wheat. Low-sodium crackers. Unsalted rice. Unsalted pasta. Low-sodium bread. Whole-grain  breads and whole-grain pasta. Vegetables Fresh or frozen vegetables. "No salt added" canned vegetables. "No salt added" tomato sauce and paste. Low-sodium or reduced-sodium tomato and vegetable juice. Fruits Fresh, frozen, or canned fruit. Fruit juice. Meats and other protein foods Fresh or frozen (no salt added) meat,  poultry, seafood, and fish. Low-sodium canned tuna and salmon. Unsalted nuts. Dried peas, beans, and lentils without added salt. Unsalted canned beans. Eggs. Unsalted nut butters. Dairy Milk. Soy milk. Cheese that is naturally low in sodium, such as ricotta cheese, fresh mozzarella, or Swiss cheese Low-sodium or reduced-sodium cheese. Cream cheese. Yogurt. Fats and oils Unsalted butter. Unsalted margarine with no trans fat. Vegetable oils such as canola or olive oils. Seasonings and other foods Fresh and dried herbs and spices. Salt-free seasonings. Low-sodium mustard and ketchup. Sodium-free salad dressing. Sodium-free light mayonnaise. Fresh or refrigerated horseradish. Lemon juice. Vinegar. Homemade, reduced-sodium, or low-sodium soups. Unsalted popcorn and pretzels. Low-salt or salt-free chips. What foods are not recommended? The items listed may not be a complete list. Talk with your dietitian about what dietary choices are best for you. Grains Instant hot cereals. Bread stuffing, pancake, and biscuit mixes. Croutons. Seasoned rice or pasta mixes. Noodle soup cups. Boxed or frozen macaroni and cheese. Regular salted crackers. Self-rising flour. Vegetables Sauerkraut, pickled vegetables, and relishes. Olives. [place] fries. Onion rings. Regular canned vegetables (not low-sodium or reduced-sodium). Regular canned tomato sauce and paste (not low-sodium or reduced-sodium). Regular tomato and vegetable juice (not low-sodium or reduced-sodium). Frozen vegetables in sauces. Meats and other protein foods Meat or fish that is salted, canned, smoked, spiced, or pickled. Bacon, ham, sausage, hotdogs, corned beef, chipped beef, packaged lunch meats, salt pork, jerky, pickled herring, anchovies, regular canned tuna, sardines, salted nuts. Dairy Processed cheese and cheese spreads. Cheese curds. Blue cheese. Feta cheese. String cheese. Regular cottage cheese. Buttermilk. Canned milk. Fats and oils Salted  butter. Regular margarine. Ghee. Bacon fat. Seasonings and other foods Onion salt, garlic salt, seasoned salt, table salt, and sea salt. Canned and packaged gravies. Worcestershire sauce. Tartar sauce. Barbecue sauce. Teriyaki sauce. Soy sauce, including reduced-sodium. Steak sauce. Fish sauce. Oyster sauce. Cocktail sauce. Horseradish that you find on the shelf. Regular ketchup and mustard. Meat flavorings and tenderizers. Bouillon cubes. Hot sauce and Tabasco sauce. Premade or packaged marinades. Premade or packaged taco seasonings. Relishes. Regular salad dressings. Salsa. Potato and tortilla chips. Corn chips and puffs. Salted popcorn and pretzels. Canned or dried soups. Pizza. Frozen entrees and pot pies. Summary  Eating less sodium can help lower your blood pressure, reduce swelling, and protect your heart, liver, and kidneys.  Most people on this plan should limit their sodium intake to 1,500-2,000 mg (milligrams) of sodium each day.  Canned, boxed, and frozen foods are high in sodium. Restaurant foods, fast foods, and pizza are also very high in sodium. You also get sodium by adding salt to food.  Try to cook at home, eat more fresh fruits and vegetables, and eat less fast food, canned, processed, or prepared foods. This information is not intended to replace advice given to you by your health care provider. Make sure you discuss any questions you have with your health care provider. Document Released: 03/05/2002 Document Revised: 09/06/2016 Document Reviewed: 09/06/2016 Elsevier Interactive Patient Education  2019 [employer].

## 2018-09-29 ENCOUNTER — Telehealth: Payer: Self-pay | Admitting: Cardiovascular Disease

## 2018-09-29 ENCOUNTER — Telehealth: Payer: Self-pay | Admitting: Internal Medicine

## 2018-09-29 MED ORDER — UMECLIDINIUM BROMIDE 62.5 MCG/INH IN AEPB: 1.0000 | INHALATION_SPRAY | Freq: Every day | RESPIRATORY_TRACT | 5 refills | Status: DC

## 2018-09-29 MED ORDER — FLUTICASONE FUROATE-VILANTEROL 100-25 MCG/INH IN AEPB: 1.0000 | INHALATION_SPRAY | Freq: Every day | RESPIRATORY_TRACT | 5 refills | Status: DC

## 2018-09-29 NOTE — Telephone Encounter (Signed)
New Message   Return Lisa's call to in Triage regarding paperwork

## 2018-09-29 NOTE — Telephone Encounter (Addendum)
Pt has not been seen by MR yet for an OV at the office. Pt does have an appt scheduled with MR 10/04/18. Pt has seen Lazaro Arms, NP at office for a hosp f/u visit.   At pt's OV with Marcell Barlow stated: Patient Instructions  Congratulations on quitting smoking!! Continue Breo and incruse Will order labs today and call with results Will order pulmonary rehab Will order HST per Dr. Juanetta Gosling note in hospital/ wife reported snoring Continue current medications Continue O2 at 2L [place] Please keep follow up appointments with cardiology and PCP Follow up with Dr. Halford Chessman after HST Follow up with Dr. Chase Caller in 4 weeks or sooner if needed  I have sent a refill of both the breo and incruse to pt's preferred pharmacy.  Called pt to let him know this had been done. Pt expressed understanding. Nothing further needed.

## 2018-09-29 NOTE — Telephone Encounter (Signed)
Returned call to [facility] @ [employer]. [email or web address].

## 2018-09-29 NOTE — Telephone Encounter (Signed)
Called and spoke with [name], [name]  James Stokes is off today.  Marissa took a message to give James Stokes to return call Monday.  Raquel Sarna, CMA with MR, stated that she has the fax that was sent from [facility] in her Urgent folder for MR to sign next week, when he returns to the office.

## 2018-09-29 NOTE — Telephone Encounter (Signed)
Lattie Haw, [facility], returning call. Cb is [phone]

## 2018-10-03 NOTE — Telephone Encounter (Signed)
Call received from [place] @ Dr.Ramaswamy's office [facility]. They have received the request from Jana Half to take over the orders for the pt's oxygen. The pt will be seen in their office tomorrow and they will take over responsibility for the pt oxygen. Mady Haagensen I will send and update to [facility] and [place], [place].

## 2018-10-03 NOTE — Telephone Encounter (Signed)
Thank you! I will discard the paperwork that I have and let Luke/ Rhonda.

## 2018-10-03 NOTE — Telephone Encounter (Signed)
Spoke with Lattie Haw with [facility]. She is aware that the pt has an appointment with [place] tomorrow and that we would take over his oxygen needs. Nothing further was needed at this time.

## 2018-10-04 ENCOUNTER — Encounter: Payer: Self-pay | Admitting: Internal Medicine

## 2018-10-04 ENCOUNTER — Ambulatory Visit: Payer: 59 | Admitting: Internal Medicine

## 2018-10-04 VITALS — BP 130/74 | HR 98 | Ht 71.0 in | Wt 186.0 lb

## 2018-10-04 DIAGNOSIS — J9611 Chronic respiratory failure with hypoxia: Secondary | ICD-10-CM | POA: Diagnosis not present

## 2018-10-04 DIAGNOSIS — D751 Secondary polycythemia: Secondary | ICD-10-CM

## 2018-10-04 DIAGNOSIS — Z87891 Personal history of nicotine dependence: Secondary | ICD-10-CM

## 2018-10-04 DIAGNOSIS — J449 Chronic obstructive pulmonary disease, unspecified: Secondary | ICD-10-CM

## 2018-10-04 DIAGNOSIS — R413 Other amnesia: Secondary | ICD-10-CM

## 2018-10-04 LAB — CBC WITH DIFFERENTIAL/PLATELET
BASOS ABS: 0.1 10*3/uL (ref 0.0–0.1)
BASOS PCT: 0.7 % (ref 0.0–3.0)
EOS ABS: 0.3 10*3/uL (ref 0.0–0.7)
Eosinophils Relative: 3.5 % (ref 0.0–5.0)
HCT: 46.6 % (ref 39.0–52.0)
Hemoglobin: 16.3 g/dL (ref 13.0–17.0)
LYMPHS ABS: 2.5 10*3/uL (ref 0.7–4.0)
Lymphocytes Relative: 26.8 % (ref 12.0–46.0)
MCHC: 34.9 g/dL (ref 30.0–36.0)
MCV: 85.9 fl (ref 78.0–100.0)
MONO ABS: 0.9 10*3/uL (ref 0.1–1.0)
Monocytes Relative: 9.7 % (ref 3.0–12.0)
NEUTROS ABS: 5.6 10*3/uL (ref 1.4–7.7)
NEUTROS PCT: 59.3 % (ref 43.0–77.0)
PLATELETS: 182 10*3/uL (ref 150.0–400.0)
RBC: 5.43 Mil/uL (ref 4.22–5.81)
RDW: 14.6 % (ref 11.5–15.5)
WBC: 9.4 10*3/uL (ref 4.0–10.5)

## 2018-10-04 NOTE — Addendum Note (Signed)
Addended by: Suzzanne Cloud E on: 10/04/2018 12:33 PM   Modules accepted: Orders

## 2018-10-04 NOTE — Patient Instructions (Addendum)
Chronic respiratory failure with hypoxia (HCC) COPD, severe (HCC)  -Clinically you have improved -Glad inhalers are working well for you -Continue Incruse and Breo as scheduled with albuterol as needed -Recommend flu shot and pneumonia shot but respect your refusal -Monitor pulse ox at home and okay to not use oxygen when it is greater than 88%  -Walk test today on room air your pulse ox lowest on room air was 94% -At some point we can talk about pulmonary rehabilitation to improve your symptoms and physical conditioning -   Polycythemia -Last 1 unit phlebotomy was August 31, 2018 -Recheck CBC with differential today January 2020 -If hemoglobin is still elevated I will contact the hematologist  Quit smoking  -Glad he quit smoking  Memory issues -Agree with your plan to monitor this but if it persists or does not improve or gets worse we will consider referral to neurologist  Follow-up -In 3 months do spirometry pre-and postbronchodilator [no need for DLCO lung volumes] -Return to regular clinic in 3 months,: CAT score  at follow-up

## 2018-10-04 NOTE — Progress Notes (Signed)
OV 10/04/2018  Subjective:  Patient ID: James Stokes, male , DOB: 02-Mar-1956 , age 63 y.o. , MRN: [id number] , ADDRESS: [place] [id number]   10/04/2018 -   Chief Complaint  Patient presents with  . Follow-up    Pt states he has been doing well since last OV. Denies any complaints of SOB or CP. States he coughs occ but it is not a major concern. Pt is on 2L almost 24/7.     HPI ALFARD James Stokes 63 y.o. -presents for follow-up of his COPD diagnosed while in the hospital.  Post hospital follow-up he saw nurse practitioner.  Since then he is on triple inhaler therapy with Incruse and Breo and 24/7 oxygen.  He presents with his wife.  Both of them report that overall he is doing well.  Minimal symptoms.  Inhalers help really helped him.  COPD CAT score is only 5.5.  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  The machines that can make the oxygen tank explode or the power of that can react the oxygen cords and disable him significantly in a very rapid amount of time.  Reviewed his immunization record and does need a flu shot and pneumonia vaccine but he has refused.  We walked him to 150 feet x 3 laps in the office: Resting pulse ox was 97%.  Final pulse ox 94%.  Heart rate went from 100/min at rest to 112/min at rest.  He had no complaints.  Walk with a normal pace.  He tells me that he is now able to take off oxygen while driving or at the restaurant and when he monitors his pulse ox it is in the high 80s occasionally but most of the time it is in the low 90s.  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  It was felt to be secondary and the thought process was that phlebotomies would not help him.  In December 2019 he saw a nurse  practitioner who recommended and he had 1 unit of phlebotomy.  Follow-up hemoglobin is not available but will be done today  New issue memory loss: Wife is reporting since admission occasional intermittent episodes of short-term memory loss and very rare long-term memory loss.  At this point in time the do not want neurology evaluation and want to observe.     CAT COPD Symptom & Quality of Life Score (GSK trademark) 0 is no burden. 5 is highest burden 10/04/2018   Never Cough -> Cough all the time 1  No phlegm in chest -> Chest is full of phlegm 0  No chest tightness -> Chest feels very tight 0.5  No dyspnea for 1 flight stairs/hill -> Very dyspneic for 1 flight of stairs 2  No limitations for ADL at home -> Very limited with ADL at home 0  Confident leaving home -> Not at all confident leaving home 0  Sleep soundly -> Do not sleep soundly because of lung condition 0  Lots of Energy -> No energy at all 2  TOTAL Score (max 40)  5.5      ROS - per HPI     has a past medical history of Acute diastolic  CHF (congestive heart failure) ([place]) (08/22/2018), Atherosclerotic plaque on cardiac vessels, seen on CT chest (08/22/2018), Dyslipidemia, ETOH abuse, HTN (hypertension), Hypertriglyceridemia, Kidney stones, and Smoker.   reports that he quit smoking about 6 weeks ago. His smoking use included cigarettes. He has a 86.00 pack-year smoking history. He has never used smokeless tobacco.  Past Surgical History:  Procedure Laterality Date  . HERNIA REPAIR     pt was 55 month old  . HERNIA REPAIR     double hernia pt unsure of exact date, DR. Ballen did surgery  . lipoma  05/1989   fatty tumor on back    Allergies  Allergen Reactions  . Other Hives and Itching  . Shrimp [Shellfish Allergy] Itching and Rash     There is no immunization history on file for this patient.  Family History  Problem Relation Age of Onset  . COPD Mother   . Vascular Disease Father 77       aneurysm  .  Hyperlipidemia Father   . Hypertension Father   . Cancer Father   . Diabetes Paternal Aunt   . Diabetes Paternal Uncle      Current Outpatient Medications:  .  albuterol (PROVENTIL HFA;VENTOLIN HFA) 108 (90 Base) MCG/ACT inhaler, Inhale 2 puffs into the lungs every 6 (six) hours as needed for wheezing or shortness of breath (cough, shortness of breath or wheezing.)., Disp: 1 Inhaler, Rfl: 1 .  aspirin 81 MG tablet, Take 81 mg by mouth every morning. , Disp: , Rfl:  .  diltiazem (CARDIZEM CD) 300 MG 24 hr capsule, Take 1 capsule (300 mg total) by mouth daily., Disp: 30 capsule, Rfl: 11 .  fluticasone furoate-vilanterol (BREO ELLIPTA) 100-25 MCG/INH AEPB, Inhale 1 puff into the lungs daily., Disp: 30 each, Rfl: 5 .  losartan (COZAAR) 50 MG tablet, Take 1 tablet (50 mg total) by mouth daily., Disp: 30 tablet, Rfl: 11 .  Multiple Vitamins-Minerals (MULTIVITAMIN ADULT PO), Take 1 tablet by mouth every morning. , Disp: , Rfl:  .  nicotine (NICODERM CQ - DOSED IN MG/24 HOURS) 21 mg/24hr patch, Place 1 patch (21 mg total) onto the skin daily., Disp: 28 patch, Rfl: 0 .  umeclidinium bromide (INCRUSE ELLIPTA) 62.5 MCG/INH AEPB, Inhale 1 puff into the lungs daily., Disp: 30 each, Rfl: 5      Objective:   Vitals:   10/04/18 1131  BP: 130/74  Pulse: 98  SpO2: 94%  Weight: 186 lb (84.4 kg)  Height: 5\' 11"  (1.803 m)    Estimated body mass index is 25.94 kg/m as calculated from the following:   Height as of this encounter: 5\' 11"  (1.803 m).   Weight as of this encounter: 186 lb (84.4 kg).  @WEIGHTCHANGE @  [employer]   10/04/18 1131  Weight: 186 lb (84.4 kg)     Physical Exam  General Appearance:    Alert, cooperative, no distress, appears stated age - older , Deconditioned looking - yes  , OBESE  - n, Sitting on Wheelchair -  no  Head:    Normocephalic, without obvious abnormality, atraumatic  Eyes:    PERRL, conjunctiva/corneas clear,  Ears:    Normal TM's and external ear canals,  both ears  Nose:   Nares normal, septum midline, mucosa normal, no drainage    or sinus tenderness. OXYGEN ON  - yes . Patient is @ 2l [place]   Throat:   Lips, mucosa, and tongue normal; teeth and gums normal. Cyanosis on lips - no  Neck:   Supple, symmetrical, trachea midline, no adenopathy;    thyroid:  no enlargement/tenderness/nodules; no carotid   bruit or JVD  Back:     Symmetric, no curvature, ROM normal, no CVA tenderness  Lungs:     Distress - no , Wheeze no, Barrell Chest - yes, Purse lip breathing - no, Crackles - yes at base   Chest Wall:    No tenderness or deformity.    Heart:    Regular rate and rhythm, S1 and S2 normal, no rub   or gallop, Murmur - no  Breast Exam:    NOT DONE  Abdomen:     Soft, non-tender, bowel sounds active all four quadrants,    no masses, no organomegaly. Visceral obesity - no  Genitalia:   NOT DONE  Rectal:   NOT DONE  Extremities:   Extremities - normal, Has Cane - no, Clubbing - no, Edema - no  Pulses:   2+ and symmetric all extremities  Skin:   Stigmata of Connective Tissue Disease - no  Lymph nodes:   Cervical, supraclavicular, and axillary nodes normal  Psychiatric:  Neurologic:   Pleasant - yes, Anxious - no, Flat affect - no  CAm-ICU - neg, Alert and Oriented x 3 - yes, Moves all 4s - yes, Speech - normal, Cognition - intact           Assessment:       ICD-10-CM   1. Chronic respiratory failure with hypoxia (HCC) J96.11   2. COPD, severe ([facility] [place]) J44.9   3. Polycythemia D75.1   4. Quit smoking Z87.891   5. Intermittent memory loss R41.3        Plan:     Patient Instructions  Chronic respiratory failure with hypoxia (HCC) COPD, severe ([place])  -Clinically you have improved -Glad inhalers are working well for you -Continue Incruse and Breo as scheduled with albuterol as needed -Recommend flu shot and pneumonia shot but respect your refusal -Monitor pulse ox at home and okay to not use oxygen when it is greater than 88%  -Walk  test today on room air your pulse ox lowest on room air was 94% -At some point we can talk about pulmonary rehabilitation to improve your symptoms and physical conditioning -   Polycythemia -Last 1 unit phlebotomy was August 31, 2018 -Recheck CBC with differential today January 2020 -If hemoglobin is still elevated I will contact the hematologist  Quit smoking  -Glad he quit smoking  Memory issues -Agree with your plan to monitor this but if it persists or does not improve or gets worse we will consider referral to neurologist  Follow-up -In 3 months do spirometry pre-and postbronchodilator [no need for DLCO lung volumes] -Return to regular clinic in 3 months,: CAT score  at follow-up        SIGNATURE    Dr. Brand Males, M.D., F.C.C.P,  Pulmonary and Critical Care Medicine Staff Physician, [place] Director - Interstitial Lung Disease  Program  [facility] [place], [place], [address]  Pager: [phone], If no answer or between  15:00h - 7:00h: call [phone] Telephone: [phone]  12:23 PM 10/04/2018

## 2018-10-11 ENCOUNTER — Telehealth (HOSPITAL_COMMUNITY): Payer: Self-pay

## 2018-10-11 ENCOUNTER — Encounter (HOSPITAL_COMMUNITY): Payer: Self-pay

## 2018-10-11 NOTE — Telephone Encounter (Signed)
Attempted to call pt a 2nd time- LM ON VM ° °Mailed letter out °

## 2018-10-12 ENCOUNTER — Other Ambulatory Visit: Payer: Self-pay | Admitting: Cardiovascular Disease

## 2018-10-17 ENCOUNTER — Other Ambulatory Visit: Payer: Self-pay | Admitting: Cardiovascular Disease

## 2018-10-17 NOTE — Telephone Encounter (Signed)
Follow up    *STAT* If patient is at the pharmacy, call can be transferred to refill team.   1. Which medications need to be refilled? (please list name of each medication and dose if known) Nicotine  2. Which pharmacy/location (including street and city if local pharmacy) is medication to be sent to? [facility] [address]   3. Do they need a 30 day or 90 day supply? 30   Pt will be out tomorrow

## 2018-10-17 NOTE — Telephone Encounter (Signed)
Ok to refill 

## 2018-10-17 NOTE — Telephone Encounter (Signed)
Rx request sent to pharmacy.  

## 2018-10-17 NOTE — Telephone Encounter (Signed)
° ° ° °  Patient calling to request status

## 2018-10-26 NOTE — Telephone Encounter (Signed)
Attempted to call pt a 3rd time- LM ON VM

## 2018-10-31 ENCOUNTER — Encounter (HOSPITAL_BASED_OUTPATIENT_CLINIC_OR_DEPARTMENT_OTHER): Payer: 59

## 2018-11-10 ENCOUNTER — Other Ambulatory Visit: Payer: Self-pay | Admitting: Cardiovascular Disease

## 2018-11-13 ENCOUNTER — Telehealth: Payer: Self-pay | Admitting: *Deleted

## 2018-11-13 NOTE — Telephone Encounter (Signed)
Left message for patient to call and schedule April appointment with Kerin Ransom, PA

## 2018-11-14 NOTE — Telephone Encounter (Signed)
Left message @ 8:56am for patient to call and schedule April appointment with Kerin Ransom, PA

## 2018-11-16 ENCOUNTER — Telehealth (HOSPITAL_COMMUNITY): Payer: Self-pay

## 2018-11-16 NOTE — Telephone Encounter (Signed)
Pt called back and he was interested in participating in the Pulmonary Rehab Program. Patient stated yes. Patient will come in for orientation on 12/04/2018 and will attend the 1:30pm exercise class.  Mailed homework package. Tedra Sen[place]. Support Rep II

## 2018-11-20 ENCOUNTER — Encounter

## 2018-11-20 ENCOUNTER — Ambulatory Visit (HOSPITAL_BASED_OUTPATIENT_CLINIC_OR_DEPARTMENT_OTHER): Payer: 59 | Attending: Nurse Practitioner | Admitting: Pulmonary Disease

## 2018-11-20 VITALS — Ht 71.0 in | Wt 180.0 lb

## 2018-11-20 DIAGNOSIS — J9611 Chronic respiratory failure with hypoxia: Secondary | ICD-10-CM

## 2018-11-20 DIAGNOSIS — R0902 Hypoxemia: Secondary | ICD-10-CM | POA: Insufficient documentation

## 2018-11-20 DIAGNOSIS — R0683 Snoring: Secondary | ICD-10-CM

## 2018-11-20 DIAGNOSIS — J9621 Acute and chronic respiratory failure with hypoxia: Secondary | ICD-10-CM

## 2018-11-20 DIAGNOSIS — J449 Chronic obstructive pulmonary disease, unspecified: Secondary | ICD-10-CM | POA: Diagnosis not present

## 2018-11-26 DIAGNOSIS — J9611 Chronic respiratory failure with hypoxia: Secondary | ICD-10-CM | POA: Diagnosis not present

## 2018-11-26 NOTE — Procedures (Signed)
    Patient Name: James Stokes, James Stokes Date: 11/20/2018 Gender: Male D.O.B: 08-Apr-1956 Age (years): 46 Referring Provider: Fenton Foy NP Height (inches): 93 Interpreting Physician: Chesley Mires MD, ABSM Weight (lbs): 180 RPSGT: Carolin Coy BMI: 25 MRN: [id number] Neck Size: 16.75 <br> <br> CLINICAL INFORMATION Sleep Study Type: NPSG    Indication for sleep study: COPD, Daytime Fatigue, Hypertension, Snoring, and Polycythemia.    Epworth Sleepiness Score: 3    SLEEP STUDY TECHNIQUE As per the AASM Manual for the Scoring of Sleep and Associated Events v2.3 (April 2016) with a hypopnea requiring 4% desaturations.  The channels recorded and monitored were frontal, central and occipital EEG, electrooculogram (EOG), submentalis EMG (chin), nasal and oral airflow, thoracic and abdominal wall motion, anterior tibialis EMG, snore microphone, electrocardiogram, and pulse oximetry.  MEDICATIONS Medications self-administered by patient taken the night of the study : N/A  SLEEP ARCHITECTURE The study was initiated at 10:48:13 PM and ended at 5:35:36 AM.  Sleep onset time was 30.6 minutes and the sleep efficiency was 84.0%%. The total sleep time was 342 minutes.  Stage REM latency was 51.5 minutes.  The patient spent 13.3%% of the night in stage N1 sleep, 64.3%% in stage N2 sleep, 0.0%% in stage N3 and 22.4% in REM.  Alpha intrusion was absent.  Supine sleep was 4.09%.  RESPIRATORY PARAMETERS The overall apnea/hypopnea index (AHI) was 11.2 per hour. There were 3 total apneas, including 3 obstructive, 0 central and 0 mixed apneas. There were 61 hypopneas and 31 RERAs.  The AHI during Stage REM sleep was 36.9 per hour.  AHI while supine was 60.0 per hour.  The mean oxygen saturation was 91.2%. The minimum SpO2 during sleep was 74.0%.  He had 2 liters oxygen added during the study.  moderate snoring was noted during this study.  CARDIAC DATA The 2 lead EKG  demonstrated sinus rhythm. The mean heart rate was 70.5 beats per minute. Other EKG findings include: None. LEG MOVEMENT DATA The total PLMS were 0 with a resulting PLMS index of 0.0. Associated arousal with leg movement index was 0.0 .  IMPRESSIONS - Mild obstructive sleep apnea with an AHI of 11.2. - He had significant oxygen desaturation particularly during REM sleep.  SpO2 low was 74%. DIAGNOSIS - Obstructive Sleep Apnea (327.23 [G47.33 ICD-10]) - Nocturnal Hypoxemia (327.26 [G47.36 ICD-10]) RECOMMENDATIONS - Given the combination of obstructive sleep apnea and severe oxygen desaturation in the setting of severe COPD with emphysema and polycythemia, he should be referred back to the sleep lab for a full night CPAP titration study.  [Electronically signed] 11/26/2018 03:29 PM  Chesley Mires MD, [place], American Board of Sleep Medicine   NPI: [id number]

## 2018-12-04 ENCOUNTER — Encounter (HOSPITAL_COMMUNITY): Payer: Self-pay

## 2018-12-04 ENCOUNTER — Encounter (HOSPITAL_COMMUNITY)
Admission: RE | Admit: 2018-12-04 | Discharge: 2018-12-04 | Disposition: A | Discharge status: Home / Self Care | Payer: 59 | Source: Ambulatory Visit | Attending: Internal Medicine | Admitting: Internal Medicine

## 2018-12-04 VITALS — BP 146/80 | HR 98 | Temp 98.4°F | Resp 18 | Ht 69.75 in | Wt 194.9 lb

## 2018-12-04 DIAGNOSIS — J9611 Chronic respiratory failure with hypoxia: Secondary | ICD-10-CM | POA: Diagnosis not present

## 2018-12-04 NOTE — Progress Notes (Signed)
James Stokes 63 y.o. male Pulmonary Rehab Orientation Note Kendle who is referred to pulmonary rehab by Dr. Chase Caller for centrilobular Emphysema,  arrived today in Cardiac and Pulmonary Rehab for orientation to Pulmonary Rehab. He arrived ambulatory from the parking lot at the main entrance of the hospital. He does carry portable oxygen. Per pt, he uses oxygen at night when going to sleep. Color good, skin warm and dry. Patient is oriented to time and place. Patient's medical history, psychosocial health, and medications reviewed. Psychosocial assessment reveals pt lives with their spouse. Pt is currently retired. Pt hobbies include gardening. Pt reports his stress level is non existent. Pt is easy going laid back attitude.  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   Pt does not exhibit  signs of depression. Si PHQ2/9 score 0/2. Pt shows good  coping skills with positive outlook .  Will continue to monitor and evaluate progress toward psychosocial goal(s) of continued mental well being. Physical assessment reveals heart rate is normal, breath sounds clear to auscultation, no wheezes, rales, or rhonchi, mild rales heard both upper lobes. Grip strength equal, strong. Distal pulses palpable with no swelling. Patient reports hedoes take medications as prescribed. Patient states he follows a Regular diet. The patient reports no specific efforts to gain or lose weight.. Patient's weight will be monitored closely. Demonstration and practice of PLB using pulse oximeter. Patient able to return demonstration satisfactorily. Safety and hand hygiene in the exercise area reviewed with patient. Patient voices understanding of the information reviewed. Department expectations discussed with patient and achievable goals were set. The patient shows enthusiasm about attending the  program and we look forward to working with this nice gentleman. The patient is scheduled for a 6 min walk test on 12/05/2018 and to begin exercise on 12/12/2018 at 10:30.45 minutes was spent on a variety of activities such as assessment of the patient, obtaining baseline data including height, weight, BMI, and grip strength, verifying medical history, allergies, and current medications, and teaching patient strategies for performing tasks with less respiratory effort with emphasis on pursed lip breathing.[id number] Cherre Huger, BSN Cardiac and Training and development officer

## 2018-12-05 ENCOUNTER — Encounter (HOSPITAL_COMMUNITY)
Admission: RE | Admit: 2018-12-05 | Discharge: 2018-12-05 | Disposition: A | Discharge status: Home / Self Care | Payer: 59 | Source: Ambulatory Visit | Attending: Internal Medicine | Admitting: Internal Medicine

## 2018-12-05 ENCOUNTER — Other Ambulatory Visit: Payer: Self-pay | Admitting: General Surgery

## 2018-12-05 DIAGNOSIS — J9611 Chronic respiratory failure with hypoxia: Secondary | ICD-10-CM

## 2018-12-05 DIAGNOSIS — J449 Chronic obstructive pulmonary disease, unspecified: Secondary | ICD-10-CM

## 2018-12-05 DIAGNOSIS — R0902 Hypoxemia: Secondary | ICD-10-CM

## 2018-12-05 NOTE — Progress Notes (Signed)
Pulmonary Individual Treatment Plan  Patient Details  Name: James Stokes MRN: [id number] Date of Birth: 12-30-1955 Referring Provider:     Pulmonary Rehab Walk Test from 12/05/2018 in [place]  Referring Provider  [place]      Initial Encounter Date:    Pulmonary Rehab Walk Test from 12/05/2018 in [place]  Date  12/05/18      Visit Diagnosis: Chronic respiratory failure with hypoxia ([place])  Patient's Home Medications on Admission:   Current Outpatient Medications:  .  albuterol (PROVENTIL HFA;VENTOLIN HFA) 108 (90 Base) MCG/ACT inhaler, Inhale 2 puffs into the lungs every 6 (six) hours as needed for wheezing or shortness of breath (cough, shortness of breath or wheezing.)., Disp: 1 Inhaler, Rfl: 1 .  aspirin 81 MG tablet, Take 81 mg by mouth every morning. , Disp: , Rfl:  .  diltiazem (CARDIZEM CD) 300 MG 24 hr capsule, Take 1 capsule (300 mg total) by mouth daily., Disp: 30 capsule, Rfl: 11 .  fluticasone furoate-vilanterol (BREO ELLIPTA) 100-25 MCG/INH AEPB, Inhale 1 puff into the lungs daily., Disp: 30 each, Rfl: 5 .  losartan (COZAAR) 50 MG tablet, Take 1 tablet (50 mg total) by mouth daily., Disp: 30 tablet, Rfl: 11 .  Multiple Vitamins-Minerals (MULTIVITAMIN ADULT PO), Take 1 tablet by mouth every morning. , Disp: , Rfl:  .  nicotine (NICODERM CQ - DOSED IN MG/24 HOURS) 21 mg/24hr patch, PLACE 1 PATCH (21 MG TOTAL) ONTO THE SKIN DAILY., Disp: 28 patch, Rfl: 0 .  umeclidinium bromide (INCRUSE ELLIPTA) 62.5 MCG/INH AEPB, Inhale 1 puff into the lungs daily., Disp: 30 each, Rfl: 5  Past Medical History: Past Medical History:  Diagnosis Date  . Acute diastolic CHF (congestive heart failure) ([place]) 08/22/2018  . Atherosclerotic plaque on cardiac vessels, seen on CT chest 08/22/2018  . Dyslipidemia   . ETOH abuse   . HTN (hypertension)   . Hypertriglyceridemia   . Kidney stones   . Smoker     Tobacco  Use: Social History   Tobacco Use  Smoking Status Former Smoker  . Packs/day: 2.00  . Years: 43.00  . Pack years: 86.00  . Types: Cigarettes  . Last attempt to quit: 08/17/2018  . Years since quitting: 0.3  Smokeless Tobacco Never Used    Labs: Recent Chemical engineer    Labs for ITP Cardiac and Pulmonary Rehab Latest Ref Rng & Units 05/23/2013 12/02/2015 01/25/2017 03/24/2018 08/17/2018   Cholestrol 100 - 199 mg/dL 224(H) 267(H) 244(H) 235(H) -   LDLCALC 0 - 99 mg/dL - NOT CALC NOT CALC Comment -   LDLDIRECT <130 mg/dL 87 65 75 - -   HDL >39 mg/dL 38(L) 34(L) 36(L) 45 -   Trlycerides 0 - 149 mg/dL 455(H) 853(H) 695(H) 423(H) -   Hemoglobin A1c <5.7 % - 5.5 5.0 - -   PHART 7.350 - 7.450 - - - - 7.328(L)   PCO2ART 32.0 - 48.0 mmHg - - - - 61.1(H)   HCO3 20.0 - 28.0 mmol/L - - - - 32.1(H)   TCO2 22 - 32 mmol/L - - - - 34(H)   O2SAT % - - - - 79.0      Capillary Blood Glucose: No results found for: GLUCAP   Pulmonary Assessment Scores: Pulmonary Assessment Scores    Row Name 12/04/18 1458 12/05/18 1529       ADL UCSD   SOB Score total  22  -  CAT Score   CAT Score  4  -      mMRC Score   mMRC Score  -  0       Pulmonary Function Assessment:   Exercise Target Goals: Exercise Program Goal: Individual exercise prescription set using results from initial 6 min walk test and THRR while considering  patient's activity barriers and safety.   Exercise Prescription Goal: Initial exercise prescription builds to 30-45 minutes a day of aerobic activity, 2-3 days per week.  Home exercise guidelines will be given to patient during program as part of exercise prescription that the participant will acknowledge.  Activity Barriers & Risk Stratification: Activity Barriers & Cardiac Risk Stratification - 12/04/18 1444      Activity Barriers & Cardiac Risk Stratification   Activity Barriers  Shortness of Breath;Arthritis       6 Minute Walk: 6 Minute Walk    Row  Name 12/05/18 1530         6 Minute Walk   Phase  Initial     Distance  1400 feet     Walk Time  6 minutes     # of Rest Breaks  0     MPH  2.65     METS  3.07     RPE  11     Perceived Dyspnea   1     VO2 Peak  15.27     Symptoms  No     Resting HR  127 bpm     Resting BP  128/84     Resting Oxygen Saturation   92 %     Exercise Oxygen Saturation  during 6 min walk  86 %     Max Ex. HR  146 bpm     Max Ex. BP  158/86     2 Minute Post BP  142/80       Interval HR   1 Minute HR  121     2 Minute HR  121     3 Minute HR  130     4 Minute HR  133     5 Minute HR  131     6 Minute HR  146     2 Minute Post HR  125     Interval Heart Rate?  Yes       Interval Oxygen   Interval Oxygen?  Yes     Baseline Oxygen Saturation %  92 %     1 Minute Oxygen Saturation %  92 %     1 Minute Liters of Oxygen  0 L     2 Minute Oxygen Saturation %  88 %     2 Minute Liters of Oxygen  0 L     3 Minute Oxygen Saturation %  86 %     3 Minute Liters of Oxygen  0 L     4 Minute Oxygen Saturation %  87 %     4 Minute Liters of Oxygen  0 L     5 Minute Oxygen Saturation %  88 %     5 Minute Liters of Oxygen  0 L     6 Minute Oxygen Saturation %  88 %     6 Minute Liters of Oxygen  0 L     2 Minute Post Oxygen Saturation %  83 %     2 Minute Post Liters of Oxygen  0 L  Oxygen Initial Assessment: Oxygen Initial Assessment - 12/05/18 1529      Home Oxygen   Home Oxygen Device  Home Concentrator;Portable Concentrator    Sleep Oxygen Prescription  Continuous    Liters per minute  2    Home Exercise Oxygen Prescription  None    Home at Rest Exercise Oxygen Prescription  None    Compliance with Home Oxygen Use  Yes      Program Oxygen Prescription   Program Oxygen Prescription  None      Intervention   Short Term Goals  To learn and demonstrate proper use of respiratory medications;To learn and demonstrate proper pursed lip breathing techniques or other breathing  techniques.;To learn and understand importance of maintaining oxygen saturations>88%;To learn and understand importance of monitoring SPO2 with pulse oximeter and demonstrate accurate use of the pulse oximeter.;To learn and exhibit compliance with exercise, home and travel O2 prescription    Long  Term Goals  Exhibits compliance with exercise, home and travel O2 prescription;Verbalizes importance of monitoring SPO2 with pulse oximeter and return demonstration;Maintenance of O2 saturations>88%;Exhibits proper breathing techniques, such as pursed lip breathing or other method taught during program session;Compliance with respiratory medication;Demonstrates proper use of MDI's       Oxygen Re-Evaluation:   Oxygen Discharge (Final Oxygen Re-Evaluation):   Initial Exercise Prescription: Initial Exercise Prescription - 12/05/18 1500      Date of Initial Exercise RX and Referring Provider   Date  12/05/18    Referring Provider  Ramaswamy      Bike   Level  0.7    Minutes  17      NuStep   Level  3    SPM  80    Minutes  17      Arm Ergometer   Level  --    Minutes  --      Track   Laps  15    Minutes  17      Prescription Details   Frequency (times per week)  2    Duration  Progress to 45 minutes of aerobic exercise without signs/symptoms of physical distress      Intensity   THRR 40-80% of Max Heartrate  64-128    Ratings of Perceived Exertion  11-13    Perceived Dyspnea  0-4      Progression   Progression  Continue to progress workloads to maintain intensity without signs/symptoms of physical distress.      Resistance Training   Training Prescription  Yes    Weight  blue bands    Reps  10-15       Perform Capillary Blood Glucose checks as needed.  Exercise Prescription Changes:   Exercise Comments:   Exercise Goals and Review: Exercise Goals    Row Name 12/04/18 1446             Exercise Goals   Increase Physical Activity  Yes       Intervention   Provide advice, education, support and counseling about physical activity/exercise needs.;Develop an individualized exercise prescription for aerobic and resistive training based on initial evaluation findings, risk stratification, comorbidities and participant's personal goals.       Expected Outcomes  Short Term: Attend rehab on a regular basis to increase amount of physical activity.;Long Term: Add in home exercise to make exercise part of routine and to increase amount of physical activity.;Long Term: Exercising regularly at least 3-5 days a week.       Increase  Strength and Stamina  Yes       Intervention  Provide advice, education, support and counseling about physical activity/exercise needs.;Develop an individualized exercise prescription for aerobic and resistive training based on initial evaluation findings, risk stratification, comorbidities and participant's personal goals.       Expected Outcomes  Short Term: Increase workloads from initial exercise prescription for resistance, speed, and METs.;Short Term: Perform resistance training exercises routinely during rehab and add in resistance training at home;Long Term: Improve cardiorespiratory fitness, muscular endurance and strength as measured by increased METs and functional capacity (6MWT)       Able to understand and use rate of perceived exertion (RPE) scale  Yes       Intervention  Provide education and explanation on how to use RPE scale       Expected Outcomes  Short Term: Able to use RPE daily in rehab to express subjective intensity level;Long Term:  Able to use RPE to guide intensity level when exercising independently       Able to understand and use Dyspnea scale  Yes       Intervention  Provide education and explanation on how to use Dyspnea scale       Expected Outcomes  Short Term: Able to use Dyspnea scale daily in rehab to express subjective sense of shortness of breath during exertion;Long Term: Able to use Dyspnea scale to  guide intensity level when exercising independently       Knowledge and understanding of Target Heart Rate Range (THRR)  Yes       Intervention  Provide education and explanation of THRR including how the numbers were predicted and where they are located for reference       Expected Outcomes  Short Term: Able to state/look up THRR;Short Term: Able to use daily as guideline for intensity in rehab;Long Term: Able to use THRR to govern intensity when exercising independently       Understanding of Exercise Prescription  Yes       Intervention  Provide education, explanation, and written materials on patient's individual exercise prescription       Expected Outcomes  Short Term: Able to explain program exercise prescription;Long Term: Able to explain home exercise prescription to exercise independently          Exercise Goals Re-Evaluation :   Discharge Exercise Prescription (Final Exercise Prescription Changes):   Nutrition:  Target Goals: Understanding of nutrition guidelines, daily intake of sodium 1500mg , cholesterol 200mg , calories 30% from fat and 7% or less from saturated fats, daily to have 5 or more servings of fruits and vegetables.  Biometrics:    Nutrition Therapy Plan and Nutrition Goals:   Nutrition Assessments:   Nutrition Goals Re-Evaluation:   Nutrition Goals Discharge (Final Nutrition Goals Re-Evaluation):   Psychosocial: Target Goals: Acknowledge presence or absence of significant depression and/or stress, maximize coping skills, provide positive support system. Participant is able to verbalize types and ability to use techniques and skills needed for reducing stress and depression.  Initial Review & Psychosocial Screening: Initial Psych Review & Screening - 12/04/18 1501      Initial Review   Current issues with  None Identified      Family Dynamics   Good Support System?  Yes    Comments  Marriage of 30+ years      Barriers   Psychosocial barriers  to participate in program  There are no identifiable barriers or psychosocial needs.      Screening Interventions  Expected Outcomes  Long Term goal: The participant improves quality of Life and PHQ9 Scores as seen by post scores and/or verbalization of changes;Short Term goal: Identification and review with participant of any Quality of Life or Depression concerns found by scoring the questionnaire.       Quality of Life Scores:  Scores of 19 and below usually indicate a poorer quality of life in these areas.  A difference of  2-3 points is a clinically meaningful difference.  A difference of 2-3 points in the total score of the Quality of Life Index has been associated with significant improvement in overall quality of life, self-image, physical symptoms, and general health in studies assessing change in quality of life.   PHQ-9: Recent Review Flowsheet Data    Depression screen [facility] 2/9 12/04/2018 12/31/2015 10/14/2015   Decreased Interest 0 0 0   Down, Depressed, Hopeless 0 0 0   PHQ - 2 Score 0 0 0   Altered sleeping 0 - -   Tired, decreased energy 1 - -   Change in appetite 1 - -   Feeling bad or failure about yourself  0 - -   Trouble concentrating 0 - -   Moving slowly or fidgety/restless 0 - -   Suicidal thoughts 0 - -   PHQ-9 Score 2 - -   Difficult doing work/chores Not difficult at all - -     Interpretation of Total Score  Total Score Depression Severity:  1-4 = Minimal depression, 5-9 = Mild depression, 10-14 = Moderate depression, 15-19 = Moderately severe depression, 20-27 = Severe depression   Psychosocial Evaluation and Intervention:   Psychosocial Re-Evaluation:   Psychosocial Discharge (Final Psychosocial Re-Evaluation):    Education: Education Goals: Education classes will be provided on a weekly basis, covering required topics. Participant will state understanding/return demonstration of topics presented.  Learning Barriers/Preferences: Learning  Barriers/Preferences - 12/04/18 1502      Learning Barriers/Preferences   Learning Barriers  Sight    Learning Preferences  Skilled Demonstration;Group Instruction;Individual Instruction       Education Topics: How Lungs Work and Diseases: - Discuss the anatomy of the lungs and diseases that can affect the lungs, such as COPD.   Exercise: -Discuss the importance of exercise, FITT principles of exercise, normal and abnormal responses to exercise, and how to exercise safely.   Environmental Irritants: -Discuss types of environmental irritants and how to limit exposure to environmental irritants.   Meds/Inhalers and oxygen: - Discuss respiratory medications, definition of an inhaler and oxygen, and the proper way to use an inhaler and oxygen.   Energy Saving Techniques: - Discuss methods to conserve energy and decrease shortness of breath when performing activities of daily living.    Bronchial Hygiene / Breathing Techniques: - Discuss breathing mechanics, pursed-lip breathing technique,  proper posture, effective ways to clear airways, and other functional breathing techniques   Cleaning Equipment: - Provides group verbal and written instruction about the health risks of elevated stress, cause of high stress, and healthy ways to reduce stress.   Nutrition I: Fats: - Discuss the types of cholesterol, what cholesterol does to the body, and how cholesterol levels can be controlled.   Nutrition II: Labels: -Discuss the different components of food labels and how to read food labels.   Respiratory Infections: - Discuss the signs and symptoms of respiratory infections, ways to prevent respiratory infections, and the importance of seeking medical treatment when having a respiratory infection.   Stress I: Signs  and Symptoms: - Discuss the causes of stress, how stress may lead to anxiety and depression, and ways to limit stress.   Stress II: Relaxation: -Discuss relaxation  techniques to limit stress.   Oxygen for Home/Travel: - Discuss how to prepare for travel when on oxygen and proper ways to transport and store oxygen to ensure safety.   Knowledge Questionnaire Score: Knowledge Questionnaire Score - 12/04/18 1507      Knowledge Questionnaire Score   Pre Score  17.5       Core Components/Risk Factors/Patient Goals at Admission: Personal Goals and Risk Factors at Admission - 12/04/18 1511      Core Components/Risk Factors/Patient Goals on Admission   Improve shortness of breath with ADL's  Yes    Intervention  Provide education, individualized exercise plan and daily activity instruction to help decrease symptoms of SOB with activities of daily living.    Expected Outcomes  Short Term: Improve cardiorespiratory fitness to achieve a reduction of symptoms when performing ADLs;Long Term: Be able to perform more ADLs without symptoms or delay the onset of symptoms    Hypertension  Yes    Intervention  Provide education on lifestyle modifcations including regular physical activity/exercise, weight management, moderate sodium restriction and increased consumption of fresh fruit, vegetables, and low fat dairy, alcohol moderation, and smoking cessation.;Monitor prescription use compliance.    Expected Outcomes  Short Term: Continued assessment and intervention until BP is < 140/64mm HG in hypertensive participants. < 130/1mm HG in hypertensive participants with diabetes, heart failure or chronic kidney disease.;Long Term: Maintenance of blood pressure at goal levels.       Core Components/Risk Factors/Patient Goals Review:    Core Components/Risk Factors/Patient Goals at Discharge (Final Review):    ITP Comments: ITP Comments    Row Name 12/04/18 1443           ITP Comments  Dr. Manfred Arch Medical Director Pulmonary Rehab          Comments:

## 2018-12-11 ENCOUNTER — Telehealth (HOSPITAL_COMMUNITY): Payer: Self-pay

## 2018-12-11 NOTE — Telephone Encounter (Signed)
Pt called informing pt of Cardiac and Pulmonary rehab closure for the next 2 weeks.   Bernardo Brayman RN, BSN Cardiac and Pulmonary Rehab RN  

## 2018-12-12 ENCOUNTER — Other Ambulatory Visit: Payer: Self-pay | Admitting: Cardiovascular Disease

## 2018-12-12 ENCOUNTER — Ambulatory Visit (HOSPITAL_COMMUNITY): Payer: 59

## 2018-12-12 ENCOUNTER — Telehealth: Payer: Self-pay | Admitting: Nurse Practitioner

## 2018-12-12 NOTE — Telephone Encounter (Signed)
Called the patient and read to him the notation from Dr. Halford Chessman based on his sleep study results.  "Given the combination of obstructive sleep apnea and severe oxygen desaturation in the setting of severe COPD with emphysema and polycythemia, he should be referred back to the sleep lab for a full night CPAP titration study."  Advised patient of response from Dr. Halford Chessman. Patient confirmed he has set up the titration study. Apologized to the patient for any confusion.  Nothing further needed at this time.

## 2018-12-12 NOTE — Telephone Encounter (Signed)
Order was placed for pt to have titration study.  Looks like TN ordered sleep study and VS read it and Dana placed order.  I scheduled pt for 4/24 at sleep lab.  When I called pt to give him the appt info he states he did not know anything about having to go back for titration study.  I told him I would have the nurse to call him back and explain why titration study is needed.  He stated to please call him on cell - [phone] and may need to leave him a message due to bad service in the area where he will be.

## 2018-12-13 NOTE — Progress Notes (Signed)
James Stokes 63 y.o. male  DOB: [birth year] MRN: [id number]           Nutrition Note 1. Chronic respiratory failure with hypoxia [facility])    Past Medical History:  Diagnosis Date  . Acute diastolic CHF (congestive heart failure) ([place]) 08/22/2018  . Atherosclerotic plaque on cardiac vessels, seen on CT chest 08/22/2018  . Dyslipidemia   . ETOH abuse   . HTN (hypertension)   . Hypertriglyceridemia   . Kidney stones   . Smoker    Meds reviewed.    Current Outpatient Medications (Cardiovascular):  .  diltiazem (CARDIZEM CD) 300 MG 24 hr capsule, Take 1 capsule (300 mg total) by mouth daily. Marland Kitchen  losartan (COZAAR) 50 MG tablet, Take 1 tablet (50 mg total) by mouth daily.  Current Outpatient Medications (Respiratory):  .  albuterol (PROVENTIL HFA;VENTOLIN HFA) 108 (90 Base) MCG/ACT inhaler, Inhale 2 puffs into the lungs every 6 (six) hours as needed for wheezing or shortness of breath (cough, shortness of breath or wheezing.). Marland Kitchen  fluticasone furoate-vilanterol (BREO ELLIPTA) 100-25 MCG/INH AEPB, Inhale 1 puff into the lungs daily. Marland Kitchen  umeclidinium bromide (INCRUSE ELLIPTA) 62.5 MCG/INH AEPB, Inhale 1 puff into the lungs daily.  Current Outpatient Medications (Analgesics):  .  aspirin 81 MG tablet, Take 81 mg by mouth every morning.    Current Outpatient Medications (Other):  Marland Kitchen  Multiple Vitamins-Minerals (MULTIVITAMIN ADULT PO), Take 1 tablet by mouth every morning.  .  nicotine (NICODERM CQ - DOSED IN MG/24 HOURS) 21 mg/24hr patch, PLACE 1 PATCH (21 MG TOTAL) ONTO THE SKIN DAILY.   Ht: Ht Readings from Last 1 Encounters:  12/04/18 5' 9.75" (1.772 m)     Wt:  Wt Readings from Last 6 Encounters:  12/04/18 194 lb 14.2 oz (88.4 kg)  11/20/18 180 lb (81.6 kg)  10/04/18 186 lb (84.4 kg)  09/28/18 183 lb 3.2 oz (83.1 kg)  08/28/18 175 lb 12.8 oz (79.7 kg)  08/22/18 180 lb 8 oz (81.9 kg)     BMI: Body mass index is 28.16 kg/m.    Current tobacco use? No    Labs:  Lipid  Panel     Component Value Date/Time   CHOL 235 (H) 03/24/2018 0939   TRIG 423 (H) 03/24/2018 0939   HDL 45 03/24/2018 0939   CHOLHDL 5.2 (H) 03/24/2018 0939   CHOLHDL 6.8 (H) 01/25/2017 0901   VLDL NOT CALC 01/25/2017 0901   LDLCALC Comment 03/24/2018 0939   LDLDIRECT 75 01/25/2017 0901    Lab Results  Component Value Date   HGBA1C 5.0 01/25/2017    Nutrition Diagnosis ? Food-and nutrition-related knowledge deficit related to lack of exposure to information as related to diagnosis of pulmonary disease ? Overweight/obesity related to excessive energy intake as evidenced by a BMI of Body mass index is 28.16 kg/m.  Goal(s) 1. Pt to identify and limit food sources of sodium, saturated fat, trans fat, refined carbohydrates. 2. Pt to build a healthy plate including fruits, vegetables, whole grains, lean protein, and low-fat dairy products in a healthy meal plan. 3. Pt to Identify food quantities necessary to achieve wt loss of  -2# per week to a goal wt loss of 2.7-10.9 kg (6-24 lb) at graduation from pulmonary rehab.  Plan:  Pt to attend Pulmonary Nutrition class Will provide client-centered nutrition education as part of interdisciplinary care.    Monitor and Evaluate progress toward nutrition goal with team.   Laurina Bustle, MS, RD, LDN 12/13/2018 8:17  AM

## 2018-12-14 ENCOUNTER — Ambulatory Visit (HOSPITAL_COMMUNITY): Payer: 59

## 2018-12-14 NOTE — Telephone Encounter (Signed)
Per pt's call following up on this refill needs it today please he is going out of town.   It only last 28 days pt would like to not have to call to refill every time.  Please give him a call when done.

## 2018-12-14 NOTE — Telephone Encounter (Signed)
Rx(s) sent to pharmacy electronically.  

## 2018-12-14 NOTE — Telephone Encounter (Signed)
°*  STAT* If patient is at the pharmacy, call can be transferred to refill team.   1. Which medications need to be refilled? (please list name of each medication and dose if known) nicotine patches  2. Which pharmacy/location (including street and city if local pharmacy) is medication to be sent to? [facility]  3. Do they need a 30 day or 90 day supply? 28 days or more 14 in a box. Also patient do not live in [place] but is in town for a few hours.

## 2018-12-18 ENCOUNTER — Telehealth (HOSPITAL_COMMUNITY): Payer: Self-pay

## 2018-12-18 ENCOUNTER — Encounter (HOSPITAL_COMMUNITY): Payer: Self-pay

## 2018-12-18 NOTE — Telephone Encounter (Signed)
Pt called and message left informing pt of Cardiac and Pulmonary rehab closure for additional 4 weeks. Pt instructed to call back with any questions or concerns.   Jhony Antrim RN, BSN Cardiac and Pulmonary Rehab RN  

## 2018-12-18 NOTE — Progress Notes (Signed)
Pulmonary Individual Treatment Plan  Patient Details  Name: James Stokes MRN: [id number] Date of Birth: 1956-01-20 Referring Provider:     Pulmonary Rehab Walk Test from 12/05/2018 in [facility]  Referring Provider  [place]      Initial Encounter Date:    Pulmonary Rehab Walk Test from 12/05/2018 in [facility]  Date  12/05/18      Visit Diagnosis: Chronic respiratory failure with hypoxia ([place])  Patient's Home Medications on Admission:   Current Outpatient Medications:  .  albuterol (PROVENTIL HFA;VENTOLIN HFA) 108 (90 Base) MCG/ACT inhaler, Inhale 2 puffs into the lungs every 6 (six) hours as needed for wheezing or shortness of breath (cough, shortness of breath or wheezing.)., Disp: 1 Inhaler, Rfl: 1 .  aspirin 81 MG tablet, Take 81 mg by mouth every morning. , Disp: , Rfl:  .  diltiazem (CARDIZEM CD) 300 MG 24 hr capsule, Take 1 capsule (300 mg total) by mouth daily., Disp: 30 capsule, Rfl: 11 .  fluticasone furoate-vilanterol (BREO ELLIPTA) 100-25 MCG/INH AEPB, Inhale 1 puff into the lungs daily., Disp: 30 each, Rfl: 5 .  losartan (COZAAR) 50 MG tablet, Take 1 tablet (50 mg total) by mouth daily., Disp: 30 tablet, Rfl: 11 .  Multiple Vitamins-Minerals (MULTIVITAMIN ADULT PO), Take 1 tablet by mouth every morning. , Disp: , Rfl:  .  nicotine (NICODERM CQ - DOSED IN MG/24 HOURS) 21 mg/24hr patch, Place 1 patch (21 mg total) onto the skin daily., Disp: 28 patch, Rfl: 5 .  umeclidinium bromide (INCRUSE ELLIPTA) 62.5 MCG/INH AEPB, Inhale 1 puff into the lungs daily., Disp: 30 each, Rfl: 5  Past Medical History: Past Medical History:  Diagnosis Date  . Acute diastolic CHF (congestive heart failure) ([place]) 08/22/2018  . Atherosclerotic plaque on cardiac vessels, seen on CT chest 08/22/2018  . Dyslipidemia   . ETOH abuse   . HTN (hypertension)   . Hypertriglyceridemia   . Kidney stones   . Smoker     Tobacco  Use: Social History   Tobacco Use  Smoking Status Former Smoker  . Packs/day: 2.00  . Years: 43.00  . Pack years: 86.00  . Types: Cigarettes  . Last attempt to quit: 08/17/2018  . Years since quitting: 0.3  Smokeless Tobacco Never Used    Labs: Recent Chemical engineer    Labs for ITP Cardiac and Pulmonary Rehab Latest Ref Rng & Units 05/23/2013 12/02/2015 01/25/2017 03/24/2018 08/17/2018   Cholestrol 100 - 199 mg/dL 224(H) 267(H) 244(H) 235(H) -   LDLCALC 0 - 99 mg/dL - NOT CALC NOT CALC Comment -   LDLDIRECT <130 mg/dL 87 65 75 - -   HDL >39 mg/dL 38(L) 34(L) 36(L) 45 -   Trlycerides 0 - 149 mg/dL 455(H) 853(H) 695(H) 423(H) -   Hemoglobin A1c <5.7 % - 5.5 5.0 - -   PHART 7.350 - 7.450 - - - - 7.328(L)   PCO2ART 32.0 - 48.0 mmHg - - - - 61.1(H)   HCO3 20.0 - 28.0 mmol/L - - - - 32.1(H)   TCO2 22 - 32 mmol/L - - - - 34(H)   O2SAT % - - - - 79.0      Capillary Blood Glucose: No results found for: GLUCAP   Pulmonary Assessment Scores: Pulmonary Assessment Scores    Row Name 12/04/18 1458 12/05/18 1529       ADL UCSD   SOB Score total  22  -  CAT Score   CAT Score  4  -      mMRC Score   mMRC Score  -  0       Pulmonary Function Assessment:   Exercise Target Goals: Exercise Program Goal: Individual exercise prescription set using results from initial 6 min walk test and THRR while considering  patient's activity barriers and safety.   Exercise Prescription Goal: Initial exercise prescription builds to 30-45 minutes a day of aerobic activity, 2-3 days per week.  Home exercise guidelines will be given to patient during program as part of exercise prescription that the participant will acknowledge.  Activity Barriers & Risk Stratification: Activity Barriers & Cardiac Risk Stratification - 12/04/18 1444      Activity Barriers & Cardiac Risk Stratification   Activity Barriers  Shortness of Breath;Arthritis       6 Minute Walk: 6 Minute Walk    Row  Name 12/05/18 1530         6 Minute Walk   Phase  Initial     Distance  1400 feet     Walk Time  6 minutes     # of Rest Breaks  0     MPH  2.65     METS  3.07     RPE  11     Perceived Dyspnea   1     VO2 Peak  15.27     Symptoms  No     Resting HR  127 bpm     Resting BP  128/84     Resting Oxygen Saturation   92 %     Exercise Oxygen Saturation  during 6 min walk  86 %     Max Ex. HR  146 bpm     Max Ex. BP  158/86     2 Minute Post BP  142/80       Interval HR   1 Minute HR  121     2 Minute HR  121     3 Minute HR  130     4 Minute HR  133     5 Minute HR  131     6 Minute HR  146     2 Minute Post HR  125     Interval Heart Rate?  Yes       Interval Oxygen   Interval Oxygen?  Yes     Baseline Oxygen Saturation %  92 %     1 Minute Oxygen Saturation %  92 %     1 Minute Liters of Oxygen  0 L     2 Minute Oxygen Saturation %  88 %     2 Minute Liters of Oxygen  0 L     3 Minute Oxygen Saturation %  86 %     3 Minute Liters of Oxygen  0 L     4 Minute Oxygen Saturation %  87 %     4 Minute Liters of Oxygen  0 L     5 Minute Oxygen Saturation %  88 %     5 Minute Liters of Oxygen  0 L     6 Minute Oxygen Saturation %  88 %     6 Minute Liters of Oxygen  0 L     2 Minute Post Oxygen Saturation %  83 %     2 Minute Post Liters of Oxygen  0 L  Oxygen Initial Assessment: Oxygen Initial Assessment - 12/05/18 1529      Home Oxygen   Home Oxygen Device  Home Concentrator;Portable Concentrator    Sleep Oxygen Prescription  Continuous    Liters per minute  2    Home Exercise Oxygen Prescription  None    Home at Rest Exercise Oxygen Prescription  None    Compliance with Home Oxygen Use  Yes      Program Oxygen Prescription   Program Oxygen Prescription  None      Intervention   Short Term Goals  To learn and demonstrate proper use of respiratory medications;To learn and demonstrate proper pursed lip breathing techniques or other breathing  techniques.;To learn and understand importance of maintaining oxygen saturations>88%;To learn and understand importance of monitoring SPO2 with pulse oximeter and demonstrate accurate use of the pulse oximeter.;To learn and exhibit compliance with exercise, home and travel O2 prescription    Long  Term Goals  Exhibits compliance with exercise, home and travel O2 prescription;Verbalizes importance of monitoring SPO2 with pulse oximeter and return demonstration;Maintenance of O2 saturations>88%;Exhibits proper breathing techniques, such as pursed lip breathing or other method taught during program session;Compliance with respiratory medication;Demonstrates proper use of MDI's       Oxygen Re-Evaluation: Oxygen Re-Evaluation    Row Name 12/18/18 1021             Program Oxygen Prescription   Program Oxygen Prescription  None         Home Oxygen   Home Oxygen Device  Home Concentrator;Portable Concentrator       Sleep Oxygen Prescription  Continuous       Liters per minute  2       Home Exercise Oxygen Prescription  None       Home at Rest Exercise Oxygen Prescription  None       Compliance with Home Oxygen Use  Yes         Goals/Expected Outcomes   Short Term Goals  To learn and demonstrate proper use of respiratory medications;To learn and demonstrate proper pursed lip breathing techniques or other breathing techniques.;To learn and understand importance of maintaining oxygen saturations>88%;To learn and understand importance of monitoring SPO2 with pulse oximeter and demonstrate accurate use of the pulse oximeter.;To learn and exhibit compliance with exercise, home and travel O2 prescription       Long  Term Goals  Exhibits compliance with exercise, home and travel O2 prescription;Verbalizes importance of monitoring SPO2 with pulse oximeter and return demonstration;Maintenance of O2 saturations>88%;Exhibits proper breathing techniques, such as pursed lip breathing or other method taught  during program session;Compliance with respiratory medication;Demonstrates proper use of MDI's       Goals/Expected Outcomes  compliance          Oxygen Discharge (Final Oxygen Re-Evaluation): Oxygen Re-Evaluation - 12/18/18 1021      Program Oxygen Prescription   Program Oxygen Prescription  None      Home Oxygen   Home Oxygen Device  Home Concentrator;Portable Concentrator    Sleep Oxygen Prescription  Continuous    Liters per minute  2    Home Exercise Oxygen Prescription  None    Home at Rest Exercise Oxygen Prescription  None    Compliance with Home Oxygen Use  Yes      Goals/Expected Outcomes   Short Term Goals  To learn and demonstrate proper use of respiratory medications;To learn and demonstrate proper pursed lip breathing techniques or other breathing techniques.;To learn and  understand importance of maintaining oxygen saturations>88%;To learn and understand importance of monitoring SPO2 with pulse oximeter and demonstrate accurate use of the pulse oximeter.;To learn and exhibit compliance with exercise, home and travel O2 prescription    Long  Term Goals  Exhibits compliance with exercise, home and travel O2 prescription;Verbalizes importance of monitoring SPO2 with pulse oximeter and return demonstration;Maintenance of O2 saturations>88%;Exhibits proper breathing techniques, such as pursed lip breathing or other method taught during program session;Compliance with respiratory medication;Demonstrates proper use of MDI's    Goals/Expected Outcomes  compliance       Initial Exercise Prescription: Initial Exercise Prescription - 12/05/18 1500      Date of Initial Exercise RX and Referring Provider   Date  12/05/18    Referring Provider  Ramaswamy      Bike   Level  0.7    Minutes  17      NuStep   Level  3    SPM  80    Minutes  17      Arm Ergometer   Level  --    Minutes  --      Track   Laps  15    Minutes  17      Prescription Details   Frequency  (times per week)  2    Duration  Progress to 45 minutes of aerobic exercise without signs/symptoms of physical distress      Intensity   THRR 40-80% of Max Heartrate  64-128    Ratings of Perceived Exertion  11-13    Perceived Dyspnea  0-4      Progression   Progression  Continue to progress workloads to maintain intensity without signs/symptoms of physical distress.      Resistance Training   Training Prescription  Yes    Weight  blue bands    Reps  10-15       Perform Capillary Blood Glucose checks as needed.  Exercise Prescription Changes:   Exercise Comments:   Exercise Goals and Review: Exercise Goals    Row Name 12/04/18 1446             Exercise Goals   Increase Physical Activity  Yes       Intervention  Provide advice, education, support and counseling about physical activity/exercise needs.;Develop an individualized exercise prescription for aerobic and resistive training based on initial evaluation findings, risk stratification, comorbidities and participant's personal goals.       Expected Outcomes  Short Term: Attend rehab on a regular basis to increase amount of physical activity.;Long Term: Add in home exercise to make exercise part of routine and to increase amount of physical activity.;Long Term: Exercising regularly at least 3-5 days a week.       Increase Strength and Stamina  Yes       Intervention  Provide advice, education, support and counseling about physical activity/exercise needs.;Develop an individualized exercise prescription for aerobic and resistive training based on initial evaluation findings, risk stratification, comorbidities and participant's personal goals.       Expected Outcomes  Short Term: Increase workloads from initial exercise prescription for resistance, speed, and METs.;Short Term: Perform resistance training exercises routinely during rehab and add in resistance training at home;Long Term: Improve cardiorespiratory fitness,  muscular endurance and strength as measured by increased METs and functional capacity (6MWT)       Able to understand and use rate of perceived exertion (RPE) scale  Yes       Intervention  Provide education and explanation on how to use RPE scale       Expected Outcomes  Short Term: Able to use RPE daily in rehab to express subjective intensity level;Long Term:  Able to use RPE to guide intensity level when exercising independently       Able to understand and use Dyspnea scale  Yes       Intervention  Provide education and explanation on how to use Dyspnea scale       Expected Outcomes  Short Term: Able to use Dyspnea scale daily in rehab to express subjective sense of shortness of breath during exertion;Long Term: Able to use Dyspnea scale to guide intensity level when exercising independently       Knowledge and understanding of Target Heart Rate Range (THRR)  Yes       Intervention  Provide education and explanation of THRR including how the numbers were predicted and where they are located for reference       Expected Outcomes  Short Term: Able to state/look up THRR;Short Term: Able to use daily as guideline for intensity in rehab;Long Term: Able to use THRR to govern intensity when exercising independently       Understanding of Exercise Prescription  Yes       Intervention  Provide education, explanation, and written materials on patient's individual exercise prescription       Expected Outcomes  Short Term: Able to explain program exercise prescription;Long Term: Able to explain home exercise prescription to exercise independently          Exercise Goals Re-Evaluation : Exercise Goals Re-Evaluation    Row Name 12/18/18 1021             Exercise Goal Re-Evaluation   Exercise Goals Review  Increase Physical Activity;Increase Strength and Stamina;Able to understand and use rate of perceived exertion (RPE) scale;Able to understand and use Dyspnea scale;Knowledge and understanding of  Target Heart Rate Range (THRR);Understanding of Exercise Prescription       Comments  Pt will begin exercise when our department opens back up.       Expected Outcomes  Through exercise at rehab and at home, the patient will decrease shortness of breath with daily activities and feel confident in carrying out an exercise regime at home.           Discharge Exercise Prescription (Final Exercise Prescription Changes):   Nutrition:  Target Goals: Understanding of nutrition guidelines, daily intake of sodium 1500mg , cholesterol 200mg , calories 30% from fat and 7% or less from saturated fats, daily to have 5 or more servings of fruits and vegetables.  Biometrics:    Nutrition Therapy Plan and Nutrition Goals: Nutrition Therapy & Goals - 12/13/18 0819      Nutrition Therapy   Diet  heart healthy      Personal Nutrition Goals   Nutrition Goal  Pt to identify and limit food sources of sodium, saturated fat, trans fat, refined carbohydrates.    Personal Goal #2  Pt to build a healthy plate including fruits, vegetables, whole grains, lean protein, and low-fat dairy products in a healthy meal plan.    Personal Goal #3  Pt to Identify food quantities necessary to achieve wt loss of  -2# per week to a goal wt loss of 2.7-10.9 kg (6-24 lb) at graduation from pulmonary rehab.      Intervention Plan   Intervention  Prescribe, educate and counsel regarding individualized specific dietary modifications aiming towards targeted core components  such as weight, hypertension, lipid management, diabetes, heart failure and other comorbidities.    Expected Outcomes  Short Term Goal: Understand basic principles of dietary content, such as calories, fat, sodium, cholesterol and nutrients.;Long Term Goal: Adherence to prescribed nutrition plan.       Nutrition Assessments: Nutrition Assessments - 12/13/18 0820      Rate Your Plate Scores   Pre Score  59       Nutrition Goals Re-Evaluation: Nutrition  Goals Re-Evaluation    Row Name 12/13/18 0819             Goals   Current Weight  194 lb 14.2 oz (88.4 kg)          Nutrition Goals Discharge (Final Nutrition Goals Re-Evaluation): Nutrition Goals Re-Evaluation - 12/13/18 0819      Goals   Current Weight  194 lb 14.2 oz (88.4 kg)       Psychosocial: Target Goals: Acknowledge presence or absence of significant depression and/or stress, maximize coping skills, provide positive support system. Participant is able to verbalize types and ability to use techniques and skills needed for reducing stress and depression.  Initial Review & Psychosocial Screening: Initial Psych Review & Screening - 12/04/18 1501      Initial Review   Current issues with  None Identified      Family Dynamics   Good Support System?  Yes    Comments  Marriage of 30+ years      Barriers   Psychosocial barriers to participate in program  There are no identifiable barriers or psychosocial needs.      Screening Interventions   Expected Outcomes  Long Term goal: The participant improves quality of Life and PHQ9 Scores as seen by post scores and/or verbalization of changes;Short Term goal: Identification and review with participant of any Quality of Life or Depression concerns found by scoring the questionnaire.       Quality of Life Scores:  Scores of 19 and below usually indicate a poorer quality of life in these areas.  A difference of  2-3 points is a clinically meaningful difference.  A difference of 2-3 points in the total score of the Quality of Life Index has been associated with significant improvement in overall quality of life, self-image, physical symptoms, and general health in studies assessing change in quality of life.  PHQ-9: Recent Review Flowsheet Data    Depression screen [facility] 2/9 12/04/2018 12/31/2015 10/14/2015   Decreased Interest 0 0 0   Down, Depressed, Hopeless 0 0 0   PHQ - 2 Score 0 0 0   Altered sleeping 0 - -   Tired, decreased  energy 1 - -   Change in appetite 1 - -   Feeling bad or failure about yourself  0 - -   Trouble concentrating 0 - -   Moving slowly or fidgety/restless 0 - -   Suicidal thoughts 0 - -   PHQ-9 Score 2 - -   Difficult doing work/chores Not difficult at all - -     Interpretation of Total Score  Total Score Depression Severity:  1-4 = Minimal depression, 5-9 = Mild depression, 10-14 = Moderate depression, 15-19 = Moderately severe depression, 20-27 = Severe depression   Psychosocial Evaluation and Intervention: Psychosocial Evaluation - 12/18/18 1619      Psychosocial Evaluation & Interventions   Comments  pt has not begun exercise prior to departmental closure. Will plan to re-evaulate pt progress upon reopening.  Continue Psychosocial Services   Follow up required by staff       Psychosocial Re-Evaluation:   Psychosocial Discharge (Final Psychosocial Re-Evaluation):   Education: Education Goals: Education classes will be provided on a weekly basis, covering required topics. Participant will state understanding/return demonstration of topics presented.  Learning Barriers/Preferences: Learning Barriers/Preferences - 12/04/18 1502      Learning Barriers/Preferences   Learning Barriers  Sight    Learning Preferences  Skilled Demonstration;Group Instruction;Individual Instruction       Education Topics: Risk Factor Reduction:  -Group instruction that is supported by a PowerPoint presentation. Instructor discusses the definition of a risk factor, different risk factors for pulmonary disease, and how the heart and lungs work together.     Nutrition for Pulmonary Patient:  -Group instruction provided by PowerPoint slides, verbal discussion, and written materials to support subject matter. The instructor gives an explanation and review of healthy diet recommendations, which includes a discussion on weight management, recommendations for fruit and vegetable consumption, as well  as protein, fluid, caffeine, fiber, sodium, sugar, and alcohol. Tips for eating when patients are short of breath are discussed.   Pursed Lip Breathing:  -Group instruction that is supported by demonstration and informational handouts. Instructor discusses the benefits of pursed lip and diaphragmatic breathing and detailed demonstration on how to preform both.     Oxygen Safety:  -Group instruction provided by PowerPoint, verbal discussion, and written material to support subject matter. There is an overview of "What is Oxygen" and "Why do we need it".  Instructor also reviews how to create a safe environment for oxygen use, the importance of using oxygen as prescribed, and the risks of noncompliance. There is a brief discussion on traveling with oxygen and resources the patient may utilize.   Oxygen Equipment:  -Group instruction provided by [facility] Staff utilizing handouts, written materials, and equipment demonstrations.   Signs and Symptoms:  -Group instruction provided by written material and verbal discussion to support subject matter. Warning signs and symptoms of infection, stroke, and heart attack are reviewed and when to call the physician/911 reinforced. Tips for preventing the spread of infection discussed.   Advanced Directives:  -Group instruction provided by verbal instruction and written material to support subject matter. Instructor reviews Advanced Directive laws and proper instruction for filling out document.   Pulmonary Video:  -Group video education that reviews the importance of medication and oxygen compliance, exercise, good nutrition, pulmonary hygiene, and pursed lip and diaphragmatic breathing for the pulmonary patient.   Exercise for the Pulmonary Patient:  -Group instruction that is supported by a PowerPoint presentation. Instructor discusses benefits of exercise, core components of exercise, frequency, duration, and intensity of an exercise routine,  importance of utilizing pulse oximetry during exercise, safety while exercising, and options of places to exercise outside of rehab.     Pulmonary Medications:  -Verbally interactive group education provided by instructor with focus on inhaled medications and proper administration.   Anatomy and Physiology of the Respiratory System and Intimacy:  -Group instruction provided by PowerPoint, verbal discussion, and written material to support subject matter. Instructor reviews respiratory cycle and anatomical components of the respiratory system and their functions. Instructor also reviews differences in obstructive and restrictive respiratory diseases with examples of each. Intimacy, Sex, and Sexuality differences are reviewed with a discussion on how relationships can change when diagnosed with pulmonary disease. Common sexual concerns are reviewed.   MD DAY -A group question and answer session with a medical doctor  that allows participants to ask questions that relate to their pulmonary disease state.   OTHER EDUCATION -Group or individual verbal, written, or video instructions that support the educational goals of the pulmonary rehab program.   Holiday Eating Survival Tips:  -Group instruction provided by PowerPoint slides, verbal discussion, and written materials to support subject matter. The instructor gives patients tips, tricks, and techniques to help them not only survive but enjoy the holidays despite the onslaught of food that accompanies the holidays.   Knowledge Questionnaire Score: Knowledge Questionnaire Score - 12/04/18 1507      Knowledge Questionnaire Score   Pre Score  17.5       Core Components/Risk Factors/Patient Goals at Admission: Personal Goals and Risk Factors at Admission - 12/04/18 1511      Core Components/Risk Factors/Patient Goals on Admission   Improve shortness of breath with ADL's  Yes    Intervention  Provide education, individualized exercise plan  and daily activity instruction to help decrease symptoms of SOB with activities of daily living.    Expected Outcomes  Short Term: Improve cardiorespiratory fitness to achieve a reduction of symptoms when performing ADLs;Long Term: Be able to perform more ADLs without symptoms or delay the onset of symptoms    Hypertension  Yes    Intervention  Provide education on lifestyle modifcations including regular physical activity/exercise, weight management, moderate sodium restriction and increased consumption of fresh fruit, vegetables, and low fat dairy, alcohol moderation, and smoking cessation.;Monitor prescription use compliance.    Expected Outcomes  Short Term: Continued assessment and intervention until BP is < 140/55mm HG in hypertensive participants. < 130/7mm HG in hypertensive participants with diabetes, heart failure or chronic kidney disease.;Long Term: Maintenance of blood pressure at goal levels.       Core Components/Risk Factors/Patient Goals Review:  Goals and Risk Factor Review    Row Name 12/18/18 1620             Core Components/Risk Factors/Patient Goals Review   Personal Goals Review  Improve shortness of breath with ADL's;Increase knowledge of respiratory medications and ability to use respiratory devices properly.;Develop more efficient breathing techniques such as purse lipped breathing and diaphragmatic breathing and practicing self-pacing with activity.;Hypertension       Review  pt has not begun exercise prior to departmental closure. Will look to monitor pt progress upon departmental reopening.       Expected Outcomes  see admission goals and outcomes.          Core Components/Risk Factors/Patient Goals at Discharge (Final Review):  Goals and Risk Factor Review - 12/18/18 1620      Core Components/Risk Factors/Patient Goals Review   Personal Goals Review  Improve shortness of breath with ADL's;Increase knowledge of respiratory medications and ability to use  respiratory devices properly.;Develop more efficient breathing techniques such as purse lipped breathing and diaphragmatic breathing and practicing self-pacing with activity.;Hypertension    Review  pt has not begun exercise prior to departmental closure. Will look to monitor pt progress upon departmental reopening.    Expected Outcomes  see admission goals and outcomes.       ITP Comments: ITP Comments    Row Name 12/04/18 1443 12/18/18 1618         ITP Comments  Dr. Manfred Arch Medical Director Pulmonary Rehab  Dr. Manfred Arch Medical Director Pulmonary Rehab         Comments: ITP REVIEW Pt has not started exercise prior to departmental closure. Will monitor pt  for progress with departmental reopening.  Joycelyn Man RN, BSN Cardiac and Pulmonary Rehab RN

## 2018-12-19 ENCOUNTER — Ambulatory Visit (HOSPITAL_COMMUNITY): Payer: 59

## 2018-12-21 ENCOUNTER — Ambulatory Visit (HOSPITAL_COMMUNITY): Payer: 59

## 2018-12-25 ENCOUNTER — Telehealth: Payer: Self-pay

## 2018-12-25 NOTE — Telephone Encounter (Signed)
Spoke with patient he states he has a blood pressure cuff but is unsure if he has a scale. He stated he has a smart phone and is willing to complete a Evisit. Informed patient that I would contact him 15 minutes prior to his appt time to review vitals and help him with connecting to the Evisit. Patient voiced understanding.

## 2018-12-26 ENCOUNTER — Ambulatory Visit (HOSPITAL_COMMUNITY): Payer: 59

## 2018-12-26 ENCOUNTER — Other Ambulatory Visit: Payer: Self-pay

## 2018-12-26 NOTE — Telephone Encounter (Signed)
Evisit instructions sent via mychart.

## 2018-12-27 ENCOUNTER — Telehealth (HOSPITAL_COMMUNITY): Payer: Self-pay | Admitting: *Deleted

## 2018-12-27 NOTE — Telephone Encounter (Signed)

## 2018-12-27 NOTE — Telephone Encounter (Signed)
Returned call from message left on 12/26/18.  Left message for pt confirming that we are closed due to Covid-19.  Once we have reopened and are seeing patients, he will be contacted to start pulmonary rehab. (pt has already completed orientation, walk test and his ITP has been signed by Medical Director -Dr. Nelda Marseille. Cherre Huger, BSN Cardiac and Training and development officer

## 2018-12-27 NOTE — Telephone Encounter (Signed)
THIS WILL BE A VIDEO VISIT.   Virtual Visit Pre-Appointment Phone Call  Steps For Call:  1. Confirm consent - "In the setting of the current Covid19 crisis, you are scheduled for a VIDEO visit with your provider on 12/28/2018 at 1:30PM.  Just as we do with many in-office visits, in order for you to participate in this visit, we must obtain consent.  If you'd like, I can send this to your mychart (if signed up) or email for you to review.  Otherwise, I can obtain your verbal consent now.  All virtual visits are billed to your insurance company just like a normal visit would be.  By agreeing to a virtual visit, we'd like you to understand that the technology does not allow for your provider to perform an examination, and thus may limit your provider's ability to fully assess your condition.  Finally, though the technology is pretty good, we cannot assure that it will always work on either your or our end, and in the setting of a video visit, we may have to convert it to a phone-only visit.  In either situation, we cannot ensure that we have a secure connection.  Are you willing to proceed?"  2. Give patient instructions for WebEx download to smartphone as below if video visit  3. Advise patient to be prepared with any vital sign or heart rhythm information, their current medicines, and a piece of paper and pen handy for any instructions they may receive the day of their visit  4. Inform patient they will receive a phone call 15 minutes prior to their appointment time (may be from unknown caller ID) so they should be prepared to answer  5. Confirm that appointment type is correct in Epic appointment notes (video vs telephone)    TELEPHONE CALL NOTE  James Stokes has been deemed a candidate for a follow-up tele-health visit to limit community exposure during the Covid-19 pandemic. I spoke with the patient via phone to ensure availability of phone/video source, confirm preferred email &  phone number, and discuss instructions and expectations.  I reminded Levaughn Puccinelli to be prepared with any vital sign and/or heart rhythm information that could potentially be obtained via home monitoring, at the time of his visit. I reminded Alexzander Dolinger to expect a phone call at the time of his visit if his visit.  Did the patient verbally acknowledge consent to treatment? YES  Harold Hedge, CMA 12/27/2018 5:16 PM   DOWNLOADING THE [place], go to [employer] and type in WebEx in the search bar. [place] [employer], the blue/green circle. The app is free but as with any other app downloads, their phone may require them to verify saved payment information or Apple password. The patient does NOT have to create an account.  - If Android, ask patient to go to [employer] and type in WebEx in the search bar. [place] [employer], the blue/green circle. The app is free but as with any other app downloads, their phone may require them to verify saved payment information or Android password. The patient does NOT have to create an account.   CONSENT FOR TELE-HEALTH VISIT - PLEASE REVIEW  I hereby voluntarily request, consent and authorize [facility] and its employed or contracted physicians, physician assistants, nurse practitioners or other licensed health care professionals (the Practitioner), to provide me with telemedicine health care services (the "  Services") as deemed necessary by the treating Practitioner. I acknowledge and consent to receive the Services by the Practitioner via telemedicine. I understand that the telemedicine visit will involve communicating with the Practitioner through live audiovisual communication technology and the disclosure of certain medical information by electronic transmission. I acknowledge that I have been given the opportunity to request an in-person assessment or other available alternative  prior to the telemedicine visit and am voluntarily participating in the telemedicine visit.  I understand that I have the right to withhold or withdraw my consent to the use of telemedicine in the course of my care at any time, without affecting my right to future care or treatment, and that the Practitioner or I may terminate the telemedicine visit at any time. I understand that I have the right to inspect all information obtained and/or recorded in the course of the telemedicine visit and may receive copies of available information for a reasonable fee.  I understand that some of the potential risks of receiving the Services via telemedicine include:  Marland Kitchen Delay or interruption in medical evaluation due to technological equipment failure or disruption; . Information transmitted may not be sufficient (e.g. poor resolution of images) to allow for appropriate medical decision making by the Practitioner; and/or  . In rare instances, security protocols could fail, causing a breach of personal health information.  Furthermore, I acknowledge that it is my responsibility to provide information about my medical history, conditions and care that is complete and accurate to the best of my ability. I acknowledge that Practitioner's advice, recommendations, and/or decision may be based on factors not within their control, such as incomplete or inaccurate data provided by me or distortions of diagnostic images or specimens that may result from electronic transmissions. I understand that the practice of medicine is not an exact science and that Practitioner makes no warranties or guarantees regarding treatment outcomes. I acknowledge that I will receive a copy of this consent concurrently upon execution via email to the email address I last provided but may also request a printed copy by calling the office of [place].    I understand that my insurance will be billed for this visit.   I have read or had this  consent read to me. . I understand the contents of this consent, which adequately explains the benefits and risks of the Services being provided via telemedicine.  . I have been provided ample opportunity to ask questions regarding this consent and the Services and have had my questions answered to my satisfaction. . I give my informed consent for the services to be provided through the use of telemedicine in my medical care  By participating in this telemedicine visit I agree to the above.

## 2018-12-28 ENCOUNTER — Other Ambulatory Visit: Payer: Self-pay

## 2018-12-28 ENCOUNTER — Ambulatory Visit (HOSPITAL_COMMUNITY): Payer: 59

## 2018-12-28 ENCOUNTER — Telehealth (INDEPENDENT_AMBULATORY_CARE_PROVIDER_SITE_OTHER): Payer: 59 | Admitting: Cardiology

## 2018-12-28 ENCOUNTER — Encounter: Payer: Self-pay | Admitting: Cardiology

## 2018-12-28 VITALS — BP 169/101 | Ht 71.0 in | Wt 185.0 lb

## 2018-12-28 DIAGNOSIS — R0902 Hypoxemia: Secondary | ICD-10-CM

## 2018-12-28 DIAGNOSIS — I7090 Unspecified atherosclerosis: Secondary | ICD-10-CM | POA: Diagnosis not present

## 2018-12-28 DIAGNOSIS — D751 Secondary polycythemia: Secondary | ICD-10-CM

## 2018-12-28 DIAGNOSIS — I50810 Right heart failure, unspecified: Secondary | ICD-10-CM | POA: Insufficient documentation

## 2018-12-28 DIAGNOSIS — J449 Chronic obstructive pulmonary disease, unspecified: Secondary | ICD-10-CM

## 2018-12-28 DIAGNOSIS — I50812 Chronic right heart failure: Secondary | ICD-10-CM

## 2018-12-28 DIAGNOSIS — I10 Essential (primary) hypertension: Secondary | ICD-10-CM

## 2018-12-28 MED ORDER — NICOTINE 14 MG/24HR TD PT24: 14.0000 mg | MEDICATED_PATCH | Freq: Every day | TRANSDERMAL | 0 refills | Status: DC

## 2018-12-28 MED ORDER — NICOTINE 7 MG/24HR TD PT24: 7.0000 mg | MEDICATED_PATCH | Freq: Every day | TRANSDERMAL | 0 refills | Status: DC

## 2018-12-28 NOTE — Progress Notes (Signed)
Virtual Visit via Video Note    Evaluation Performed:  Follow-up visit  This visit type was conducted due to national recommendations for restrictions regarding the COVID-19 Pandemic (e.g. social distancing).  This format is felt to be most appropriate for this patient at this time.  All issues noted in this document were discussed and addressed.  No physical exam was performed (except for noted visual exam findings with Video Visits).  Please refer to the patient's chart (MyChart message for video visits and phone note for telephone visits) for the patient's consent to telehealth for [facility].  Date:  12/28/2018   ID:  James Stokes, DOB 03/02/1956, MRN [id number]  Patient Location: Home [place] [address]   Provider location:   [place]  PCP:  James Bacon, NP  Cardiologist:  James Burow, MD  Electrophysiologist:  None   Chief Complaint:  3 month follow up  History of Present Illness:    James Stokes is a 63 y.o. male who presents via audio/video conferencing for a telehealth visit today.  He has a history of COPD and a prior 2 pack a day smoker.  He also had a history of daily alcohol use, 3-5 beers a day.  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  The patient was admitted in November 2019 from the office.  He was lethargic and significantly hypoxic.  Echocardiogram revealed preserved LV function with an ejection fraction of 60 to 65% with no regional wall motion abnormalities.  He had grade 1 diastolic dysfunction.  The right atrium was mild to moderately dilated, the right ventricle was mildly dilated and his RV function was mildly reduced.  He was felt to have combined COPD and right heart congestive heart failure and respiratory failure.  He diuresed 10 L in the hospital.  He was placed on inhalers and steroids.  He was followed up by pulmonary as an outpatient, he was discharged at that time on home oxygen.  He is done well  since then.  He is now on oxygen only at night.  He has not smoked since that admission.  He denies any lower extremity edema.  He denies any increasing shortness of breath with exertion.  He remains on a NicoDerm 21 mg patch, I felt we could start to taper that down.  He feels like he has gotten over most of the psychological withdrawal from smoking.  The patient does not symptoms concerning for COVID-19 infection (fever, chills, cough, or new SHORTNESS OF BREATH).    Prior CV studies:   The following studies were reviewed today:  Past Medical History:  Diagnosis Date  . Acute diastolic CHF (congestive heart failure) ([place]) 08/22/2018  . Atherosclerotic plaque on cardiac vessels, seen on CT chest 08/22/2018  . Dyslipidemia   . ETOH abuse   . HTN (hypertension)   . Hypertriglyceridemia   . Kidney stones   . Smoker    Past Surgical History:  Procedure Laterality Date  . HERNIA REPAIR     pt was 68 month old  . HERNIA REPAIR     double hernia pt unsure of exact date, DR. Ballen did surgery  . lipoma  05/1989   fatty tumor on back     Current Meds  Medication Sig  . aspirin 81 MG tablet Take 81 mg by mouth every morning.   . diltiazem (CARDIZEM CD) 300 MG 24 hr capsule Take 1 capsule (300 mg total) by  mouth daily.  . fluticasone furoate-vilanterol (BREO ELLIPTA) 100-25 MCG/INH AEPB Inhale 1 puff into the lungs daily.  Marland Kitchen losartan (COZAAR) 50 MG tablet Take 1 tablet (50 mg total) by mouth daily.  . Multiple Vitamins-Minerals (MULTIVITAMIN ADULT PO) Take 1 tablet by mouth every morning.   . nicotine (NICODERM CQ - DOSED IN MG/24 HOURS) 21 mg/24hr patch Place 1 patch (21 mg total) onto the skin daily.  Marland Kitchen umeclidinium bromide (INCRUSE ELLIPTA) 62.5 MCG/INH AEPB Inhale 1 puff into the lungs daily.     Allergies:   Other and Shrimp [shellfish allergy]   Social History   Tobacco Use  . Smoking status: Former Smoker    Packs/day: 2.00    Years: 43.00    Pack years: 86.00    Types:  Cigarettes    Last attempt to quit: 08/17/2018    Years since quitting: 0.3  . Smokeless tobacco: Never Used  Substance Use Topics  . Alcohol use: Yes    Alcohol/week: 15.0 standard drinks    Types: 15 Cans of beer per week    Comment: "6-10 beers a night"  . Drug use: Yes    Types: Marijuana     Family Hx: The patient's family history includes COPD in his mother; Cancer in his father; Diabetes in his paternal aunt and paternal uncle; Hyperlipidemia in his father; Hypertension in his father; Vascular Disease (age of onset: 71) in his father.  ROS:   Please see the history of present illness.    All other systems reviewed and are negative.   Labs/Other Tests and Data Reviewed:    Recent Labs: 08/17/2018: B Natriuretic Peptide 131.5; TSH 2.502 08/22/2018: ALT 49; Magnesium 2.2 08/28/2018: BUN 40; Creatinine, Ser 1.21; Potassium 4.5; Sodium 139 10/04/2018: Hemoglobin 16.3; Platelets 182.0   Recent Lipid Panel Lab Results  Component Value Date/Time   CHOL 235 (H) 03/24/2018 09:39 AM   TRIG 423 (H) 03/24/2018 09:39 AM   HDL 45 03/24/2018 09:39 AM   CHOLHDL 5.2 (H) 03/24/2018 09:39 AM   CHOLHDL 6.8 (H) 01/25/2017 09:01 AM   LDLCALC Comment 03/24/2018 09:39 AM   LDLDIRECT 75 01/25/2017 09:01 AM    Wt Readings from Last 3 Encounters:  12/28/18 185 lb (83.9 kg)  12/04/18 194 lb 14.2 oz (88.4 kg)  11/20/18 180 lb (81.6 kg)     Exam:    Vital Signs:  BP (!) 169/101   Ht 5\' 11"  (1.803 m)   Wt 185 lb (83.9 kg)   BMI 25.80 kg/m    Well nourished, well developed male in no acute distress. Not on O2- not SOB with conversation, no JVD noted.   ASSESSMENT & PLAN:    Acute on chronic respiratory failure with hypoxia ([place]) Admitted from the office 08/17/18- improved after 10L diuresis (Rt heart failure) and treatment of his COPD  Atherosclerotic plaque on cardiac vessels, seen on CT chest Incidental finding- no history of angina  COPD with hypoxia ([place]) Now on nighttime   O2  Essential hypertension High but not usual for him, mild LVH on echo. F/U B/P as an OP  Polycythemia Phlebotomized once- he tells me recent Hgb stable-15 or less  COVID-19 Education: The signs and symptoms of COVID-19 were discussed with the patient and how to seek care for testing (follow up with PCP or arrange E-visit).  The importance of social distancing was discussed today.  Patient Risk:   After full review of this patients clinical status, I feel that they are at least  moderate risk at this time.  Time:   Today, I have spent 25 minutes with the patient with telehealth technology discussing Rt heart failure, smoking.     Medication Adjustments/Labs and Tests Ordered: Current medicines are reviewed at length with the patient today.  Concerns regarding medicines are outlined above.  Tests Ordered: No orders of the defined types were placed in this encounter.  Medication Changes: Taper off Nicoderm patch slowly  Disposition:  F/U with me in 6 months- labs then  Signed, Kerin Ransom, PA-C  12/28/2018 1:52 PM    [facility]

## 2018-12-28 NOTE — Patient Instructions (Signed)
Medication Instructions:  New Prescription of Nicoderm patches sent to pharmacy;  CONTINUE taking what you have THEN start the 14mg  patch once a day for 4 weeks THEN take 7mg  patch daily for 4 weeks If you need a refill on your cardiac medications before your next appointment, please call your pharmacy.   Lab work: None  If you have labs (blood work) drawn today and your tests are completely normal, you will receive your results only by: Marland Kitchen MyChart Message (if you have MyChart) OR . A paper copy in the mail If you have any lab test that is abnormal or we need to change your treatment, we will call you to review the results.  Testing/Procedures: None   Follow-Up: At [facility], you and your health needs are our priority.  As part of our continuing mission to provide you with exceptional heart care, we have created designated Provider Care Teams.  These Care Teams include your primary Cardiologist (physician) and Advanced Practice Providers (APPs -  Physician Assistants and Nurse Practitioners) who all work together to provide you with the care you need, when you need it.  . Your physician recommends that you schedule a follow-up appointment in: 3 months with Kerin Ransom, PA-C.(The schedule is not open this far and our schedulers will contact you to schedule an appointment at a later date)  Any Other Special Instructions Will Be Listed Below (If Applicable).

## 2019-01-02 ENCOUNTER — Ambulatory Visit (HOSPITAL_COMMUNITY): Payer: 59

## 2019-01-04 ENCOUNTER — Ambulatory Visit (HOSPITAL_COMMUNITY): Payer: 59

## 2019-01-09 ENCOUNTER — Telehealth (HOSPITAL_COMMUNITY): Payer: Self-pay | Admitting: *Deleted

## 2019-01-09 ENCOUNTER — Ambulatory Visit (HOSPITAL_COMMUNITY): Payer: 59

## 2019-01-11 ENCOUNTER — Ambulatory Visit: Payer: 59 | Admitting: Internal Medicine

## 2019-01-11 ENCOUNTER — Ambulatory Visit (HOSPITAL_COMMUNITY): Payer: 59

## 2019-01-15 ENCOUNTER — Telehealth: Payer: Self-pay

## 2019-01-15 NOTE — Telephone Encounter (Signed)
Received notification that patient has not reviewed mychart message that was sent to him earlier this month. Patient stated that is correct he forgot to check him messages. Reviewed Luke's recommendations with patient and he voiced understanding.

## 2019-01-16 ENCOUNTER — Ambulatory Visit (HOSPITAL_COMMUNITY): Payer: 59

## 2019-01-18 ENCOUNTER — Ambulatory Visit (HOSPITAL_COMMUNITY): Payer: 59

## 2019-01-19 ENCOUNTER — Encounter (HOSPITAL_BASED_OUTPATIENT_CLINIC_OR_DEPARTMENT_OTHER): Payer: 59

## 2019-01-23 ENCOUNTER — Ambulatory Visit (HOSPITAL_COMMUNITY): Payer: 59

## 2019-01-25 ENCOUNTER — Ambulatory Visit (HOSPITAL_COMMUNITY): Payer: 59

## 2019-01-30 ENCOUNTER — Ambulatory Visit (HOSPITAL_COMMUNITY): Payer: 59

## 2019-02-01 ENCOUNTER — Ambulatory Visit (HOSPITAL_COMMUNITY): Payer: 59

## 2019-02-06 ENCOUNTER — Ambulatory Visit (HOSPITAL_COMMUNITY): Payer: 59

## 2019-02-07 ENCOUNTER — Telehealth (HOSPITAL_COMMUNITY): Payer: Self-pay | Admitting: *Deleted

## 2019-02-08 ENCOUNTER — Ambulatory Visit (HOSPITAL_COMMUNITY): Payer: 59

## 2019-02-13 ENCOUNTER — Encounter (HOSPITAL_BASED_OUTPATIENT_CLINIC_OR_DEPARTMENT_OTHER): Payer: 59

## 2019-02-13 ENCOUNTER — Ambulatory Visit (HOSPITAL_COMMUNITY): Payer: 59

## 2019-02-15 ENCOUNTER — Ambulatory Visit (HOSPITAL_COMMUNITY): Payer: 59

## 2019-02-20 ENCOUNTER — Ambulatory Visit (HOSPITAL_COMMUNITY): Payer: 59

## 2019-02-21 ENCOUNTER — Telehealth: Payer: Self-pay | Admitting: *Deleted

## 2019-02-21 NOTE — Telephone Encounter (Signed)
A message was left,re:follow visit. 

## 2019-02-22 ENCOUNTER — Ambulatory Visit (HOSPITAL_COMMUNITY): Payer: 59

## 2019-02-27 ENCOUNTER — Ambulatory Visit (HOSPITAL_COMMUNITY): Payer: 59

## 2019-03-01 ENCOUNTER — Ambulatory Visit (HOSPITAL_COMMUNITY): Payer: 59

## 2019-03-06 ENCOUNTER — Ambulatory Visit (HOSPITAL_COMMUNITY): Payer: 59

## 2019-03-08 ENCOUNTER — Ambulatory Visit (HOSPITAL_COMMUNITY): Payer: 59

## 2019-03-12 ENCOUNTER — Other Ambulatory Visit (HOSPITAL_COMMUNITY)
Admission: RE | Admit: 2019-03-12 | Discharge: 2019-03-12 | Disposition: A | Discharge status: Home / Self Care | Payer: 59 | Source: Ambulatory Visit | Attending: Pulmonary Disease | Admitting: Pulmonary Disease

## 2019-03-12 DIAGNOSIS — Z1159 Encounter for screening for other viral diseases: Secondary | ICD-10-CM | POA: Insufficient documentation

## 2019-03-13 ENCOUNTER — Ambulatory Visit (HOSPITAL_COMMUNITY): Payer: 59

## 2019-03-13 LAB — NOVEL CORONAVIRUS, NAA (HOSP ORDER, SEND-OUT TO REF LAB; TAT 18-24 HRS): SARS-CoV-2, NAA: NOT DETECTED

## 2019-03-15 ENCOUNTER — Other Ambulatory Visit: Payer: Self-pay

## 2019-03-15 ENCOUNTER — Ambulatory Visit (HOSPITAL_COMMUNITY): Payer: 59

## 2019-03-15 ENCOUNTER — Ambulatory Visit (HOSPITAL_BASED_OUTPATIENT_CLINIC_OR_DEPARTMENT_OTHER): Payer: 59 | Attending: Pulmonary Disease | Admitting: Pulmonary Disease

## 2019-03-15 DIAGNOSIS — R0902 Hypoxemia: Secondary | ICD-10-CM | POA: Insufficient documentation

## 2019-03-15 DIAGNOSIS — D751 Secondary polycythemia: Secondary | ICD-10-CM | POA: Insufficient documentation

## 2019-03-15 DIAGNOSIS — G4733 Obstructive sleep apnea (adult) (pediatric): Secondary | ICD-10-CM | POA: Insufficient documentation

## 2019-03-15 DIAGNOSIS — J449 Chronic obstructive pulmonary disease, unspecified: Secondary | ICD-10-CM | POA: Diagnosis not present

## 2019-03-15 DIAGNOSIS — J9611 Chronic respiratory failure with hypoxia: Secondary | ICD-10-CM

## 2019-03-16 ENCOUNTER — Other Ambulatory Visit: Payer: Self-pay

## 2019-03-16 DIAGNOSIS — J9611 Chronic respiratory failure with hypoxia: Secondary | ICD-10-CM

## 2019-03-16 NOTE — Procedures (Signed)
    Patient Name: James Stokes, James Stokes Date: 03/15/2019 Gender: Male D.O.B: Oct 22, 1955 Age (years): 29 Referring Provider: Chesley Mires MD, ABSM Height (inches): 71 Interpreting Physician: Chesley Mires MD, ABSM Weight (lbs): 190 RPSGT: Zadie Rhine BMI: 26 MRN: [id number] Neck Size: 17.00  CLINICAL INFORMATION 63 year old male with severe COPD and polycythemia found to have obstructive sleep apnea.  Presents for CPAP titration study.  Date of NPSG 11/20/2018: AHI 11.2.   SLEEP STUDY TECHNIQUE As per the AASM Manual for the Scoring of Sleep and Associated Events v2.3 (April 2016) with a hypopnea requiring 4% desaturations.  The channels recorded and monitored were frontal, central and occipital EEG, electrooculogram (EOG), submentalis EMG (chin), nasal and oral airflow, thoracic and abdominal wall motion, anterior tibialis EMG, snore microphone, electrocardiogram, and pulse oximetry. Continuous positive airway pressure (CPAP) was initiated at the beginning of the study and titrated to treat sleep-disordered breathing.  MEDICATIONS Medications self-administered by patient taken the night of the study : N/A  TECHNICIAN COMMENTS Comments added by technician: NO RESTROOM VISTED. Patient had difficulty initiating sleep. Comments added by scorer: N/A  RESPIRATORY PARAMETERS Optimal PAP Pressure (cm): 8 AHI at Optimal Pressure (/hr): 1.2 Overall Minimal O2 (%): 85.0 Supine % at Optimal Pressure (%): 33 Minimal O2 at Optimal Pressure (%): 85.0    He continued to have oxygen desaturation lasting for more than 5 minutes in spite of having good control of obstructive events with CPAP.  This improved with the addition of 1 liter oxygen with CPAP.  SLEEP ARCHITECTURE The study was initiated at 10:42:46 PM and ended at 5:35:26 AM.  Sleep onset time was 33.5 minutes and the sleep efficiency was 86.3%%. The total sleep time was 356 minutes.  The patient spent 2.4%% of the night in stage  N1 sleep, 55.1%% in stage N2 sleep, 0.4%% in stage N3 and 42.1% in REM.Stage REM latency was 45.0 minutes  Wake after sleep onset was 23.2. Alpha intrusion was absent. Supine sleep was 20.37%.  CARDIAC DATA The 2 lead EKG demonstrated sinus rhythm. The mean heart rate was 67.5 beats per minute. Other EKG findings include: None.  LEG MOVEMENT DATA The total Periodic Limb Movements of Sleep (PLMS) were 0. The PLMS index was 0.0. A PLMS index of <15 is considered normal in adults.  IMPRESSIONS - He did well with CPAP 8 cm H2O. - He required 1 liter oxygen added with CPAP.  DIAGNOSIS - Obstructive sleep apnea-COPD overlap - Nocturnal hypoxemia  RECOMMENDATIONS - Trial of CPAP therapy on 8 cm H2O with a Medium size Fisher&Paykel Full Face Mask Simplus mask and heated humidification. - 1 liter oxygen with CPAP at night.  [Electronically signed] 03/16/2019 05:49 PM  Chesley Mires MD, [place], American Board of Sleep Medicine   NPI: [id number]

## 2019-03-18 ENCOUNTER — Other Ambulatory Visit: Payer: Self-pay | Admitting: Nurse Practitioner

## 2019-04-03 ENCOUNTER — Other Ambulatory Visit: Payer: Self-pay | Admitting: Cardiology

## 2019-04-13 ENCOUNTER — Telehealth (HOSPITAL_COMMUNITY): Payer: Self-pay

## 2019-04-17 ENCOUNTER — Telehealth: Payer: Self-pay | Admitting: Internal Medicine

## 2019-04-17 MED ORDER — UMECLIDINIUM BROMIDE 62.5 MCG/INH IN AEPB: 1.0000 | INHALATION_SPRAY | Freq: Every day | RESPIRATORY_TRACT | 5 refills | Status: DC

## 2019-04-17 NOTE — Telephone Encounter (Signed)
Refill of pt's incruse inhaler has been sent to pt's preferred pharmacy. Called and spoke with pt letting him know this had been done and pt verbalized understanding. Nothing further needed.

## 2019-04-25 ENCOUNTER — Telehealth: Payer: Self-pay | Admitting: *Deleted

## 2019-04-25 NOTE — Telephone Encounter (Signed)
A message was left, re: follow up visit. 

## 2019-05-10 ENCOUNTER — Other Ambulatory Visit: Payer: Self-pay | Admitting: Cardiology

## 2019-06-07 ENCOUNTER — Other Ambulatory Visit: Payer: Self-pay | Admitting: Internal Medicine

## 2019-06-17 ENCOUNTER — Other Ambulatory Visit: Payer: Self-pay | Admitting: Internal Medicine

## 2019-06-28 ENCOUNTER — Telehealth: Payer: Self-pay | Admitting: Cardiology

## 2019-06-28 NOTE — Telephone Encounter (Signed)
LVM for patient to call back and schedule 3 month followup with Lurena Joiner.

## 2019-07-02 ENCOUNTER — Telehealth: Payer: Self-pay | Admitting: *Deleted

## 2019-07-02 NOTE — Telephone Encounter (Signed)
A message was left, re: office visit. 

## 2019-07-05 NOTE — Progress Notes (Signed)
Discharge Progress Report  Patient Details  Name: James Stokes MRN: [id number] Date of Birth: [birth year] Referring Provider:     Pulmonary Rehab Walk Test from 12/05/2018 in [place]  Referring Provider  [place]       Number of Visits: 0  Reason for Discharge:  Early Exit:  due to department closure for COVID-19 precautions and patient did not return when the department reopened  Smoking History:  Social History   Tobacco Use  Smoking Status Former Smoker  . Packs/day: 2.00  . Years: 43.00  . Pack years: 86.00  . Types: Cigarettes  . Quit date: 08/17/2018  . Years since quitting: 0.8  Smokeless Tobacco Never Used    Diagnosis:  Chronic respiratory failure with hypoxia (H[facility])  ADL UCSD:   Initial Exercise Prescription:   Discharge Exercise Prescription (Final Exercise Prescription Changes):   Functional Capacity:   Psychological, QOL, Others - Outcomes: PHQ 2/9: Depression screen [facility] 2/9 12/04/2018 12/31/2015 10/14/2015  Decreased Interest 0 0 0  Down, Depressed, Hopeless 0 0 0  PHQ - 2 Score 0 0 0  Altered sleeping 0 - -  Tired, decreased energy 1 - -  Change in appetite 1 - -  Feeling bad or failure about yourself  0 - -  Trouble concentrating 0 - -  Moving slowly or fidgety/restless 0 - -  Suicidal thoughts 0 - -  PHQ-9 Score 2 - -  Difficult doing work/chores Not difficult at all - -    Quality of Life:   Personal Goals: Goals established at orientation with interventions provided to work toward goal.    Personal Goals Discharge:   Exercise Goals and Review:   Exercise Goals Re-Evaluation:   Nutrition & Weight - Outcomes:    Nutrition:   Nutrition Discharge:   Education Questionnaire Score:   Goals reviewed with patient; copy given to patient.

## 2019-07-05 NOTE — Addendum Note (Signed)
Encounter addended by: Lance Morin, RN on: 07/05/2019 4:30 PM  Actions taken: Clinical Note Signed, Episode resolved

## 2019-07-19 IMAGING — CT CT CHEST HIGH RESOLUTION W/O CM
2 of 6 series · 15 of 36 positions shown, 18 images · non-contrast
Comparison: No priors.

CLINICAL DATA: 62-year-old male with history of chronic dyspnea and
hypoxemia of unknown cause. Evaluate for interstitial lung disease.

EXAM:
CT CHEST WITHOUT CONTRAST
TECHNIQUE: Multidetector CT imaging of the chest was performed following the
standard protocol without intravenous contrast. High resolution
imaging of the lungs, as well as inspiratory and expiratory imaging,
was performed.

[Series 8: high resolution 2.0 b30f · axial · 0.85mm/px · z∈[-167,+141]mm · 12 of 174 slices shown, 15 images]
[im 10/174  mediastinal]
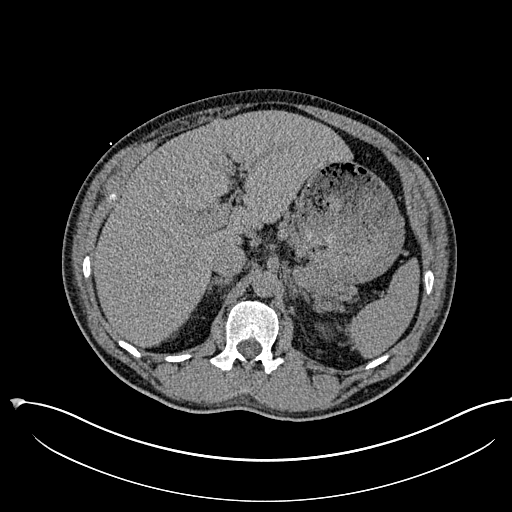
[im 10/174  lung]
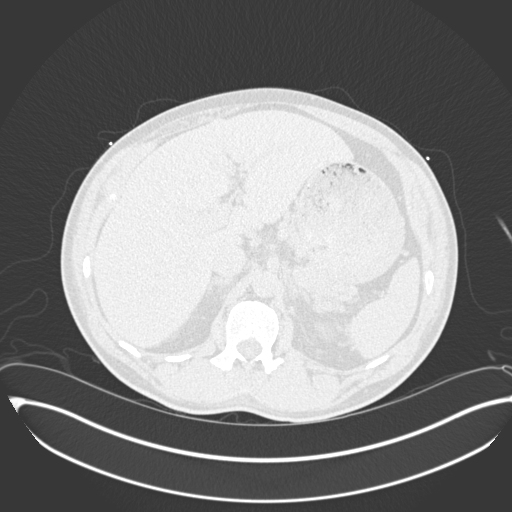
[im 28/174  lung]
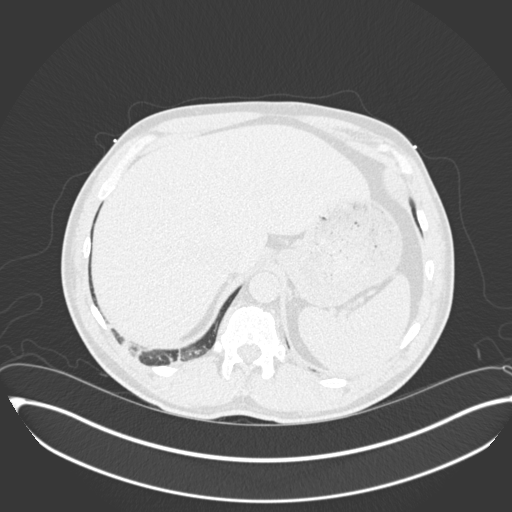
[im 37/174  lung]
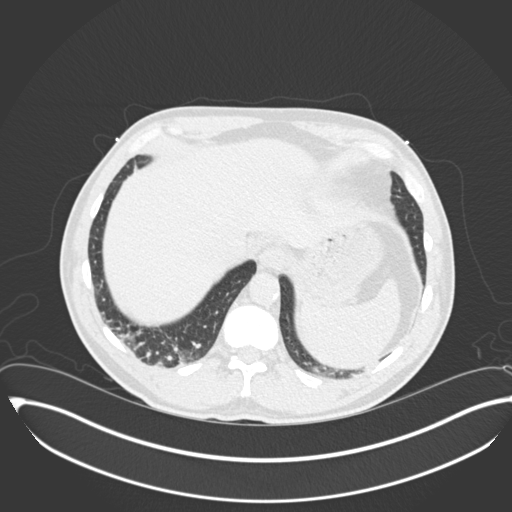
[im 55/174  lung]
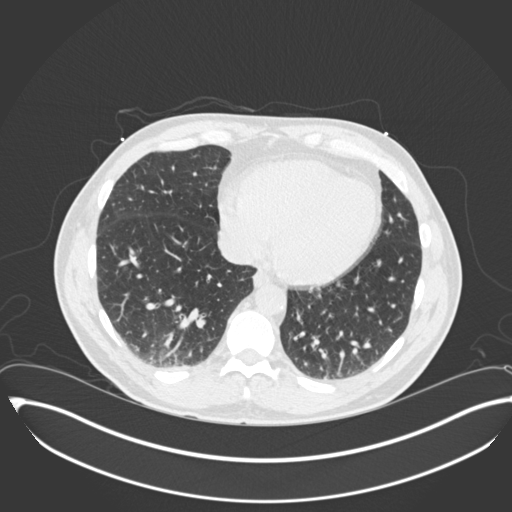
[im 64/174  mediastinal]
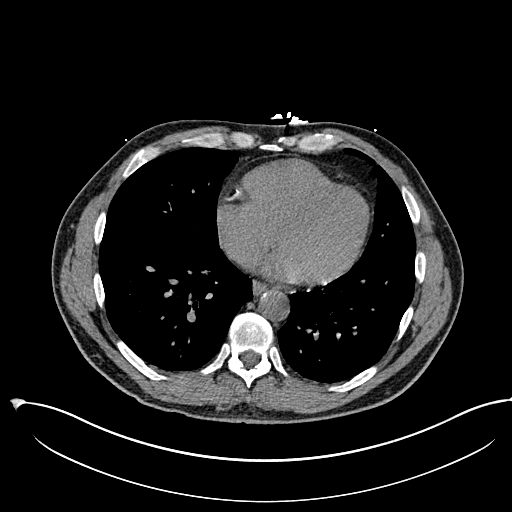
[im 64/174  lung]
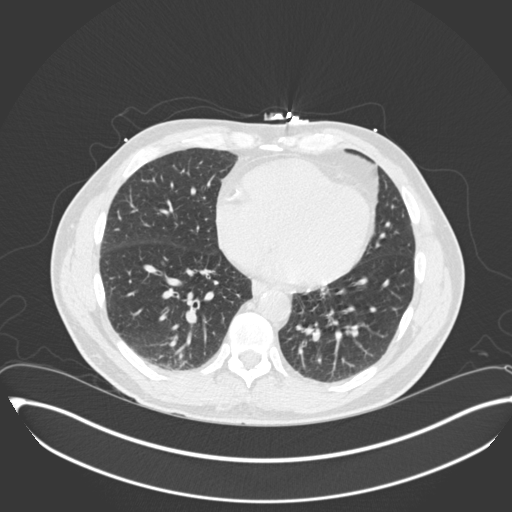
[im 82/174  lung]
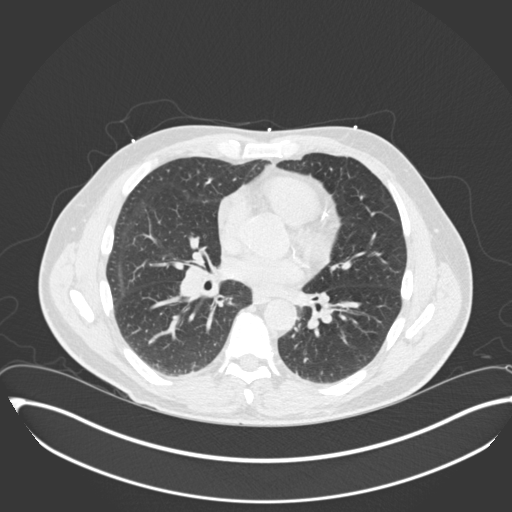
[im 92/174  lung]
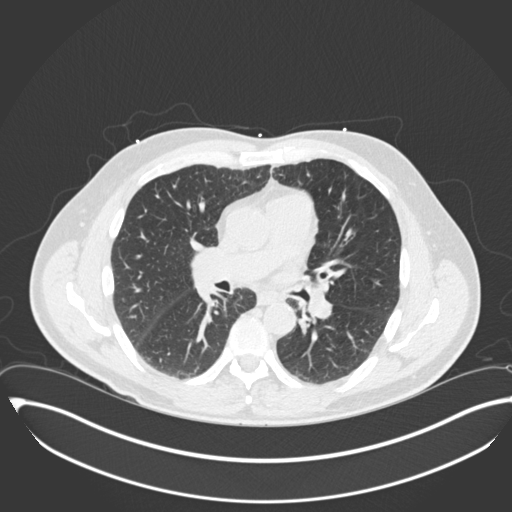
[im 110/174  lung]
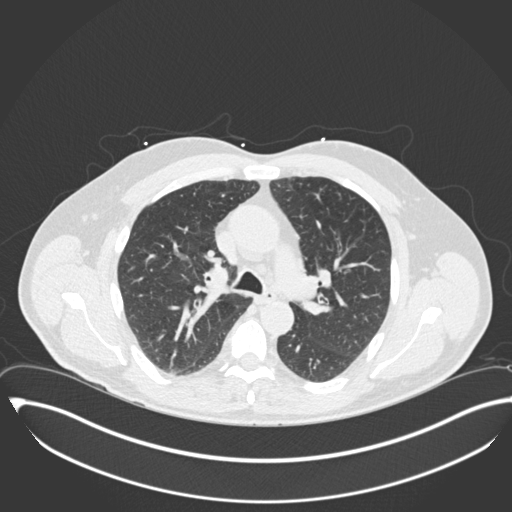
[im 119/174  mediastinal]
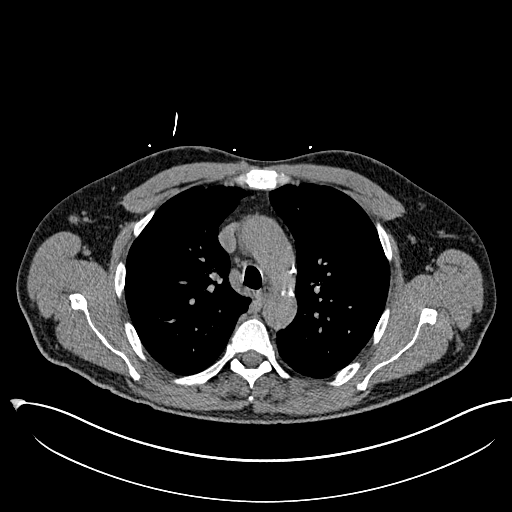
[im 119/174  lung]
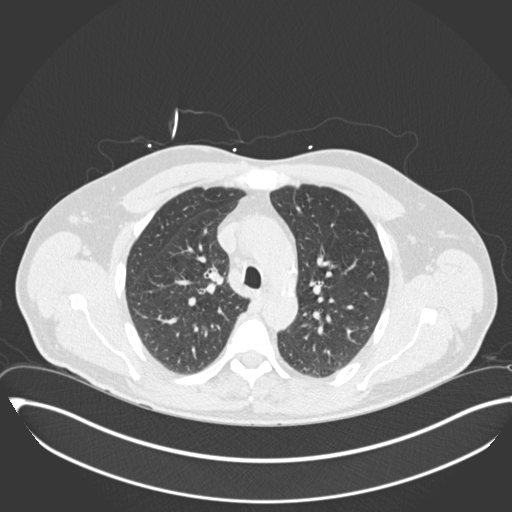
[im 137/174  lung]
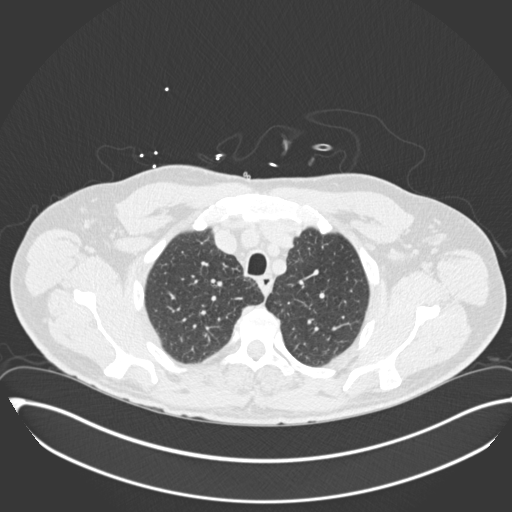
[im 146/174  lung]
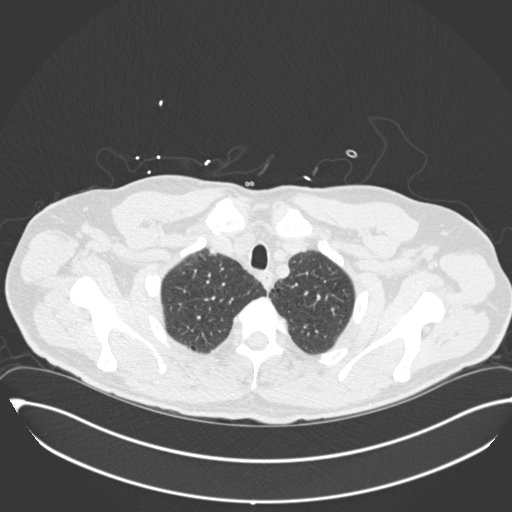
[im 164/174  lung]
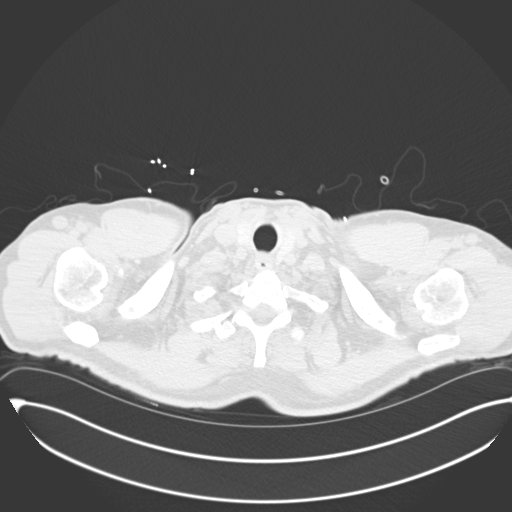

[Series 11: coronal · coronal · 0.71mm/px · 3 of 151 slices shown]
[im 31/151  lung]
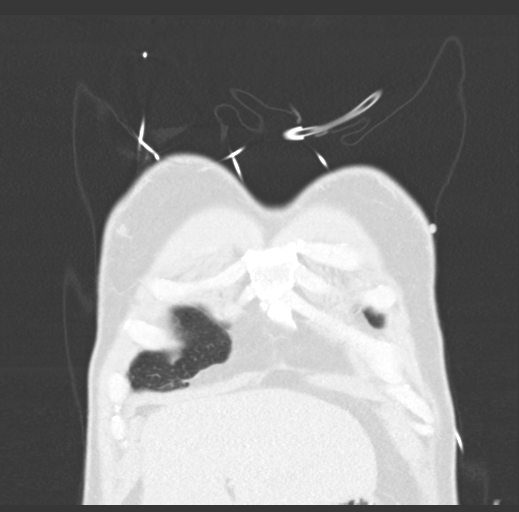
[im 61/151  lung]
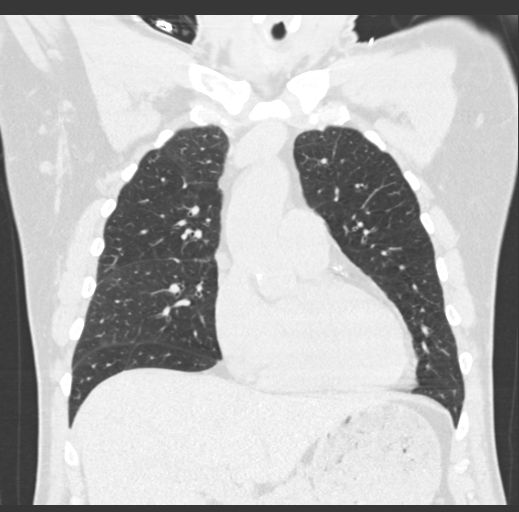
[im 91/151  lung]
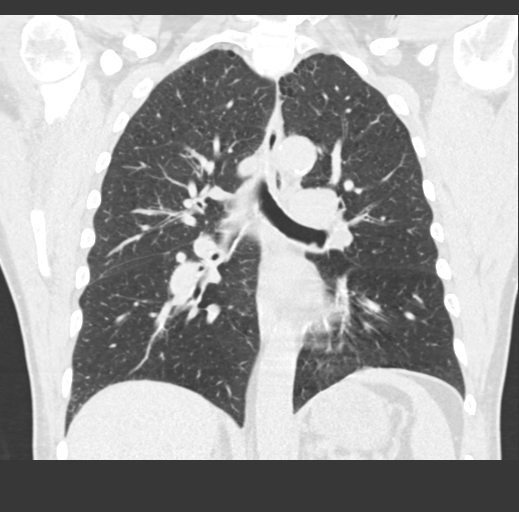

[15 of 36 positions shown; findings below may reference images not displayed]

FINDINGS: Cardiovascular: Heart size is normal. There is no significant
pericardial fluid, thickening or pericardial calcification. There is
aortic atherosclerosis, as well as atherosclerosis of the great
vessels of the mediastinum and the coronary arteries, including
calcified atherosclerotic plaque in the left main, left anterior
descending, left circumflex and right coronary arteries.

Mediastinum/Nodes: No pathologically enlarged mediastinal or hilar
lymph nodes. Please note that accurate exclusion of hilar adenopathy
is limited on noncontrast CT scans. Esophagus is unremarkable in
appearance. No axillary lymphadenopathy.

Lungs/Pleura: There are some areas of dependent ground-glass
attenuation and mild septal thickening in the lower lobes of the
lungs bilaterally which normalize on prone high-resolution imaging,
indicative of benign areas of atelectasis. High-resolution imaging
otherwise demonstrates no significant regions of ground-glass
attenuation, subpleural reticulation, parenchymal banding, traction
bronchiectasis or honeycombing. Inspiratory and expiratory imaging
is unremarkable. Diffuse bronchial wall thickening with mild
centrilobular and paraseptal emphysema. No acute consolidative
airspace disease. No pleural effusions. No suspicious appearing
pulmonary nodules or masses are noted.

Upper Abdomen: Aortic atherosclerosis.

Musculoskeletal: There are no aggressive appearing lytic or blastic
lesions noted in the visualized portions of the skeleton.
IMPRESSION: 1. No evidence of interstitial lung disease.
2. Mild diffuse bronchial wall thickening with mild centrilobular
and paraseptal emphysema; imaging findings suggestive of underlying
COPD.
3. Aortic atherosclerosis, in addition to left main and 3 vessel
coronary artery disease. Please note that although the presence of
coronary artery calcium documents the presence of coronary artery
disease, the severity of this disease and any potential stenosis
cannot be assessed on this non-gated CT examination. Assessment for
potential risk factor modification, dietary therapy or pharmacologic
therapy may be warranted, if clinically indicated.

Aortic Atherosclerosis (FF68Y-NGA.A) and Emphysema (FF68Y-RNC.0).

## 2019-08-02 ENCOUNTER — Other Ambulatory Visit: Payer: Self-pay

## 2019-08-02 ENCOUNTER — Encounter: Payer: Self-pay | Admitting: Cardiology

## 2019-08-02 ENCOUNTER — Ambulatory Visit: Payer: 59 | Admitting: Cardiology

## 2019-08-02 VITALS — BP 150/92 | HR 89 | Temp 97.3°F | Ht 71.0 in | Wt 192.8 lb

## 2019-08-02 DIAGNOSIS — I10 Essential (primary) hypertension: Secondary | ICD-10-CM

## 2019-08-02 DIAGNOSIS — R0902 Hypoxemia: Secondary | ICD-10-CM | POA: Diagnosis not present

## 2019-08-02 DIAGNOSIS — I7090 Unspecified atherosclerosis: Secondary | ICD-10-CM | POA: Diagnosis not present

## 2019-08-02 DIAGNOSIS — J449 Chronic obstructive pulmonary disease, unspecified: Secondary | ICD-10-CM

## 2019-08-02 MED ORDER — HYDROCHLOROTHIAZIDE 12.5 MG PO CAPS: 12.5000 mg | ORAL_CAPSULE | Freq: Every day | ORAL | 3 refills | Status: DC

## 2019-08-02 NOTE — Patient Instructions (Addendum)
Medication Instructions:  START Hydrochlorothiazide 12.5mg  Take 1 tablet one tablet daily every morning  *If you need a refill on your cardiac medications before your next appointment, please call your pharmacy*  Lab Work: Your physician recommends that you return for lab work in: [place] If you have labs (blood work) drawn today and your tests are completely normal, you will receive your results only by: Marland Kitchen MyChart Message (if you have MyChart) OR . A paper copy in the mail If you have any lab test that is abnormal or we need to change your treatment, we will call you to review the results.  Testing/Procedures: NONE   Follow-Up: At [facility], you and your health needs are our priority.  As part of our continuing mission to provide you with exceptional heart care, we have created designated Provider Care Teams.  These Care Teams include your primary Cardiologist (physician) and Advanced Practice Providers (APPs -  Physician Assistants and Nurse Practitioners) who all work together to provide you with the care you need, when you need it.  Your next appointment:   2 WEEKS  The format for your next appointment:   In Person  Provider:   Kerin Ransom, PA-C  Other Instructions

## 2019-08-02 NOTE — Progress Notes (Signed)
Cardiology Office Note:    Date:  08/02/2019   ID:  James Stokes, DOB [birth year], MRN [id number]  PCP:  Levin Bacon, NP  Cardiologist:  [name], MD  Electrophysiologist:  None   Referring MD: Levin Bacon, NP   No chief complaint on file.   History of Present Illness:    James Stokes is a 63 y.o. male with a hx of COPD and a prior 2 pack a day smoker.  He also had a history of daily alcohol use, 3-5 beers a day.  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  The patient was admitted in November 2019 from the office.  He was lethargic and significantly hypoxic.  Echocardiogram revealed preserved LV function with an ejection fraction of 60 to 65% with no regional wall motion abnormalities.  He had grade 1 diastolic dysfunction.  The right atrium was mild to moderately dilated, the right ventricle was mildly dilated and his RV function was mildly reduced.  He was felt to have combined COPD and right heart congestive heart failure and respiratory failure.  He diuresed 10 L in the hospital.  He was placed on inhalers and steroids.  He was followed up by pulmonary as an outpatient, he was discharged at that time on home oxygen.  He is done well since then.  He is now on oxygen only at night.  He has not smoked since that admission.  He is in the office today for routine follow up.  He has noticed his B/P has been elevated at home.  B/P by me was 152/82.   Past Medical History:  Diagnosis Date  . Acute diastolic CHF (congestive heart failure) ([place]) 08/22/2018  . Atherosclerotic plaque on cardiac vessels, seen on CT chest 08/22/2018  . Dyslipidemia   . ETOH abuse   . HTN (hypertension)   . Hypertriglyceridemia   . Kidney stones   . Smoker     Past Surgical History:  Procedure Laterality Date  . HERNIA REPAIR     pt was 59 month old  . HERNIA REPAIR     double hernia pt unsure of exact date, DR. Ballen did surgery  . lipoma  05/1989   fatty tumor on back     Current Medications: Current Meds  Medication Sig  . albuterol (PROVENTIL HFA;VENTOLIN HFA) 108 (90 Base) MCG/ACT inhaler Inhale 2 puffs into the lungs every 6 (six) hours as needed for wheezing or shortness of breath (cough, shortness of breath or wheezing.).  Marland Kitchen aspirin 81 MG tablet Take 81 mg by mouth every morning.   Marland Kitchen BREO ELLIPTA 100-25 MCG/INH AEPB TAKE 1 PUFF BY MOUTH EVERY DAY  . diltiazem (CARDIZEM CD) 300 MG 24 hr capsule Take 1 capsule (300 mg total) by mouth daily.  Marland Kitchen losartan (COZAAR) 50 MG tablet Take 1 tablet (50 mg total) by mouth daily.  . Multiple Vitamins-Minerals (MULTIVITAMIN ADULT PO) Take 1 tablet by mouth every morning.   . umeclidinium bromide (INCRUSE ELLIPTA) 62.5 MCG/INH AEPB Inhale 1 puff into the lungs daily.     Allergies:   Other and Shrimp [shellfish allergy]   Social History   Socioeconomic History  . Marital status: Married    Spouse name: Not on file  . Number of children: Not on file  . Years of education: Not on file  . Highest education level: Not on file  Occupational History  . Not on file  Social Needs  . Financial resource strain: Not  on file  . Food insecurity    Worry: Not on file    Inability: Not on file  . Transportation needs    Medical: Not on file    Non-medical: Not on file  Tobacco Use  . Smoking status: Former Smoker    Packs/day: 2.00    Years: 43.00    Pack years: 86.00    Types: Cigarettes    Quit date: 08/17/2018    Years since quitting: 0.9  . Smokeless tobacco: Never Used  Substance and Sexual Activity  . Alcohol use: Yes    Alcohol/week: 15.0 standard drinks    Types: 15 Cans of beer per week    Comment: "6-10 beers a night"  . Drug use: Yes    Types: Marijuana  . Sexual activity: Not on file  Lifestyle  . Physical activity    Days per week: Not on file    Minutes per session: Not on file  . Stress: Not on file  Relationships  . Social Herbalist on phone: Not on file    Gets  together: Not on file    Attends religious service: Not on file    Active member of club or organization: Not on file    Attends meetings of clubs or organizations: Not on file    Relationship status: Not on file  Other Topics Concern  . Not on file  Social History Narrative  . Not on file     Family History: The patient's family history includes COPD in his mother; Cancer in his father; Diabetes in his paternal aunt and paternal uncle; Hyperlipidemia in his father; Hypertension in his father; Vascular Disease (age of onset: 55) in his father.  ROS:   Please see the history of present illness.     All other systems reviewed and are negative.  EKGs/Labs/Other Studies Reviewed:      EKG:  EKG is ordered today.  The ekg ordered today demonstrates NSR-HR 88  Recent Labs: 08/17/2018: B Natriuretic Peptide 131.5; TSH 2.502 08/22/2018: ALT 49; Magnesium 2.2 08/28/2018: BUN 40; Creatinine, Ser 1.21; Potassium 4.5; Sodium 139 10/04/2018: Hemoglobin 16.3; Platelets 182.0  Recent Lipid Panel    Component Value Date/Time   CHOL 235 (H) 03/24/2018 0939   TRIG 423 (H) 03/24/2018 0939   HDL 45 03/24/2018 0939   CHOLHDL 5.2 (H) 03/24/2018 0939   CHOLHDL 6.8 (H) 01/25/2017 0901   VLDL NOT CALC 01/25/2017 0901   LDLCALC Comment 03/24/2018 0939   LDLDIRECT 75 01/25/2017 0901    Physical Exam:    VS:  BP (!) 150/92   Pulse 89   Temp (!) 97.3 F (36.3 C)   Ht 5\' 11"  (1.803 m)   Wt 192 lb 12.8 oz (87.5 kg)   SpO2 93%   BMI 26.89 kg/m     Wt Readings from Last 3 Encounters:  08/02/19 192 lb 12.8 oz (87.5 kg)  03/15/19 190 lb (86.2 kg)  12/28/18 185 lb (83.9 kg)     GEN: Well nourished, well developed in no acute distress HEENT: Normal NECK: No JVD; No carotid bruits LYMPHATICS: No lymphadenopathy CARDIAC: RRR, no murmurs, rubs, gallops RESPIRATORY:  Clear to auscultation without rales, wheezing or rhonchi  ABDOMEN: Soft, non-tender, non-distended MUSCULOSKELETAL:  No edema;  No deformity  SKIN: Warm and dry NEUROLOGIC:  Alert and oriented x 3 PSYCHIATRIC:  Normal affect   ASSESSMENT:    Essential hypertension High but not usual for him, mild LVH on echo.  Will add HCTZ 12.5 and f/u as OP.  History of chronic respiratory failure with hypoxia ([place]) Admitted from the office 08/17/18- improved after 10L diuresis (Rt heart failure) and treatment of his COPD  Atherosclerotic plaque on cardiac vessels, seen on CT chest Incidental finding- no history of angina  COPD with hypoxia ([place]) Now on nighttime  O2. He is not smoking   PLAN:    Add HCTZ 12.5 mg.  Check BMP and f/u B/P in two weeks.    Medication Adjustments/Labs and Tests Ordered: Current medicines are reviewed at length with the patient today.  Concerns regarding medicines are outlined above.  No orders of the defined types were placed in this encounter.  No orders of the defined types were placed in this encounter.   There are no Patient Instructions on file for this visit.   Signed, Kerin Ransom, PA-C  08/02/2019 2:09 PM     [facility]

## 2019-08-15 ENCOUNTER — Telehealth: Payer: Self-pay

## 2019-08-15 NOTE — Telephone Encounter (Signed)
-----   Message from Erlene Quan, [place] sent at 08/14/2019  1:16 PM EST ----- You can offer this patient a virtual visit if he has been following his B/P at home.  Kerin Ransom PA-C 08/14/2019 1:16 PM

## 2019-08-15 NOTE — Telephone Encounter (Signed)
Contacted patient to see how he is doing and to see if he has been checking his blood pressure daily. He stated he has not because he recently moved and does not know where his cuff is. He states he will start looking for it. I advised patient that if he finds his cuff in the next week to check his bp daily for a week and then contact the office to change his appt from in office to virtual. He voiced understanding.

## 2019-08-22 ENCOUNTER — Telehealth: Payer: Self-pay

## 2019-08-22 NOTE — Telephone Encounter (Signed)
Patient agreeable with virtual appt.       Virtual Visit Pre-Appointment Phone Call  "(Name), I am calling you today to discuss your upcoming appointment. We are currently trying to limit exposure to the virus that causes COVID-19 by seeing patients at home rather than in the office."  1. "What is the BEST phone number to call the day of the visit?" - include this in appointment notes  2. "Do you have or have access to (through a family member/friend) a smartphone with video capability that we can use for your visit?" a. If yes - list this number in appt notes as "cell" (if different from BEST phone #) and list the appointment type as a VIDEO visit in appointment notes b. If no - list the appointment type as a PHONE visit in appointment notes  Confirm consent - "In the setting of the current Covid19 crisis, you are scheduled for a (phone or video) visit with your provider on (date) at (time).  Just as we do with many in-office visits, in order for you to participate in this visit, we must obtain consent.  If you'd like, I can send this to your mychart (if signed up) or email for you to review.  Otherwise, I can obtain your verbal consent now.  All virtual visits are billed to your insurance company just like a normal visit would be.  By agreeing to a virtual visit, we'd like you to understand that the technology does not allow for your provider to perform an examination, and thus may limit your provider's ability to fully assess your condition. If your provider identifies any concerns that need to be evaluated in person, we will make arrangements to do so.  Finally, though the technology is pretty good, we cannot assure that it will always work on either your or our end, and in the setting of a video visit, we may have to convert it to a phone-only visit.  In either situation, we cannot ensure that we have a secure connection.  Are you willing to proceed?" STAFF: Did the patient verbally acknowledge  consent to telehealth visit? Document YES/NO here: YES 3. Advise patient to be prepared - "Two hours prior to your appointment, go ahead and check your blood pressure, pulse, oxygen saturation, and your weight (if you have the equipment to check those) and write them all down. When your visit starts, your provider will ask you for this information. If you have an Apple Watch or Kardia device, please plan to have heart rate information ready on the day of your appointment. Please have a pen and paper handy nearby the day of the visit as well."  4. Give patient instructions for MyChart download to smartphone OR Doximity/Doxy.me as below if video visit (depending on what platform provider is using)  5. Inform patient they will receive a phone call 15 minutes prior to their appointment time (may be from unknown caller ID) so they should be prepared to answer    TELEPHONE CALL NOTE  James Stokes has been deemed a candidate for a follow-up tele-health visit to limit community exposure during the Covid-19 pandemic. I spoke with the patient via phone to ensure availability of phone/video source, confirm preferred email & phone number, and discuss instructions and expectations.  I reminded James Stokes to be prepared with any vital sign and/or heart rhythm information that could potentially be obtained via home monitoring, at the time of his visit. I reminded James Stokes to expect a phone call prior to his visit.  James Stokes, [place] 08/22/2019 9:44 AM   INSTRUCTIONS FOR DOWNLOADING THE MYCHART APP TO SMARTPHONE  - The patient must first make sure to have activated MyChart and know their login information - If Apple, go to [employer] and type in MyChart in the search bar and download the app. If Android, ask patient to go to [employer] and type in [place] in the search bar and download the app. The app is free but as with any other app downloads, their phone may require them  to verify saved payment information or Apple/Android password.  - The patient will need to then log into the app with their MyChart username and password, and select [facility] as their healthcare provider to link the account. When it is time for your visit, go to the MyChart app, find appointments, and click Begin Video Visit. Be sure to Select Allow for your device to access the Microphone and Camera for your visit. You will then be connected, and your provider will be with you shortly.  **If they have any issues connecting, or need assistance please contact MyChart service desk [phone])**  **If using a computer, in order to ensure the best quality for their visit they will need to use either of the following Internet Browsers: [employer], or Google Chrome**  IF USING DOXIMITY or DOXY.ME - The patient will receive a link just prior to their visit by text.     FULL LENGTH CONSENT FOR TELE-HEALTH VISIT   I hereby voluntarily request, consent and authorize [facility] and its employed or contracted physicians, physician assistants, nurse practitioners or other licensed health care professionals (the Practitioner), to provide me with telemedicine health care services (the "Services") as deemed necessary by the treating Practitioner. I acknowledge and consent to receive the Services by the Practitioner via telemedicine. I understand that the telemedicine visit will involve communicating with the Practitioner through live audiovisual communication technology and the disclosure of certain medical information by electronic transmission. I acknowledge that I have been given the opportunity to request an in-person assessment or other available alternative prior to the telemedicine visit and am voluntarily participating in the telemedicine visit.  I understand that I have the right to withhold or withdraw my consent to the use of telemedicine in the course of my care at any time,  without affecting my right to future care or treatment, and that the Practitioner or I may terminate the telemedicine visit at any time. I understand that I have the right to inspect all information obtained and/or recorded in the course of the telemedicine visit and may receive copies of available information for a reasonable fee.  I understand that some of the potential risks of receiving the Services via telemedicine include:  Marland Kitchen Delay or interruption in medical evaluation due to technological equipment failure or disruption; . Information transmitted may not be sufficient (e.g. poor resolution of images) to allow for appropriate medical decision making by the Practitioner; and/or  . In rare instances, security protocols could fail, causing a breach of personal health information.  Furthermore, I acknowledge that it is my responsibility to provide information about my medical history, conditions and care that is complete and accurate to the best of my ability. I acknowledge that Practitioner's advice, recommendations, and/or decision may be based on factors not within their control, such as incomplete or inaccurate data provided by me or distortions of  diagnostic images or specimens that may result from electronic transmissions. I understand that the practice of medicine is not an exact science and that Practitioner makes no warranties or guarantees regarding treatment outcomes. I acknowledge that I will receive a copy of this consent concurrently upon execution via email to the email address I last provided but may also request a printed copy by calling the office of [facility].    I understand that my insurance will be billed for this visit.   I have read or had this consent read to me. . I understand the contents of this consent, which adequately explains the benefits and risks of the Services being provided via telemedicine.  . I have been provided ample opportunity to ask questions regarding  this consent and the Services and have had my questions answered to my satisfaction. . I give my informed consent for the services to be provided through the use of telemedicine in my medical care  By participating in this telemedicine visit I agree to the above.

## 2019-08-22 NOTE — Telephone Encounter (Signed)
-----   Message from Erlene Quan, [place] sent at 08/21/2019  1:41 PM EST ----- This patient can be a virtual follow up  [facility]

## 2019-08-24 ENCOUNTER — Other Ambulatory Visit: Payer: Self-pay | Admitting: Physician Assistant

## 2019-08-28 ENCOUNTER — Telehealth: Payer: Self-pay

## 2019-08-28 ENCOUNTER — Encounter: Payer: Self-pay | Admitting: Cardiology

## 2019-08-28 ENCOUNTER — Telehealth: Payer: 59 | Admitting: Cardiology

## 2019-08-28 ENCOUNTER — Other Ambulatory Visit: Payer: Self-pay

## 2019-08-28 DIAGNOSIS — I7090 Unspecified atherosclerosis: Secondary | ICD-10-CM

## 2019-08-28 DIAGNOSIS — E785 Hyperlipidemia, unspecified: Secondary | ICD-10-CM

## 2019-08-28 DIAGNOSIS — G4730 Sleep apnea, unspecified: Secondary | ICD-10-CM

## 2019-08-28 DIAGNOSIS — I10 Essential (primary) hypertension: Secondary | ICD-10-CM

## 2019-08-28 DIAGNOSIS — D751 Secondary polycythemia: Secondary | ICD-10-CM

## 2019-08-28 DIAGNOSIS — I50812 Chronic right heart failure: Secondary | ICD-10-CM

## 2019-08-28 DIAGNOSIS — R0902 Hypoxemia: Secondary | ICD-10-CM

## 2019-08-28 DIAGNOSIS — Z720 Tobacco use: Secondary | ICD-10-CM

## 2019-08-28 DIAGNOSIS — J449 Chronic obstructive pulmonary disease, unspecified: Secondary | ICD-10-CM

## 2019-08-28 LAB — BASIC METABOLIC PANEL
BUN/Creatinine Ratio: 20 (ref 10–24)
BUN: 20 mg/dL (ref 8–27)
CO2: 21 mmol/L (ref 20–29)
Calcium: 9.2 mg/dL (ref 8.6–10.2)
Chloride: 100 mmol/L (ref 96–106)
Creatinine, Ser: 1.01 mg/dL (ref 0.76–1.27)
GFR calc Af Amer: 92 mL/min/{1.73_m2} (ref >59)
GFR calc non Af Amer: 80 mL/min/{1.73_m2} (ref >59)
Glucose: 109 mg/dL — ABNORMAL HIGH (ref 65–99)
Potassium: 3.7 mmol/L (ref 3.5–5.2)
Sodium: 141 mmol/L (ref 134–144)

## 2019-08-28 MED ORDER — DILTIAZEM HCL ER COATED BEADS 300 MG PO CP24: 300.0000 mg | ORAL_CAPSULE | Freq: Every day | ORAL | 3 refills | Status: DC

## 2019-08-28 NOTE — Telephone Encounter (Signed)
Called patient twice to get virtual visit started nut no answer left message for patient to contact office to get virtual visit started.

## 2019-08-29 ENCOUNTER — Telehealth: Payer: Self-pay | Admitting: Cardiology

## 2019-08-29 ENCOUNTER — Ambulatory Visit: Payer: 59 | Admitting: Cardiology

## 2019-08-29 MED ORDER — DILTIAZEM HCL ER COATED BEADS 300 MG PO CP24: 300.0000 mg | ORAL_CAPSULE | Freq: Every day | ORAL | 3 refills | Status: DC

## 2019-08-29 MED ORDER — LOSARTAN POTASSIUM 50 MG PO TABS: 50.0000 mg | ORAL_TABLET | Freq: Every day | ORAL | 3 refills | Status: DC

## 2019-08-29 NOTE — Telephone Encounter (Signed)
Pt aware refills sent via epic and to make sure keeps appt tom with Kerin Ransom PA./cy

## 2019-08-29 NOTE — Telephone Encounter (Signed)
New Message  Pt c/o medication issue:  1. Name of Medication: diltiazem (CARDIZEM CD) 300 MG 24 hr capsule and losartan (COZAAR) 25 MG tablet  2. How are you currently taking this medication (dosage and times per day)? As written  3. Are you having a reaction (difficulty breathing--STAT)? No  4. What is your medication issue? Pt needs a new prescription due to being out of medication [id number] - [place], [place]

## 2019-08-30 ENCOUNTER — Encounter: Payer: Self-pay | Admitting: Cardiology

## 2019-08-30 ENCOUNTER — Telehealth: Payer: Self-pay

## 2019-08-30 ENCOUNTER — Telehealth (INDEPENDENT_AMBULATORY_CARE_PROVIDER_SITE_OTHER): Payer: 59 | Admitting: Cardiology

## 2019-08-30 VITALS — BP 166/98 | HR 91 | Ht 71.0 in | Wt 184.0 lb

## 2019-08-30 DIAGNOSIS — I7090 Unspecified atherosclerosis: Secondary | ICD-10-CM

## 2019-08-30 DIAGNOSIS — I10 Essential (primary) hypertension: Secondary | ICD-10-CM

## 2019-08-30 DIAGNOSIS — J449 Chronic obstructive pulmonary disease, unspecified: Secondary | ICD-10-CM

## 2019-08-30 DIAGNOSIS — I50812 Chronic right heart failure: Secondary | ICD-10-CM

## 2019-08-30 DIAGNOSIS — R0902 Hypoxemia: Secondary | ICD-10-CM

## 2019-08-30 NOTE — Patient Instructions (Addendum)
Medication Instructions:  Your physician recommends that you continue on your current medications as directed. Please refer to the Current Medication list given to you today. *If you need a refill on your cardiac medications before your next appointment, please call your pharmacy*  Lab Work: None  If you have labs (blood work) drawn today and your tests are completely normal, you will receive your results only by: Marland Kitchen MyChart Message (if you have MyChart) OR . A paper copy in the mail If you have any lab test that is abnormal or we need to change your treatment, we will call you to review the results.  Testing/Procedures: None   Follow-Up: At [facility], you and your health needs are our priority.  As part of our continuing mission to provide you with exceptional heart care, we have created designated Provider Care Teams.  These Care Teams include your primary Cardiologist (physician) and Advanced Practice Providers (APPs -  Physician Assistants and Nurse Practitioners) who all work together to provide you with the care you need, when you need it.  Your next appointment:   1 month(s)  The format for your next appointment:   Virtual Visit   Provider:   Kerin Ransom, PA-C  Other Instructions CHECK [place] 1-2 HOURS AFTER BREAKFAST  [facility] ONE OF OUR SOCIAL WORKERS WILL SEND YOU A BLOOD PRESSURE CUFF  SENDING A COPY OF [facility] HANDOUT TO HELP YOU FIND A PCP

## 2019-08-30 NOTE — Telephone Encounter (Signed)
Contacted patient to discuss AVS Instructions. Gave patient Luke's recommendations from today's virtual office visit. Follow up appt scheduled. Patient voiced understanding and AVS mailed.

## 2019-08-30 NOTE — Progress Notes (Signed)
Virtual Visit via Telephone Note   This visit type was conducted due to national recommendations for restrictions regarding the COVID-19 Pandemic (e.g. social distancing) in an effort to limit this patient's exposure and mitigate transmission in our community.  Due to his co-morbid illnesses, this patient is at least at moderate risk for complications without adequate follow up.  This format is felt to be most appropriate for this patient at this time.  The patient did not have access to video technology/had technical difficulties with video requiring transitioning to audio format only (telephone).  All issues noted in this document were discussed and addressed.  No physical exam could be performed with this format.  Please refer to the patient's chart for his  consent to telehealth for [facility].   Date:  08/30/2019   ID:  James Stokes, DOB 1955-10-15, MRN [id number]  Patient Location: Home Provider Location: Home  PCP:  Levin Bacon, NP  Cardiologist:  [name], MD  Electrophysiologist:  None   Evaluation Performed:  Follow-Up Visit  Chief Complaint:  none  History of Present Illness:    James Stokes is a 63 y.o. male with a history of COPD, he is a former 2 pack a day smoker, and a history of daily alcohol use.  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  He was admitted in November 2019 from the office with acute respiratory failure.  Echocardiogram results revealed preserved LV function with an EF of 60 to 65% with grade 1 diastolic dysfunction.  He has mild to moderate RV and RA dilatation.  He was felt to have combined COPD and right heart failure at that time.  He diuresed 10 L.  Since then he has been placed on oxygen at night.  I saw him in the office 08/02/2019 and his blood pressure was running high.  I added HCTZ to his medications and he was to track his blood pressure and follow-up with [place] in a few weeks.  He was contacted today for the follow-up.   Unfortunately did not track his blood pressures.  In fact he admitted he had not checked it at all since I saw him.  The patient does not have symptoms concerning for COVID-19 infection (fever, chills, cough, or new shortness of breath).    Past Medical History:  Diagnosis Date  . Acute diastolic CHF (congestive heart failure) ([place]) 08/22/2018  . Atherosclerotic plaque on cardiac vessels, seen on CT chest 08/22/2018  . Dyslipidemia   . ETOH abuse   . HTN (hypertension)   . Hypertriglyceridemia   . Kidney stones   . Sleep apnea in adult 08/29/2018   C-pap ordered June 2020- Dr Halford Chessman reviewed the study  . Smoker    Past Surgical History:  Procedure Laterality Date  . HERNIA REPAIR     pt was 39 month old  . HERNIA REPAIR     double hernia pt unsure of exact date, DR. Ballen did surgery  . lipoma  05/1989   fatty tumor on back     Current Meds  Medication Sig  . albuterol (PROVENTIL HFA;VENTOLIN HFA) 108 (90 Base) MCG/ACT inhaler Inhale 2 puffs into the lungs every 6 (six) hours as needed for wheezing or shortness of breath (cough, shortness of breath or wheezing.).  Marland Kitchen aspirin 81 MG tablet Take 81 mg by mouth every morning.   Marland Kitchen BREO ELLIPTA 100-25 MCG/INH AEPB TAKE 1 PUFF BY MOUTH EVERY DAY  . diltiazem (CARDIZEM  CD) 300 MG 24 hr capsule Take 1 capsule (300 mg total) by mouth daily.  . hydrochlorothiazide (MICROZIDE) 12.5 MG capsule Take 1 capsule (12.5 mg total) by mouth daily.  Marland Kitchen losartan (COZAAR) 50 MG tablet Take 1 tablet (50 mg total) by mouth daily.  . Multiple Vitamins-Minerals (MULTIVITAMIN ADULT PO) Take 1 tablet by mouth every morning.   . umeclidinium bromide (INCRUSE ELLIPTA) 62.5 MCG/INH AEPB Inhale 1 puff into the lungs daily.     Allergies:   Other and Shrimp [shellfish allergy]   Social History   Tobacco Use  . Smoking status: Former Smoker    Packs/day: 2.00    Years: 43.00    Pack years: 86.00    Types: Cigarettes    Quit date: 08/17/2018    Years since  quitting: 1.0  . Smokeless tobacco: Never Used  Substance Use Topics  . Alcohol use: Yes    Alcohol/week: 15.0 standard drinks    Types: 15 Cans of beer per week    Comment: "6-10 beers a night"  . Drug use: Yes    Types: Marijuana     Family Hx: The patient's family history includes COPD in his mother; Cancer in his father; Diabetes in his paternal aunt and paternal uncle; Hyperlipidemia in his father; Hypertension in his father; Vascular Disease (age of onset: 12) in his father.  ROS:   Please see the history of present illness.    All other systems reviewed and are negative.   Prior CV studies:   The following studies were reviewed today:  Echo Nov 2019  Labs/Other Tests and Data Reviewed:    EKG:  No ECG reviewed.  Recent Labs: 10/04/2018: Hemoglobin 16.3; Platelets 182.0 08/27/2019: BUN 20; Creatinine, Ser 1.01; Potassium 3.7; Sodium 141   Recent Lipid Panel Lab Results  Component Value Date/Time   CHOL 235 (H) 03/24/2018 09:39 AM   TRIG 423 (H) 03/24/2018 09:39 AM   HDL 45 03/24/2018 09:39 AM   CHOLHDL 5.2 (H) 03/24/2018 09:39 AM   CHOLHDL 6.8 (H) 01/25/2017 09:01 AM   LDLCALC Comment 03/24/2018 09:39 AM   LDLDIRECT 75 01/25/2017 09:01 AM    Wt Readings from Last 3 Encounters:  08/30/19 184 lb (83.5 kg)  08/02/19 192 lb 12.8 oz (87.5 kg)  03/15/19 190 lb (86.2 kg)     Objective:    Vital Signs:  BP (!) 166/98   Pulse 91   Ht 5\' 11"  (1.803 m)   Wt 184 lb (83.5 kg)   BMI 25.66 kg/m    VITAL SIGNS:  reviewed  ASSESSMENT & PLAN:    Essential hypertension He didn't track his B/P after I added HCTZ- we will try this again and I'll see him in follow up in a few weeks.   History of chronic respiratory failure with hypoxia ([place]) Admitted from the office 08/17/18- improved after 10L diuresis (Rt heart failure) andtreatment of his COPD  Atherosclerotic plaque on cardiac vessels, seen on CT chest Incidental finding- no history of angina  COPD with  hypoxia ([place]) Now onnighttimeO2. He is not smoking  COVID-19 Education: The signs and symptoms of COVID-19 were discussed with the patient and how to seek care for testing (follow up with PCP or arrange E-visit).  The importance of social distancing was discussed today.  Time:   Today, I have spent 15 minutes with the patient with telehealth technology discussing the above problems.     Medication Adjustments/Labs and Tests Ordered: Current medicines are reviewed at length  with the patient today.  Concerns regarding medicines are outlined above.   Tests Ordered: No orders of the defined types were placed in this encounter.   Medication Changes: No orders of the defined types were placed in this encounter.   Follow Up:  Virtual Visit  Track B/P- virtual follow up in 3-4 weeks  Signed, Kerin Ransom, PA-C  08/30/2019 [place]

## 2019-09-13 ENCOUNTER — Telehealth: Payer: Self-pay | Admitting: Licensed Clinical Social Worker

## 2019-09-13 NOTE — Telephone Encounter (Signed)
CSW referred to assist patient with obtaining a BP cuff. CSW contacted patient to inform cuff will be delivered to home. Patient grateful for support and assistance. CSW available as needed. Jackie Shuntavia Yerby, LCSW, CCSW-MCS [phone]  

## 2019-09-29 ENCOUNTER — Other Ambulatory Visit: Payer: Self-pay | Admitting: Internal Medicine

## 2019-10-02 ENCOUNTER — Telehealth: Payer: Self-pay

## 2019-10-02 ENCOUNTER — Telehealth (INDEPENDENT_AMBULATORY_CARE_PROVIDER_SITE_OTHER): Payer: 59 | Admitting: Cardiology

## 2019-10-02 ENCOUNTER — Encounter: Payer: Self-pay | Admitting: Cardiology

## 2019-10-02 VITALS — BP 145/90 | HR 82 | Ht 71.0 in | Wt 187.0 lb

## 2019-10-02 DIAGNOSIS — I10 Essential (primary) hypertension: Secondary | ICD-10-CM | POA: Diagnosis not present

## 2019-10-02 DIAGNOSIS — R0902 Hypoxemia: Secondary | ICD-10-CM | POA: Diagnosis not present

## 2019-10-02 DIAGNOSIS — J449 Chronic obstructive pulmonary disease, unspecified: Secondary | ICD-10-CM

## 2019-10-02 MED ORDER — LOSARTAN POTASSIUM 100 MG PO TABS: 100.0000 mg | ORAL_TABLET | Freq: Every day | ORAL | 3 refills | Status: DC

## 2019-10-02 NOTE — Telephone Encounter (Signed)
Called patient to discuss AVS instructions gave Luke's recommendations and patient voiced understanding. Patient is scheduled for follow up appointment. AVS summary mailed to patient.

## 2019-10-02 NOTE — Progress Notes (Signed)
Virtual Visit via Telephone Note   This visit type was conducted due to national recommendations for restrictions regarding the COVID-19 Pandemic (e.g. social distancing) in an effort to limit this patient's exposure and mitigate transmission in our community.  Due to his co-morbid illnesses, this patient is at least at moderate risk for complications without adequate follow up.  This format is felt to be most appropriate for this patient at this time.  The patient did not have access to video technology/had technical difficulties with video requiring transitioning to audio format only (telephone).  All issues noted in this document were discussed and addressed.  No physical exam could be performed with this format.  Please refer to the patient's chart for his  consent to telehealth for [facility].   Date:  10/02/2019   ID:  James Stokes, DOB Feb 14, 1956, MRN [id number]  Patient Location: Home Provider Location: Home  PCP:  Levin Bacon, NP  Cardiologist:  [name], MD  Electrophysiologist:  None   Evaluation Performed:  Follow-Up Visit  Chief Complaint:  none  History of Present Illness:    James Stokes is a 64 y.o. male with a history of COPD, he is a former 2 pack a day smoker, and a history of daily alcohol use.  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  He was admitted in November 2019 from the office with acute respiratory failure.  Echocardiogram revealed preserved LV function with an EF of 60 to 65% with grade 1 diastolic dysfunction.  He had mild to moderate RV and RA dilatation.  He was felt to have combined COPD and right heart failure at that time.  He diuresed 10 L.  Since then he has been placed on oxygen at night and has stopped smoking.  I saw him in the office 08/02/2019 and his blood pressure was running high.  I added HCTZ to his medications.  He was contacted today for f/u.  His B/P is still running high- 140/90-170/100.   He otherwise has been doing well.  O2 sat 97-99.  He says he busier now that he is retired.   The patient does not have symptoms concerning for COVID-19 infection (fever, chills, cough, or new shortness of breath).    Past Medical History:  Diagnosis Date  . Acute diastolic CHF (congestive heart failure) ([place]) 08/22/2018  . Atherosclerotic plaque on cardiac vessels, seen on CT chest 08/22/2018  . Dyslipidemia   . ETOH abuse   . HTN (hypertension)   . Hypertriglyceridemia   . Kidney stones   . Sleep apnea in adult 08/29/2018   C-pap ordered June 2020- Dr Halford Chessman reviewed the study  . Smoker    Past Surgical History:  Procedure Laterality Date  . HERNIA REPAIR     pt was 62 month old  . HERNIA REPAIR     double hernia pt unsure of exact date, DR. Ballen did surgery  . lipoma  05/1989   fatty tumor on back     Current Meds  Medication Sig  . albuterol (PROVENTIL HFA;VENTOLIN HFA) 108 (90 Base) MCG/ACT inhaler Inhale 2 puffs into the lungs every 6 (six) hours as needed for wheezing or shortness of breath (cough, shortness of breath or wheezing.).  Marland Kitchen aspirin 81 MG tablet Take 81 mg by mouth every morning.   . diltiazem (CARDIZEM CD) 300 MG 24 hr capsule Take 1 capsule (300 mg total) by mouth daily.  . hydrochlorothiazide (  MICROZIDE) 12.5 MG capsule Take 1 capsule (12.5 mg total) by mouth daily.  . INCRUSE ELLIPTA 62.5 MCG/INH AEPB TAKE 1 PUFF BY MOUTH EVERY DAY  . losartan (COZAAR) 50 MG tablet Take 1 tablet (50 mg total) by mouth daily.  . Multiple Vitamins-Minerals (MULTIVITAMIN ADULT PO) Take 1 tablet by mouth every morning.      Allergies:   Other and Shrimp [shellfish allergy]   Social History   Tobacco Use  . Smoking status: Former Smoker    Packs/day: 2.00    Years: 43.00    Pack years: 86.00    Types: Cigarettes    Quit date: 08/17/2018    Years since quitting: 1.1  . Smokeless tobacco: Never Used  Substance Use Topics  . Alcohol use: Yes    Alcohol/week: 15.0  standard drinks    Types: 15 Cans of beer per week    Comment: "6-10 beers a night"  . Drug use: Yes    Types: Marijuana     Family Hx: The patient's family history includes COPD in his mother; Cancer in his father; Diabetes in his paternal aunt and paternal uncle; Hyperlipidemia in his father; Hypertension in his father; Vascular Disease (age of onset: 34) in his father.  ROS:   Please see the history of present illness.    All other systems reviewed and are negative.   Prior CV studies:   The following studies were reviewed today: Echo Nov 2019  Labs/Other Tests and Data Reviewed:    EKG:  No ECG reviewed.  Recent Labs: 10/04/2018: Hemoglobin 16.3; Platelets 182.0 08/27/2019: BUN 20; Creatinine, Ser 1.01; Potassium 3.7; Sodium 141   Recent Lipid Panel Lab Results  Component Value Date/Time   CHOL 235 (H) 03/24/2018 09:39 AM   TRIG 423 (H) 03/24/2018 09:39 AM   HDL 45 03/24/2018 09:39 AM   CHOLHDL 5.2 (H) 03/24/2018 09:39 AM   CHOLHDL 6.8 (H) 01/25/2017 09:01 AM   LDLCALC Comment 03/24/2018 09:39 AM   LDLDIRECT 75 01/25/2017 09:01 AM    Wt Readings from Last 3 Encounters:  10/02/19 187 lb (84.8 kg)  08/30/19 184 lb (83.5 kg)  08/02/19 192 lb 12.8 oz (87.5 kg)     Objective:    Vital Signs:  BP (!) 145/90   Pulse 82   Ht 5\' 11"  (1.803 m)   Wt 187 lb (84.8 kg)   BMI 26.08 kg/m    VITAL SIGNS:  reviewed  ASSESSMENT & PLAN:    Essential hypertension B/P still running high- increase Losartan, trying to avoid adding a beta blocker secondary to COPD  History ofchronic respiratory failure with hypoxia ([place]) Admitted from the office 08/17/18- improved after 10L diuresis (Rt heart failure) andtreatment of his COPD  Atherosclerotic plaque on cardiac vessels, seen on CT chest Incidental finding- no history of angina  COPD with hypoxia ([facility]) Now onnighttimeO2. He is not smoking  COVID-19 Education: The signs and symptoms of COVID-19 were discussed with  the patient and how to seek care for testing (follow up with PCP or arrange E-visit).  The importance of social distancing was discussed today.  Time:   Today, I have spent 15 minutes with the patient with telehealth technology discussing the above problems.     Medication Adjustments/Labs and Tests Ordered: Current medicines are reviewed at length with the patient today.  Concerns regarding medicines are outlined above.   Tests Ordered: No orders of the defined types were placed in this encounter.   Medication Changes: No orders  of the defined types were placed in this encounter.   Follow Up:  Virtual Visit  increase Losartan to 100 mg daily- f/u with me in 4 weeks.  Angelena Form, PA-C  10/02/2019 9:47 AM    [facility]

## 2019-10-02 NOTE — Patient Instructions (Signed)
Medication Instructions:   INCREASE LOSARTAN to 100 mg daily  *If you need a refill on your cardiac medications before your next appointment, please call your pharmacy*  Lab Work: NONE ordered at this time of appointment    If you have labs (blood work) drawn today and your tests are completely normal, you will receive your results only by: Marland Kitchen MyChart Message (if you have MyChart) OR . A paper copy in the mail If you have any lab test that is abnormal or we need to change your treatment, we will call you to review the results.  Testing/Procedures: NONE ordered at this time of appointment   Follow-Up: At [facility], you and your health needs are our priority.  As part of our continuing mission to provide you with exceptional heart care, we have created designated Provider Care Teams.  These Care Teams include your primary Cardiologist (physician) and Advanced Practice Providers (APPs -  Physician Assistants and Nurse Practitioners) who all work together to provide you with the care you need, when you need it.  Your next appointment:   4 week(s)  The format for your next appointment:   Virtual Visit   Provider:   Kerin Ransom, PA-C  Other Instructions

## 2019-10-26 ENCOUNTER — Other Ambulatory Visit: Payer: Self-pay | Admitting: Internal Medicine

## 2019-10-30 ENCOUNTER — Encounter: Payer: Self-pay | Admitting: Cardiology

## 2019-10-30 ENCOUNTER — Telehealth (INDEPENDENT_AMBULATORY_CARE_PROVIDER_SITE_OTHER): Payer: 59 | Admitting: Cardiology

## 2019-10-30 ENCOUNTER — Telehealth: Payer: Self-pay

## 2019-10-30 VITALS — BP 151/82 | HR 76 | Ht 71.0 in | Wt 188.0 lb

## 2019-10-30 DIAGNOSIS — I10 Essential (primary) hypertension: Secondary | ICD-10-CM | POA: Diagnosis not present

## 2019-10-30 DIAGNOSIS — J449 Chronic obstructive pulmonary disease, unspecified: Secondary | ICD-10-CM

## 2019-10-30 DIAGNOSIS — E785 Hyperlipidemia, unspecified: Secondary | ICD-10-CM

## 2019-10-30 DIAGNOSIS — I7090 Unspecified atherosclerosis: Secondary | ICD-10-CM

## 2019-10-30 DIAGNOSIS — G4730 Sleep apnea, unspecified: Secondary | ICD-10-CM

## 2019-10-30 DIAGNOSIS — I50812 Chronic right heart failure: Secondary | ICD-10-CM

## 2019-10-30 NOTE — Patient Instructions (Signed)
Medication Instructions:  Your physician recommends that you continue on your current medications as directed. Please refer to the Current Medication list given to you today. *If you need a refill on your cardiac medications before your next appointment, please call your pharmacy*  Lab Work: Your physician recommends that you return for lab work in: James Stokes the next week  If you have labs (blood work) drawn today and your tests are completely normal, you will receive your results only by: Marland Kitchen MyChart Message (if you have MyChart) OR . A paper copy in the mail If you have any lab test that is abnormal or we need to change your treatment, we will call you to review the results.  Testing/Procedures: None   Follow-Up: At [facility], you and your health needs are our priority.  As part of our continuing mission to provide you with exceptional heart care, we have created designated Provider Care Teams.  These Care Teams include your primary Cardiologist (physician) and Advanced Practice Providers (APPs -  Physician Assistants and Nurse Practitioners) who all work together to provide you with the care you need, when you need it.  Your next appointment:   6 month(s)  The format for your next appointment:   In Person  Provider:   Kerin Ransom, PA-C  Other Instructions PLEASE [place]

## 2019-10-30 NOTE — Addendum Note (Signed)
Addended by: Ulice Brilliant T on: 10/30/2019 11:45 AM   Modules accepted: Orders

## 2019-10-30 NOTE — Progress Notes (Signed)
Virtual Visit via Telephone Note   This visit type was conducted due to national recommendations for restrictions regarding the COVID-19 Pandemic (e.g. social distancing) in an effort to limit this patient's exposure and mitigate transmission in our community.  Due to his co-morbid illnesses, this patient is at least at moderate risk for complications without adequate follow up.  This format is felt to be most appropriate for this patient at this time.  The patient did not have access to video technology/had technical difficulties with video requiring transitioning to audio format only (telephone).  All issues noted in this document were discussed and addressed.  No physical exam could be performed with this format.  Please refer to the patient's chart for his  consent to telehealth for [facility].   Date:  10/30/2019   ID:  James Stokes, DOB 06-29-1956, MRN [id number]  Patient Location: Home Provider Location: Home  PCP:  Levin Bacon, NP  Cardiologist:  [name], MD  Electrophysiologist:  None   Evaluation Performed:  Follow-Up Visit  Chief Complaint:  none  History of Present Illness:    James Stokes is a 64 y.o. male with a history of COPD, he is a former 2 pack a day smoker, and has a history of daily alcohol use. 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 He was admitted in November 2019 from the office with acute respiratory failure. Echocardiogram revealed preserved LV function with an EF of 60 to 65% with grade 1 diastolic dysfunction. He had mild to moderate RV and RA dilatation. He was felt to have combined COPD and right heart failure at that time. He diuresed 10 L. Since then he has been placed on oxygen at night and has stopped smoking.   I saw him in the office in Nov 2020 and noted his B/P was high. I added HCTZ and had him f/u 10/02/2019.  His B/P was still running high and I increased his Losartan to 100 mg daily.  He  was contacted today for follow up.  His B/P is fairly labile but he seems to have more readings in the [id number] systolic range.  I suggested we continue the current Rx and he will continue to monitor his B/P at home ( we provided him a cuff). I can see him in f/u in 6 months, check BMP next week.   The patient does not have symptoms concerning for COVID-19 infection (fever, chills, cough, or new shortness of breath).    Past Medical History:  Diagnosis Date  . Acute diastolic CHF (congestive heart failure) ([place]) 08/22/2018  . Atherosclerotic plaque on cardiac vessels, seen on CT chest 08/22/2018  . Dyslipidemia   . ETOH abuse   . HTN (hypertension)   . Hypertriglyceridemia   . Kidney stones   . Sleep apnea in adult 08/29/2018   C-pap ordered June 2020- Dr Halford Chessman reviewed the study  . Smoker    Past Surgical History:  Procedure Laterality Date  . HERNIA REPAIR     pt was 38 month old  . HERNIA REPAIR     double hernia pt unsure of exact date, DR. Ballen did surgery  . lipoma  05/1989   fatty tumor on back     Current Meds  Medication Sig  . albuterol (PROVENTIL HFA;VENTOLIN HFA) 108 (90 Base) MCG/ACT inhaler Inhale 2 puffs into the lungs every 6 (six) hours as needed for wheezing or shortness of breath (cough, shortness  of breath or wheezing.).  Marland Kitchen aspirin 81 MG tablet Take 81 mg by mouth every morning.   . diltiazem (CARDIZEM CD) 300 MG 24 hr capsule Take 1 capsule (300 mg total) by mouth daily.  . hydrochlorothiazide (MICROZIDE) 12.5 MG capsule Take 1 capsule (12.5 mg total) by mouth daily.  . INCRUSE ELLIPTA 62.5 MCG/INH AEPB TAKE 1 PUFF BY MOUTH EVERY DAY  . losartan (COZAAR) 100 MG tablet Take 1 tablet (100 mg total) by mouth daily.  . Multiple Vitamins-Minerals (MULTIVITAMIN ADULT PO) Take 1 tablet by mouth every morning.      Allergies:   Other and Shrimp [shellfish allergy]   Social History   Tobacco Use  . Smoking status: Former Smoker    Packs/day: 2.00    Years: 43.00     Pack years: 86.00    Types: Cigarettes    Quit date: 08/17/2018    Years since quitting: 1.2  . Smokeless tobacco: Never Used  Substance Use Topics  . Alcohol use: Yes    Alcohol/week: 15.0 standard drinks    Types: 15 Cans of beer per week    Comment: "6-10 beers a night"  . Drug use: Yes    Types: Marijuana     Family Hx: The patient's family history includes COPD in his mother; Cancer in his father; Diabetes in his paternal aunt and paternal uncle; Hyperlipidemia in his father; Hypertension in his father; Vascular Disease (age of onset: 37) in his father.  ROS:   Please see the history of present illness.    All other systems reviewed and are negative.   Prior CV studies:   The following studies were reviewed today: Echo nov 2019  Labs/Other Tests and Data Reviewed:    EKG:  An ECG dated 08/02/2019 was personally reviewed today and demonstrated:  NSR-HR 88  Recent Labs: 08/27/2019: BUN 20; Creatinine, Ser 1.01; Potassium 3.7; Sodium 141   Recent Lipid Panel Lab Results  Component Value Date/Time   CHOL 235 (H) 03/24/2018 09:39 AM   TRIG 423 (H) 03/24/2018 09:39 AM   HDL 45 03/24/2018 09:39 AM   CHOLHDL 5.2 (H) 03/24/2018 09:39 AM   CHOLHDL 6.8 (H) 01/25/2017 09:01 AM   LDLCALC Comment 03/24/2018 09:39 AM   LDLDIRECT 75 01/25/2017 09:01 AM    Wt Readings from Last 3 Encounters:  10/30/19 188 lb (85.3 kg)  10/02/19 187 lb (84.8 kg)  08/30/19 184 lb (83.5 kg)     Objective:    Vital Signs:  BP (!) 151/82   Pulse 76   Ht 5\' 11"  (1.803 m)   Wt 188 lb (85.3 kg)   BMI 26.22 kg/m    VITAL SIGNS:  reviewed  ASSESSMENT & PLAN:    Essential hypertension B/P still running high- increase Losartan, trying to avoid adding a beta blocker secondary to COPD  History ofchronic respiratory failure with hypoxia ([place]) Admitted from the office 08/17/18- improved after 10L diuresis (Rt heart failure) andtreatment of his COPD  Atherosclerotic plaque on cardiac  vessels, seen on CT chest Incidental finding- no history of angina  COPD with hypoxia ([place]) Now onnighttimeO2. He is not smoking  COVID-19 Education: The signs and symptoms of COVID-19 were discussed with the patient and how to seek care for testing (follow up with PCP or arrange E-visit).  The importance of social distancing was discussed today.  Time:   Today, I have spent 10 minutes with the patient with telehealth technology discussing the above problems.  Medication Adjustments/Labs and Tests Ordered: Current medicines are reviewed at length with the patient today.  Concerns regarding medicines are outlined above.   Tests Ordered: No orders of the defined types were placed in this encounter.   Medication Changes: No orders of the defined types were placed in this encounter.   Follow Up:  In Person with me in 6 months.  Signed, Kerin Ransom, PA-C  10/30/2019 10:14 AM    [place]

## 2019-10-30 NOTE — Telephone Encounter (Signed)
Contacted patient to discuss AVS Instructions. Gave patient Luke's recommendations from today's virtual office visit. Informed patient that someone from the scheduling dept will be in contact with them to schedule their follow up appt. Patient voiced understanding; AVS printed and mailed to patient.    

## 2019-11-08 LAB — BASIC METABOLIC PANEL
BUN/Creatinine Ratio: 17 (ref 10–24)
BUN: 23 mg/dL (ref 8–27)
CO2: 22 mmol/L (ref 20–29)
Calcium: 9.8 mg/dL (ref 8.6–10.2)
Chloride: 96 mmol/L (ref 96–106)
Creatinine, Ser: 1.34 mg/dL — ABNORMAL HIGH (ref 0.76–1.27)
GFR calc Af Amer: 66 mL/min/{1.73_m2} (ref >59)
GFR calc non Af Amer: 57 mL/min/{1.73_m2} — ABNORMAL LOW (ref >59)
Glucose: 91 mg/dL (ref 65–99)
Potassium: 4.2 mmol/L (ref 3.5–5.2)
Sodium: 136 mmol/L (ref 134–144)

## 2019-11-27 ENCOUNTER — Other Ambulatory Visit: Payer: Self-pay | Admitting: Cardiology

## 2020-02-15 ENCOUNTER — Other Ambulatory Visit: Payer: Self-pay | Admitting: Cardiology

## 2020-02-15 NOTE — Telephone Encounter (Signed)
*  STAT* If patient is at the pharmacy, call can be transferred to refill team.   1. Which medications need to be refilled? (please list name of each medication and dose if known)  New prescription for Losartan- need this called in today please- will  Be out of his medicine this weekendv  2. Which pharmacy/location (including street and city if local pharmacy) is medication to be sent to? [facility] [address], [place]  3. Do they need a 30 day or 90 day supply? 90 days and refills

## 2020-02-19 ENCOUNTER — Other Ambulatory Visit: Payer: Self-pay | Admitting: Cardiovascular Disease

## 2020-02-20 MED ORDER — DILTIAZEM HCL ER COATED BEADS 300 MG PO CP24: 300.0000 mg | ORAL_CAPSULE | Freq: Every day | ORAL | 0 refills | Status: DC

## 2020-02-20 MED ORDER — HYDROCHLOROTHIAZIDE 12.5 MG PO CAPS: ORAL_CAPSULE | ORAL | 0 refills | Status: DC

## 2020-02-20 MED ORDER — LOSARTAN POTASSIUM 100 MG PO TABS: 100.0000 mg | ORAL_TABLET | Freq: Every day | ORAL | 3 refills | Status: DC

## 2020-02-20 NOTE — Telephone Encounter (Signed)
Patient's wife calling back for refills. She states [facility]. She states the diltiazem and hydrochlorothiazide needs to be sent to [facility] for 90 days.

## 2020-02-20 NOTE — Telephone Encounter (Signed)
Patient's wife is calling to follow up in regards to request to request to refill medication. She states the patient has been completely out of medication for 4 days.    *STAT* If patient is at the pharmacy, call can be transferred to refill team.   1. Which medications need to be refilled? (please list name of each medication and dose if known) losartan (COZAAR) 100 MG tablet  2. Which pharmacy/location (including street and city if local pharmacy) is medication to be sent to[id number] [place] [place]  3. Do they need a 30 day or 90 day supply? 30 days supply       *STAT* If patient is at the pharmacy, call can be transferred to refill team.   1. Which medications need to be refilled? (please list name of each medication and dose if known)   diltiazem (CARDIZEM CD) 300 MG 24 hr capsule  hydrochlorothiazide (MICROZIDE) 12.5 MG capsule     2. Which pharmacy/location (including street and city if local pharmacy) is medication to be sent to? [id number] [place], [place] - [address]  3. Do they need a 30 day or 90 day supply? 90 day supply

## 2020-03-14 ENCOUNTER — Other Ambulatory Visit: Payer: Self-pay | Admitting: Cardiovascular Disease

## 2020-03-14 MED ORDER — DILTIAZEM HCL ER COATED BEADS 300 MG PO CP24: 300.0000 mg | ORAL_CAPSULE | Freq: Every day | ORAL | 3 refills | Status: DC

## 2020-03-14 MED ORDER — LOSARTAN POTASSIUM 100 MG PO TABS: 100.0000 mg | ORAL_TABLET | Freq: Every day | ORAL | 3 refills | Status: DC

## 2020-03-14 MED ORDER — HYDROCHLOROTHIAZIDE 12.5 MG PO CAPS: ORAL_CAPSULE | ORAL | 3 refills | Status: DC

## 2020-03-14 NOTE — Telephone Encounter (Signed)
*  STAT* If patient is at the pharmacy, call can be transferred to refill team.   1. Which medications need to be refilled? (please list name of each medication and dose if known)  diltiazem (CARDIZEM CD) 300 MG 24 hr capsule hydrochlorothiazide (MICROZIDE) 12.5 MG capsule losartan (COZAAR) 100 MG tablet  2. Which pharmacy/location (including street and city if local pharmacy) is medication to be sent to?  [place], [place] [employer], [address]  3. Do they need a 30 day or 90 day supply? 90 with refills  Pt is not out of medication yet. He just wants to make sure he will have new rx sent for a full year to [facility].

## 2020-03-26 ENCOUNTER — Ambulatory Visit (INDEPENDENT_AMBULATORY_CARE_PROVIDER_SITE_OTHER): Payer: 59 | Admitting: Cardiovascular Disease

## 2020-03-26 ENCOUNTER — Other Ambulatory Visit: Payer: Self-pay

## 2020-03-26 ENCOUNTER — Encounter: Payer: Self-pay | Admitting: Cardiovascular Disease

## 2020-03-26 VITALS — BP 136/78 | HR 89 | Ht 71.0 in | Wt 196.6 lb

## 2020-03-26 DIAGNOSIS — J449 Chronic obstructive pulmonary disease, unspecified: Secondary | ICD-10-CM

## 2020-03-26 DIAGNOSIS — I7090 Unspecified atherosclerosis: Secondary | ICD-10-CM | POA: Diagnosis not present

## 2020-03-26 DIAGNOSIS — Z720 Tobacco use: Secondary | ICD-10-CM | POA: Diagnosis not present

## 2020-03-26 DIAGNOSIS — E785 Hyperlipidemia, unspecified: Secondary | ICD-10-CM | POA: Diagnosis not present

## 2020-03-26 DIAGNOSIS — R0902 Hypoxemia: Secondary | ICD-10-CM

## 2020-03-26 LAB — LIPID PANEL
Chol/HDL Ratio: 16.8 ratio — ABNORMAL HIGH (ref 0.0–5.0)
Cholesterol, Total: 353 mg/dL — ABNORMAL HIGH (ref 100–199)
HDL: 21 mg/dL — ABNORMAL LOW (ref >39)
Triglycerides: 1768 mg/dL (ref 0–149)

## 2020-03-26 LAB — HEPATIC FUNCTION PANEL
ALT: 44 IU/L (ref 0–44)
AST: 24 IU/L (ref 0–40)
Albumin: 4.3 g/dL (ref 3.8–4.8)
Alkaline Phosphatase: 106 IU/L (ref 48–121)
Bilirubin Total: 0.4 mg/dL (ref 0.0–1.2)
Bilirubin, Direct: 0.13 mg/dL (ref 0.00–0.40)
Total Protein: 6.7 g/dL (ref 6.0–8.5)

## 2020-03-26 NOTE — Patient Instructions (Addendum)
Medication Instructions:  NO CHANGE *If you need a refill on your cardiac medications before your next appointment, please call your pharmacy*   Lab Work: Your physician recommends that you HAVE LAB WORK TODAY If you have labs (blood work) drawn today and your tests are completely normal, you will receive your results only by: Marland Kitchen MyChart Message (if you have MyChart) OR . A paper copy in the mail If you have any lab test that is abnormal or we need to change your treatment, we will call you to review the results.   Follow-Up: At [facility], you and your health needs are our priority.  As part of our continuing mission to provide you with exceptional heart care, we have created designated Provider Care Teams.  These Care Teams include your primary Cardiologist (physician) and Advanced Practice Providers (APPs -  Physician Assistants and Nurse Practitioners) who all work together to provide you with the care you need, when you need it.  We recommend signing up for the patient portal called "MyChart".  Sign up information is provided on this After Visit Summary.  MyChart is used to connect with patients for Virtual Visits (Telemedicine).  Patients are able to view lab/test results, encounter notes, upcoming appointments, etc.  Non-urgent messages can be sent to your provider as well.   To learn more about what you can do with MyChart, go to [email or web address].    Your next appointment:    Your physician recommends that you schedule a follow-up appointment in: [place]  Your physician wants you to follow-up in: [place] will receive a reminder letter in the mail two months in advance. If you don't receive a letter, please call our office to schedule the follow-up appointment.

## 2020-03-26 NOTE — Assessment & Plan Note (Signed)
History of essential hypertension blood pressure measured today of 136/78.  He is on diltiazem, hydrochlorothiazide and losartan.

## 2020-03-26 NOTE — Assessment & Plan Note (Signed)
Discontinued a year and half ago

## 2020-03-26 NOTE — Progress Notes (Signed)
03/26/2020 James Stokes [place]   [birth year]  [id number]  Primary Physician Levin Bacon, NP Primary Cardiologist: Lorretta Harp MD Gar[facility]d[place], [place], [place]  HPI:  James Stokes is a 64 y.o.  mildly overweight, married Caucasian male, father of 2, grandfather to 4 grandchildren who I last saw  03/16/2018... He has a history of hypertension and hyperlipidemia. He was smoking 2 packs a day but stopped November 2019.  He was also drinking 3-5 beers a night now he drinks 2 at most.  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  He has hypertriglyceridemia as well. His last Myoview performed May 06, 2004, was nonischemic.   Since I saw him 2 years ago he has seen Kerin Ransom several times was adjusted his blood pressure medication is now under better control.  He is on home O2 at night (2 L) does complain of chronic shortness of breath from COPD.  No outpatient medications have been marked as taking for the 03/26/20 encounter (Office Visit) with Lorretta Harp, MD.     Allergies  Allergen Reactions  . Other Hives and Itching  . Shrimp [Shellfish Allergy] Itching and Rash    Social History   Socioeconomic History  . Marital status: Married    Spouse name: Not on file  . Number of children: Not on file  . Years of education: Not on file  . Highest education level: Not on file  Occupational History  . Not on file  Tobacco Use  . Smoking status: Former Smoker    Packs/day: 2.00    Years: 43.00    Pack years: 86.00    Types: Cigarettes    Quit date: 08/17/2018    Years since quitting: 1.6  . Smokeless tobacco: Never Used  Vaping Use  . Vaping Use: Never used  Substance and Sexual Activity  . Alcohol use: Yes    Alcohol/week: 15.0 standard drinks    Types: 15 Cans of beer per week    Comment: "6-10 beers a night"  . Drug use: Yes    Types: Marijuana  . Sexual activity: Not on file  Other Topics Concern  . Not on file  Social History Narrative  .  Not on file   Social Determinants of Health   Financial Resource Strain:   . Difficulty of Paying Living Expenses:   Food Insecurity:   . Worried About Charity fundraiser in the Last Year:   . Arboriculturist in the Last Year:   Transportation Needs:   . Film/video editor (Medical):   Marland Kitchen Lack of Transportation (Non-Medical):   Physical Activity:   . Days of Exercise per Week:   . Minutes of Exercise per Session:   Stress:   . Feeling of Stress :   Social Connections:   . Frequency of Communication with Friends and Family:   . Frequency of Social Gatherings with Friends and Family:   . Attends Religious Services:   . Active Member of Clubs or Organizations:   . Attends Archivist Meetings:   Marland Kitchen Marital Status:   Intimate Partner Violence:   . Fear of Current or Ex-Partner:   . Emotionally Abused:   Marland Kitchen Physically Abused:   . Sexually Abused:      Review of Systems: General: negative for chills, fever, night sweats or weight changes.  Cardiovascular: negative for chest pain, dyspnea on exertion, edema, orthopnea, palpitations, paroxysmal nocturnal dyspnea or  shortness of breath Dermatological: negative for rash Respiratory: negative for cough or wheezing Urologic: negative for hematuria Abdominal: negative for nausea, vomiting, diarrhea, bright red blood per rectum, melena, or hematemesis Neurologic: negative for visual changes, syncope, or dizziness All other systems reviewed and are otherwise negative except as noted above.    Blood pressure 136/78, pulse 89, height 5\' 11"  (1.803 m), weight 196 lb 9.6 oz (89.2 kg), SpO2 98 %.  General appearance: alert and no distress Neck: no adenopathy, no carotid bruit, no JVD, supple, symmetrical, trachea midline and thyroid not enlarged, symmetric, no tenderness/mass/nodules Lungs: clear to auscultation bilaterally Heart: regular rate and rhythm, S1, S2 normal, no murmur, click, rub or gallop Extremities: extremities  normal, atraumatic, no cyanosis or edema Pulses: 2+ and symmetric Skin: Skin color, texture, turgor normal. No rashes or lesions Neurologic: Alert and oriented X 3, normal strength and tone. Normal symmetric reflexes. Normal coordination and gait  EKG sinus rhythm at 89 with right axis deviation and incomplete right bundle branch block.  I personally reviewed this EKG.  ASSESSMENT AND PLAN:   Essential hypertension History of essential hypertension blood pressure measured today of 136/78.  He is on diltiazem, hydrochlorothiazide and losartan.  Dyslipidemia History of dyslipidemia not on statin therapy.  We will recheck a lipid liver profile today.  His last lipid liver profile was notable for triglycerides of 423 probably related to his alcohol intake.  Tobacco abuse Discontinued a year and half ago  COPD with hypoxia ([place]) On home O2 at night      Lorretta Harp MD [facility], [facility] 03/26/2020 9:45 AM

## 2020-03-26 NOTE — Assessment & Plan Note (Signed)
On home O2 at night

## 2020-03-26 NOTE — Assessment & Plan Note (Signed)
History of dyslipidemia not on statin therapy.  We will recheck a lipid liver profile today.  His last lipid liver profile was notable for triglycerides of 423 probably related to his alcohol intake.

## 2020-04-15 ENCOUNTER — Telehealth: Payer: Self-pay | Admitting: Cardiovascular Disease

## 2020-04-15 NOTE — Telephone Encounter (Signed)
Pt called to make a lipid referral appointment to Dr. Debara Pickett from Dr. Gwenlyn Found.

## 2020-04-16 ENCOUNTER — Encounter: Payer: Self-pay | Admitting: General Practice

## 2020-06-06 ENCOUNTER — Telehealth (INDEPENDENT_AMBULATORY_CARE_PROVIDER_SITE_OTHER): Payer: 59 | Admitting: Internal Medicine

## 2020-06-06 ENCOUNTER — Encounter: Payer: Self-pay | Admitting: Internal Medicine

## 2020-06-06 VITALS — BP 128/67 | HR 91 | Ht 71.0 in | Wt 188.0 lb

## 2020-06-06 DIAGNOSIS — I7090 Unspecified atherosclerosis: Secondary | ICD-10-CM | POA: Diagnosis not present

## 2020-06-06 DIAGNOSIS — I5032 Chronic diastolic (congestive) heart failure: Secondary | ICD-10-CM

## 2020-06-06 DIAGNOSIS — E782 Mixed hyperlipidemia: Secondary | ICD-10-CM | POA: Diagnosis not present

## 2020-06-06 NOTE — Progress Notes (Signed)
Virtual Visit via Telephone Note   This visit type was conducted due to national recommendations for restrictions regarding the COVID-19 Pandemic (e.g. social distancing) in an effort to limit this patient's exposure and mitigate transmission in our community.  Due to his co-morbid illnesses, this patient is at least at moderate risk for complications without adequate follow up.  This format is felt to be most appropriate for this patient at this time.  The patient did not have access to video technology/had technical difficulties with video requiring transitioning to audio format only (telephone).  All issues noted in this document were discussed and addressed.  No physical exam could be performed with this format.  Please refer to the patient's chart for his  consent to telehealth for James Stokes.   Date:  06/06/2020   ID:  James Stokes, DOB [birth year], MRN [id number] The patient was identified using 2 identifiers.  Evaluation Performed:  New Patient Evaluation  Patient Location:  [place] [address]  Provider location:   [address], [address] [place], [place] [address]  PCP:  Levin Bacon, NP  Cardiologist:  [name], MD Electrophysiologist:  None   Chief Complaint:  Manage dyslipidemia  History of Present Illness:    James Stokes is a 64 y.o. male who presents via audio/video conferencing for a telehealth visit today. James Stokes is a pleasant 64 year old male kindly referred by Dr. Alvester Chou for evaluation and management of dyslipidemia. There is evidence for longstanding hypertriglyceridemia however he has not previously been on any therapies including a statin, fibrate or other lipid-lowering therapy. He was hospitalized in 6734 for COPD/diastolic heart failure exacerbation. He also has a history of some alcohol use which she is cut back significantly. He also stopped smoking 2 years ago. He has had better control of his blood pressure and  is compliant with CPAP. 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 He has no known coronary disease and had a negative Myoview last in 2005 but was shown to have coronary calcification by CT in 2019. Recently his lipid profile showed a sharp rise in triglycerides with total cholesterol 353, triglycerides 1768, HDL 21 and LDL could not be calculated. In fact he has had multiple elevated triglycerides on blood work including 2 years ago at 55, 695 the year before that and 853 the year before that. The only direct LDL that I could find was in 2018 which was 76, suggesting this may be a primary hypertriglyceridemia. He reports a varied diet but does have reasonable amount of saturated fat intake and sweets.  The patient does not have symptoms concerning for COVID-19 infection (fever, chills, cough, or new SHORTNESS OF BREATH).    Prior CV studies:   The following studies were reviewed today:  Chart reviewed  PMHx:  Past Medical History:  Diagnosis Date  . Acute diastolic CHF (congestive heart failure) ([place]) 08/22/2018  . Atherosclerotic plaque on cardiac vessels, seen on CT chest 08/22/2018  . Dyslipidemia   . ETOH abuse   . HTN (hypertension)   . Hypertriglyceridemia   . Kidney stones   . Sleep apnea in adult 08/29/2018   C-pap ordered June 2020- Dr Halford Chessman reviewed the study  . Smoker     Past Surgical History:  Procedure Laterality Date  . HERNIA REPAIR     pt was 75 month old  . HERNIA REPAIR  double hernia pt unsure of exact date, DR. Ballen did surgery  . lipoma  05/1989   fatty tumor on back    FAMHx:  Family History  Problem Relation Age of Onset  . COPD Mother   . Vascular Disease Father 4       aneurysm  . Hyperlipidemia Father   . Hypertension Father   . Cancer Father   . Diabetes Paternal Aunt   . Diabetes Paternal Uncle     SOCHx:   reports that he quit smoking about 21 months ago. His  smoking use included cigarettes. He has a 86.00 pack-year smoking history. He has never used smokeless tobacco. He reports current alcohol use of about 6.0 standard drinks of alcohol per week. He reports current drug use. Drug: Marijuana.  ALLERGIES:  Allergies  Allergen Reactions  . Other Hives and Itching  . Shrimp [Shellfish Allergy] Itching and Rash    MEDS:  Current Meds  Medication Sig  . albuterol (PROVENTIL HFA;VENTOLIN HFA) 108 (90 Base) MCG/ACT inhaler Inhale 2 puffs into the lungs every 6 (six) hours as needed for wheezing or shortness of breath (cough, shortness of breath or wheezing.).  Marland Kitchen aspirin 81 MG tablet Take 81 mg by mouth every morning.   . diltiazem (CARDIZEM CD) 300 MG 24 hr capsule Take 1 capsule (300 mg total) by mouth daily.  . hydrochlorothiazide (MICROZIDE) 12.5 MG capsule TAKE 1 CAPSULE BY MOUTH EVERY DAY  . losartan (COZAAR) 100 MG tablet Take 1 tablet (100 mg total) by mouth daily.  . Multiple Vitamins-Minerals (MULTIVITAMIN ADULT PO) Take 1 tablet by mouth every morning.      ROS: Pertinent items noted in HPI and remainder of comprehensive ROS otherwise negative.  Labs/Other Tests and Data Reviewed:    Recent Labs: 11/07/2019: BUN 23; Creatinine, Ser 1.34; Potassium 4.2; Sodium 136 03/26/2020: ALT 44   Recent Lipid Panel Lab Results  Component Value Date/Time   CHOL 353 (H) 03/26/2020 10:14 AM   TRIG 1,768 (HH) 03/26/2020 10:14 AM   HDL 21 (L) 03/26/2020 10:14 AM   CHOLHDL 16.8 (H) 03/26/2020 10:14 AM   CHOLHDL 6.8 (H) 01/25/2017 09:01 AM   LDLCALC Comment (A) 03/26/2020 10:14 AM   LDLDIRECT 75 01/25/2017 09:01 AM    Wt Readings from Last 3 Encounters:  06/06/20 188 lb (85.3 kg)  03/26/20 196 lb 9.6 oz (89.2 kg)  10/30/19 188 lb (85.3 kg)     Exam:    Vital Signs:  BP 128/67   Pulse 91   Ht 5\' 11"  (1.803 m)   Wt 188 lb (85.3 kg)   BMI 26.22 kg/m    Exam not performed due to telephone visit  ASSESSMENT & PLAN:    1. Mixed  dyslipidemia with primarily high triglycerides and low HDL 2. Coronary artery calcification 3. COPD, severe 4. History of diastolic heart failure  James Stokes recently had a significant jump in his triglycerides, this may been related to dietary changes and inactivity. He reports that he is cut back on his alcohol use. He says he is not smoked in 2 years. He had not received steroids or other medications which might affect his triglycerides recently. The etiology for this jump is not clear. He has never been on any lipid-lowering therapy but does have evidence of atherosclerosis. His triglycerides are longstanding suggestive of a primary triglyceride disorder. LDL has been at least over 70 in the past but has not been measured in many years. I would recommend starting  statin therapy per current guidelines with rosuvastatin 20 mg daily. We will plan a repeat lipid profile fasting in about 3 months as well as a direct LDL. He will likely need additional therapy with Vascepa and possibly a fibrate based on how high his levels are. He reported a history of shellfish allergy but has been able to eat fresh water fish and I think we will not have any issues with Vascepa if we need to use it.  Thanks again for the kind referral.  COVID-19 Education: The signs and symptoms of COVID-19 were discussed with the patient and how to seek care for testing (follow up with PCP or arrange E-visit).  The importance of social distancing was discussed today.  Patient Risk:   After full review of this patients clinical status, I feel that they are at least moderate risk at this time.  Time:   Today, I have spent 25 minutes with the patient with telehealth technology discussing dyslipidemia, high triglycerides, dietary approaches to TG reduction, statin therapy / possible side effects.     Medication Adjustments/Labs and Tests Ordered: Current medicines are reviewed at length with the patient today.  Concerns regarding  medicines are outlined above.   Tests Ordered: No orders of the defined types were placed in this encounter.   Medication Changes: No orders of the defined types were placed in this encounter.   Disposition:  in 3 month(s)  Pixie Casino, MD, [facility], [place] Director of the Advanced Lipid Disorders &  Cardiovascular Risk Reduction Clinic Diplomate of the American Board of Clinical Lipidology Attending Cardiologist  Direct Dial: [phone]  Fax: [phone]  Website:  [email or web address]  Pixie Casino, MD  06/06/2020 9:35 AM

## 2020-07-18 ENCOUNTER — Telehealth: Payer: Self-pay | Admitting: Internal Medicine

## 2020-07-18 MED ORDER — ROSUVASTATIN CALCIUM 20 MG PO TABS
20.0000 mg | ORAL_TABLET | Freq: Every day | ORAL | 3 refills | Status: DC
Start: 2020-07-18 — End: 2020-10-28

## 2020-07-18 NOTE — Telephone Encounter (Signed)
Patient's wife states that at his last visit it was recommend he start Crestor but nothing was every sent to his pharmacy.  She wants to know if he needs to start it and when he should follow up .

## 2020-07-18 NOTE — Telephone Encounter (Signed)
Per chart review, no AVS was completed for 06/06/2020 visit.  crestor 20mg  was prescribed by MD per note but not sent to pharmacy  Rx sent to [facility] per request

## 2020-07-18 NOTE — Telephone Encounter (Signed)
Returned the call to the patient's wife per the DPR. She was calling to see if the patient was supposed to be on the Rosuvastatin 20 mg once daily. She stated that this was discussed at the office visit in September but was never finalized or called in.

## 2020-09-05 ENCOUNTER — Telehealth: Payer: Self-pay

## 2020-09-05 DIAGNOSIS — E785 Hyperlipidemia, unspecified: Secondary | ICD-10-CM

## 2020-09-05 NOTE — Telephone Encounter (Signed)
Pt presented to the office today mistakenly under the impression that he was scheduled for an office visit with Dr. Debara Pickett. No appointment was on file for the pt, but upon review of chart it appears that pt was due to follow up with Dr. Debara Pickett 12/22 after several weeks on Crestor. This visit had been cancelled by our office, but was not rescheduled.   After conferring with Dr. Lysbeth Penner nurse, orders placed for CMET and lipids. Pt is not fasting today, but will return to the office fasting to have blood work completed. Requisition forms and instructions given. Pt escorted to check out where a follow up visit with Dr. Debara Pickett was rescheduled for October 09, 2020 at 0830. The patient verbalizes understanding and agreement with plan.

## 2020-09-17 ENCOUNTER — Ambulatory Visit: Payer: 59 | Admitting: Internal Medicine

## 2020-10-09 ENCOUNTER — Ambulatory Visit: Payer: 59 | Admitting: Internal Medicine

## 2020-10-27 ENCOUNTER — Telehealth: Payer: Self-pay | Admitting: Cardiovascular Disease

## 2020-10-27 NOTE — Telephone Encounter (Signed)
*  STAT* If patient is at the pharmacy, call can be transferred to refill team.   1. Which medications need to be refilled? (please list name of each medication and dose if known) hydrochlorothiazide (MICROZIDE) 12.5 MG capsule losartan (COZAAR) 100 MG tablet diltiazem (CARDIZEM CD) 300 MG 24 hr capsule rosuvastatin (CRESTOR) 20 MG tablet(Expired) 2. Which pharmacy/location (including street and city if local pharmacy) is medication to be sent to? [place], [address], [address]  3. Do they need a 30 day or 90 day supply? 90 day supply

## 2020-10-28 MED ORDER — ROSUVASTATIN CALCIUM 20 MG PO TABS: 20.0000 mg | ORAL_TABLET | Freq: Every day | ORAL | 3 refills | Status: DC

## 2020-10-28 MED ORDER — DILTIAZEM HCL ER COATED BEADS 300 MG PO CP24: 300.0000 mg | ORAL_CAPSULE | Freq: Every day | ORAL | 3 refills | Status: DC

## 2020-10-28 MED ORDER — HYDROCHLOROTHIAZIDE 12.5 MG PO CAPS: ORAL_CAPSULE | ORAL | 3 refills | Status: DC

## 2020-10-28 MED ORDER — LOSARTAN POTASSIUM 100 MG PO TABS: 100.0000 mg | ORAL_TABLET | Freq: Every day | ORAL | 3 refills | Status: DC

## 2020-10-28 NOTE — Telephone Encounter (Signed)
Refills sent to pharmacy. 

## 2020-11-06 ENCOUNTER — Other Ambulatory Visit: Payer: Self-pay

## 2020-11-06 ENCOUNTER — Ambulatory Visit (INDEPENDENT_AMBULATORY_CARE_PROVIDER_SITE_OTHER): Payer: 59 | Admitting: Cardiology

## 2020-11-06 ENCOUNTER — Encounter: Payer: Self-pay | Admitting: Cardiology

## 2020-11-06 VITALS — BP 160/86 | HR 98 | Ht 71.0 in | Wt 201.8 lb

## 2020-11-06 DIAGNOSIS — E782 Mixed hyperlipidemia: Secondary | ICD-10-CM

## 2020-11-06 DIAGNOSIS — I5032 Chronic diastolic (congestive) heart failure: Secondary | ICD-10-CM | POA: Diagnosis not present

## 2020-11-06 DIAGNOSIS — E785 Hyperlipidemia, unspecified: Secondary | ICD-10-CM | POA: Diagnosis not present

## 2020-11-06 LAB — BASIC METABOLIC PANEL
BUN/Creatinine Ratio: 23 (ref 10–24)
BUN: 25 mg/dL (ref 8–27)
CO2: 25 mmol/L (ref 20–29)
Calcium: 9.3 mg/dL (ref 8.6–10.2)
Chloride: 100 mmol/L (ref 96–106)
Creatinine, Ser: 1.1 mg/dL (ref 0.76–1.27)
GFR calc Af Amer: 83 mL/min/{1.73_m2} (ref >59)
GFR calc non Af Amer: 72 mL/min/{1.73_m2} (ref >59)
Glucose: 107 mg/dL — ABNORMAL HIGH (ref 65–99)
Potassium: 4 mmol/L (ref 3.5–5.2)
Sodium: 139 mmol/L (ref 134–144)

## 2020-11-06 NOTE — Progress Notes (Signed)
Cardiology Office Note:    Date:  11/06/2020   ID:  James Stokes, James Stokes Apr 19, 1956, MRN [id number]  PCP:  Levin Bacon, NP  Cardiologist:  [name], MD  Electrophysiologist:  None   Referring MD: Levin Bacon, NP   No chief complaint on file.   History of Present Illness:    James Stokes is a 65 y.o. male with a hx of COPD and a prior 2 pack a day smoker.  He also had a history of daily alcohol use, 3-5 beers a day.  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  The patient was admitted in November 2019 from the office with respiratory failure.   Echocardiogram revealed preserved LV function with an ejection fraction of 60 to 65% with no regional wall motion abnormalities.  He had grade 1 diastolic dysfunction.  The right atrium was mild to moderately dilated, the right ventricle was mildly dilated and his RV function was mildly reduced.  He was felt to have combined COPD and right heart congestive heart failure and respiratory failure.  He diuresed 10 L in the hospital.  He was placed on inhalers and steroids.  He was followed up by pulmonary as an outpatient, he was discharged at that time on home oxygen.  He is done well since then. He has not smoked since that admission.  He denies any lower extremity edema.  He denies any increasing shortness of breath with exertion.  He saw Dr Debara Pickett last year for dyslipidemia, he has a f/u in March 2022.  He is in the office today for routine f/u.  He is done well since we saw him last, he denies any unusual dyspnea.  He says his alcohol now is down to a rare use.  He is not had any medication issues although he did have some trouble filling his Crestor and has been out of it for a week, this was secondary to a glitch with his insurance company.  Past Medical History:  Diagnosis Date  . Acute diastolic CHF (congestive heart failure) ([place]) 08/22/2018  . Atherosclerotic plaque on cardiac vessels, seen on CT chest 08/22/2018  .  Dyslipidemia   . ETOH abuse   . HTN (hypertension)   . Hypertriglyceridemia   . Kidney stones   . Sleep apnea in adult 08/29/2018   C-pap ordered June 2020- Dr Halford Chessman reviewed the study  . Smoker     Past Surgical History:  Procedure Laterality Date  . HERNIA REPAIR     pt was 11 month old  . HERNIA REPAIR     double hernia pt unsure of exact date, DR. Ballen did surgery  . lipoma  05/1989   fatty tumor on back    Current Medications: Current Meds  Medication Sig  . aspirin 81 MG tablet Take 81 mg by mouth every morning.   . diltiazem (CARDIZEM CD) 300 MG 24 hr capsule Take 1 capsule (300 mg total) by mouth daily.  . hydrochlorothiazide (MICROZIDE) 12.5 MG capsule TAKE 1 CAPSULE BY MOUTH EVERY DAY  . losartan (COZAAR) 100 MG tablet Take 1 tablet (100 mg total) by mouth daily.  . Multiple Vitamins-Minerals (MULTIVITAMIN ADULT PO) Take 1 tablet by mouth every morning.   . rosuvastatin (CRESTOR) 20 MG tablet Take 1 tablet (20 mg total) by mouth daily.     Allergies:   Other and Shrimp [shellfish allergy]   Social History   Socioeconomic History  . Marital status: Married  Spouse name: Not on file  . Number of children: Not on file  . Years of education: Not on file  . Highest education level: Not on file  Occupational History  . Not on file  Tobacco Use  . Smoking status: Former Smoker    Packs/day: 2.00    Years: 43.00    Pack years: 86.00    Types: Cigarettes    Quit date: 08/17/2018    Years since quitting: 2.2  . Smokeless tobacco: Never Used  Vaping Use  . Vaping Use: Never used  Substance and Sexual Activity  . Alcohol use: Yes    Alcohol/week: 6.0 standard drinks    Types: 6 Cans of beer per week    Comment: 1-2 beers a few days a week   . Drug use: Yes    Types: Marijuana  . Sexual activity: Not on file  Other Topics Concern  . Not on file  Social History Narrative  . Not on file   Social Determinants of Health   Financial Resource Strain: Not on  file  Food Insecurity: Not on file  Transportation Needs: Not on file  Physical Activity: Not on file  Stress: Not on file  Social Connections: Not on file     Family History: The patient's family history includes COPD in his mother; Cancer in his father; Diabetes in his paternal aunt and paternal uncle; Hyperlipidemia in his father; Hypertension in his father; Vascular Disease (age of onset: 28) in his father.  ROS:   Please see the history of present illness.     All other systems reviewed and are negative.  EKGs/Labs/Other Studies Reviewed:    The following studies were reviewed today: Echo 08/17/2018- Study Conclusions   - Left ventricle: The cavity size was normal. Wall thickness was  increased in a pattern of mild LVH. Systolic function was normal.  The estimated ejection fraction was in the range of 60% to 65%.  Wall motion was normal; there were no regional wall motion  abnormalities. Doppler parameters are consistent with abnormal  left ventricular relaxation (grade 1 diastolic dysfunction).  - Aortic valve: Valve area (VTI): 2.89 cm^2. Valve area (Vmean):  2.63 cm^2.  - Left atrium: The atrium was mildly dilated.  - Right ventricle: The cavity size was mildly dilated. Systolic  function was mildly reduced.  - Right atrium: The atrium was mildly to moderately dilated.   EKG:  EKG is not ordered today.  The ekg ordered 03/26/2020 demonstrates NSR, HR 86, incomplete RBBB Recent Labs: 11/07/2019: BUN 23; Creatinine, Ser 1.34; Potassium 4.2; Sodium 136 03/26/2020: ALT 44  Recent Lipid Panel    Component Value Date/Time   CHOL 353 (H) 03/26/2020 1014   TRIG 1,768 (HH) 03/26/2020 1014   HDL 21 (L) 03/26/2020 1014   CHOLHDL 16.8 (H) 03/26/2020 1014   CHOLHDL 6.8 (H) 01/25/2017 0901   VLDL NOT CALC 01/25/2017 0901   LDLCALC Comment (A) 03/26/2020 1014   LDLDIRECT 75 01/25/2017 0901    Physical Exam:    VS:  BP (!) 160/86 (BP Location: Left Arm, Patient  Position: Sitting)   Pulse 98   Ht 5\' 11"  (1.803 m)   Wt 201 lb 12.8 oz (91.5 kg)   SpO2 94%   BMI 28.15 kg/m     Wt Readings from Last 3 Encounters:  11/06/20 201 lb 12.8 oz (91.5 kg)  06/06/20 188 lb (85.3 kg)  03/26/20 196 lb 9.6 oz (89.2 kg)     GEN: *Well  nourished, well developed in no acute distress HEENT: Normal NECK: No JVD; No carotid bruits CARDIAC: RRR, no murmurs, rubs, gallops RESPIRATORY:  Clear to auscultation without rales, wheezing or rhonchi  ABDOMEN: Soft, non-tender, non-distended MUSCULOSKELETAL:  No edema; No deformity  SKIN: Warm and dry- he has patchy redness on both LE near his sock line, its been there for > a year, mildly pruritic.  NEUROLOGIC:  Alert and oriented x 3 PSYCHIATRIC:  Normal affect   ASSESSMENT:     Chronic respiratory failure with hypoxia ([place]) Admitted from the office 08/17/18- improved after 10L diuresis (Rt heart failure) and treatment of his COPD  Atherosclerotic plaque on cardiac vessels, seen on CT chest Incidental finding- no history of angina  COPD with hypoxia ([place]) Continues to remain off cigarettes  Essential hypertension High but not usual for him, mild LVH on echo. F/U B/P by me better 138/80  Polycythemia Phlebotomized once- he tells me recent Hgb stable-15 or less  Dyslipidemia- Dr Debara Pickett follows  PLAN:    Same Rx- check BMP today.  Check fasting lipids prior to OV with Dr Debara Pickett in March.  He will see a dermatologist he has seen before for his rash.   Medication Adjustments/Labs and Tests Ordered: Current medicines are reviewed at length with the patient today.  Concerns regarding medicines are outlined above.  No orders of the defined types were placed in this encounter.  No orders of the defined types were placed in this encounter.   There are no Patient Instructions on file for this visit.   Signed, Kerin [facility], PA-C  11/06/2020 10:12 AM    [facility]

## 2020-11-06 NOTE — Patient Instructions (Signed)
Medication Instructions:   Please have labs - lipid done before you see Dr Debara Pickett next month   *If you need a refill on your cardiac medications before your next appointment, please call your pharmacy*   Lab Work: Bmp- today Lipid fasting - by next month   If you have labs (blood work) drawn today and your tests are completely normal, you will receive your results only by: Marland Kitchen MyChart Message (if you have MyChart) OR . A paper copy in the mail If you have any lab test that is abnormal or we need to change your treatment, we will call you to review the results.   Testing/Procedures:  Not needed  Follow-Up: At [facility], you and your health needs are our priority.  As part of our continuing mission to provide you with exceptional heart care, we have created designated James Stokes Care Teams.  These Care Teams include your primary Cardiologist (physician) and Advanced Practice Providers (APPs -  Physician Assistants and Nurse Practitioners) who all work together to provide you with the care you need, when you need it.     Your next appointment:   7 month(s) Sept 2022  The format for your next appointment:   In Person  James Stokes:   James Burow, MD   Other Instructions Keep appointment with Dr Debara Pickett on March 15 , 2022

## 2020-11-13 ENCOUNTER — Encounter: Payer: Self-pay | Admitting: *Deleted

## 2020-11-18 MED ORDER — ROSUVASTATIN CALCIUM 20 MG PO TABS: 20.0000 mg | ORAL_TABLET | Freq: Every day | ORAL | 3 refills | Status: DC

## 2020-11-28 MED ORDER — DILTIAZEM HCL ER COATED BEADS 300 MG PO CP24: 300.0000 mg | ORAL_CAPSULE | Freq: Every day | ORAL | 3 refills | Status: DC

## 2020-11-28 MED ORDER — ROSUVASTATIN CALCIUM 20 MG PO TABS: 20.0000 mg | ORAL_TABLET | Freq: Every day | ORAL | 3 refills | Status: DC

## 2020-11-28 MED ORDER — HYDROCHLOROTHIAZIDE 12.5 MG PO CAPS: ORAL_CAPSULE | ORAL | 3 refills | Status: DC

## 2020-11-28 MED ORDER — LOSARTAN POTASSIUM 100 MG PO TABS: 100.0000 mg | ORAL_TABLET | Freq: Every day | ORAL | 3 refills | Status: DC

## 2020-12-02 LAB — LIPID PANEL
Chol/HDL Ratio: 8.3 ratio — ABNORMAL HIGH (ref 0.0–5.0)
Cholesterol, Total: 257 mg/dL — ABNORMAL HIGH (ref 100–199)
HDL: 31 mg/dL — ABNORMAL LOW (ref >39)
LDL Chol Calc (NIH): 93 mg/dL (ref 0–99)
Triglycerides: 792 mg/dL (ref 0–149)
VLDL Cholesterol Cal: 133 mg/dL — ABNORMAL HIGH (ref 5–40)

## 2020-12-02 LAB — COMPREHENSIVE METABOLIC PANEL
ALT: 31 IU/L (ref 0–44)
AST: 21 IU/L (ref 0–40)
Albumin/Globulin Ratio: 1.7 (ref 1.2–2.2)
Albumin: 4.3 g/dL (ref 3.8–4.8)
Alkaline Phosphatase: 101 IU/L (ref 44–121)
BUN/Creatinine Ratio: 23 (ref 10–24)
BUN: 28 mg/dL — ABNORMAL HIGH (ref 8–27)
Bilirubin Total: 0.3 mg/dL (ref 0.0–1.2)
CO2: 23 mmol/L (ref 20–29)
Calcium: 9.7 mg/dL (ref 8.6–10.2)
Chloride: 98 mmol/L (ref 96–106)
Creatinine, Ser: 1.24 mg/dL (ref 0.76–1.27)
Globulin, Total: 2.6 g/dL (ref 1.5–4.5)
Glucose: 110 mg/dL — ABNORMAL HIGH (ref 65–99)
Potassium: 4.2 mmol/L (ref 3.5–5.2)
Sodium: 134 mmol/L (ref 134–144)
Total Protein: 6.9 g/dL (ref 6.0–8.5)
eGFR: 66 mL/min/{1.73_m2} (ref >59)

## 2020-12-09 ENCOUNTER — Ambulatory Visit (INDEPENDENT_AMBULATORY_CARE_PROVIDER_SITE_OTHER): Payer: 59 | Admitting: Internal Medicine

## 2020-12-09 ENCOUNTER — Encounter: Payer: Self-pay | Admitting: Internal Medicine

## 2020-12-09 ENCOUNTER — Other Ambulatory Visit: Payer: Self-pay

## 2020-12-09 VITALS — BP 148/80 | HR 79 | Ht 71.0 in | Wt 203.4 lb

## 2020-12-09 DIAGNOSIS — E785 Hyperlipidemia, unspecified: Secondary | ICD-10-CM

## 2020-12-09 DIAGNOSIS — E782 Mixed hyperlipidemia: Secondary | ICD-10-CM | POA: Diagnosis not present

## 2020-12-09 NOTE — Patient Instructions (Signed)
Medication Instructions:  Your physician recommends that you continue on your current medications as directed. Please refer to the Current Medication list given to you today.  *If you need a refill on your cardiac medications before your next appointment, please call your pharmacy*  Lab Results: Your physician recommends that you return for lab work in: 3 months  -- complete about 1 week before next visit with Dr. Debara Pickett    Follow-Up: At [facility], you and your health needs are our priority.  As part of our continuing mission to provide you with exceptional heart care, we have created designated Provider Care Teams.  These Care Teams include your primary Cardiologist (physician) and Advanced Practice Providers (APPs -  Physician Assistants and Nurse Practitioners) who all work together to provide you with the care you need, when you need it.  We recommend signing up for the patient portal called "MyChart".  Sign up information is provided on this After Visit Summary.  MyChart is used to connect with patients for Virtual Visits (Telemedicine).  Patients are able to view lab/test results, encounter notes, upcoming appointments, etc.  Non-urgent messages can be sent to your provider as well.   To learn more about what you can do with MyChart, go to [email or web address].    Your next appointment:   3 month(s)  The format for your next appointment:   In Person  Provider:    Dr. Lyman Bishop - lipid clinic    Other Instructions

## 2020-12-09 NOTE — Progress Notes (Signed)
LIPID CLINIC CONSULT NOTE  Chief Complaint:  Follow-up dyslipidemia  Primary Care Physician: Levin Bacon, NP  Primary Cardiologist:  [name], MD  HPI:  James Stokes is a 65 y.o. male who is being seen today for the evaluation of dyslipidemia at the request of Levin Bacon, NP. James Stokes is a 65 y.o. male who presents via audio/video conferencing for a telehealth visit today. James Stokes is a pleasant 65 year old male kindly referred by Dr. Alvester Chou for evaluation and management of dyslipidemia. There is evidence for longstanding hypertriglyceridemia however he has not previously been on any therapies including a statin, fibrate or other lipid-lowering therapy. He was hospitalized in 1610 for COPD/diastolic heart failure exacerbation. He also has a history of some alcohol use which she is cut back significantly. He also stopped smoking 2 years ago. He has had better control of his blood pressure and is compliant with CPAP. 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 He has no known coronary disease and had a negative Myoview last in 2005 but was shown to have coronary calcification by CT in 2019. Recently his lipid profile showed a sharp rise in triglycerides with total cholesterol 353, triglycerides 1768, HDL 21 and LDL could not be calculated. In fact he has had multiple elevated triglycerides on blood work including 2 years ago at 69, 695 the year before that and 853 the year before that. The only direct LDL that I could find was in 2018 which was 76, suggesting this may be a primary hypertriglyceridemia. He reports a varied diet but does have reasonable amount of saturated fat intake and sweets.  12/09/2020  Mr. Stief returns today for follow-up.  Unfortunately he had switched insurances.  This led to him not being able to get his Crestor.  Finally he was able to get it and just started taking it this  past weekend.  This was after his recent lab work was drawn.  That lab work showed total cholesterol 257, HDL 31, LDL 93 and triglycerides 792.  This is a significant improvement from triglycerides at 1768 in the past.  He may be more active or has made some dietary changes since then.  PMHx:  Past Medical History:  Diagnosis Date  . Acute diastolic CHF (congestive heart failure) ([place]) 08/22/2018  . Atherosclerotic plaque on cardiac vessels, seen on CT chest 08/22/2018  . Dyslipidemia   . ETOH abuse   . HTN (hypertension)   . Hypertriglyceridemia   . Kidney stones   . Sleep apnea in adult 08/29/2018   C-pap ordered June 2020- Dr Halford Chessman reviewed the study  . Smoker     Past Surgical History:  Procedure Laterality Date  . HERNIA REPAIR     pt was 13 month old  . HERNIA REPAIR     double hernia pt unsure of exact date, DR. Ballen did surgery  . lipoma  05/1989   fatty tumor on back    FAMHx:  Family History  Problem Relation Age of Onset  . COPD Mother   . Vascular Disease Father 53       aneurysm  . Hyperlipidemia Father   . Hypertension Father   . Cancer Father   . Diabetes Paternal Aunt   . Diabetes Paternal Uncle     SOCHx:   reports that he quit smoking about 2 years ago. His smoking use included cigarettes. He has  a 86.00 pack-year smoking history. He has never used smokeless tobacco. He reports current alcohol use of about 6.0 standard drinks of alcohol per week. He reports current drug use. Drug: Marijuana.  ALLERGIES:  Allergies  Allergen Reactions  . Other Hives and Itching  . Shrimp [Shellfish Allergy] Itching and Rash    ROS: Pertinent items noted in HPI and remainder of comprehensive ROS otherwise negative.  HOME MEDS: Current Outpatient Medications on File Prior to Visit  Medication Sig Dispense Refill  . albuterol (PROVENTIL HFA;VENTOLIN HFA) 108 (90 Base) MCG/ACT inhaler Inhale 2 puffs into the lungs every 6 (six) hours as needed for wheezing or  shortness of breath (cough, shortness of breath or wheezing.). 1 Inhaler 1  . aspirin 81 MG tablet Take 81 mg by mouth every morning.     . diltiazem (CARDIZEM CD) 300 MG 24 hr capsule Take 1 capsule (300 mg total) by mouth daily. 90 capsule 3  . hydrochlorothiazide (MICROZIDE) 12.5 MG capsule TAKE 1 CAPSULE BY MOUTH EVERY DAY 90 capsule 3  . losartan (COZAAR) 100 MG tablet Take 1 tablet (100 mg total) by mouth daily. 90 tablet 3  . Multiple Vitamins-Minerals (MULTIVITAMIN ADULT PO) Take 1 tablet by mouth every morning.     . rosuvastatin (CRESTOR) 20 MG tablet Take 1 tablet (20 mg total) by mouth daily. 90 tablet 3   No current facility-administered medications on file prior to visit.    LABS/IMAGING: No results found for this or any previous visit (from the past 48 hour(s)). No results found.  LIPID PANEL:    Component Value Date/Time   CHOL 257 (H) 12/02/2020 0826   TRIG 792 (HH) 12/02/2020 0826   HDL 31 (L) 12/02/2020 0826   CHOLHDL 8.3 (H) 12/02/2020 0826   CHOLHDL 6.8 (H) 01/25/2017 0901   VLDL NOT CALC 01/25/2017 0901   LDLCALC 93 12/02/2020 0826   LDLDIRECT 75 01/25/2017 0901    WEIGHTS: Wt Readings from Last 3 Encounters:  12/09/20 203 lb 6.4 oz (92.3 kg)  11/06/20 201 lb 12.8 oz (91.5 kg)  06/06/20 188 lb (85.3 kg)    VITALS: BP (!) 148/80   Pulse 79   Ht 5\' 11"  (1.803 m)   Wt 203 lb 6.4 oz (92.3 kg)   SpO2 97%   BMI 28.37 kg/m   EXAM: Deferred  EKG: Deferred  ASSESSMENT: 1. Mixed dyslipidemia with primarily high triglycerides and low HDL 2. Coronary artery calcification 3. COPD, severe 4. History of diastolic heart failure  PLAN: 1.   Mr. [name] has persistently elevated triglycerides but they are thousand points lower than they have been in the past.  He may have decrease of alcohol use, change his diet and been more active.  He has not started the rosuvastatin since I saw him because of issues with his mail order pharmacy.  He just started this  past weekend so we should then repeat his lipids in another 3 months.  We could consider additions at that point.  LDL is actually lower as well.  Follow-up with me at that time.  Pixie Casino, MD, [facility], [place] Director of the Advanced Lipid Disorders &  Cardiovascular Risk Reduction Clinic Diplomate of the American Board of Clinical Lipidology Attending Cardiologist  Direct Dial: [phone]  Fax: [phone]  Website:  [email or web address] 12/09/2020, 8:42 AM

## 2021-02-07 ENCOUNTER — Encounter (HOSPITAL_BASED_OUTPATIENT_CLINIC_OR_DEPARTMENT_OTHER): Payer: Self-pay | Admitting: *Deleted

## 2021-02-07 ENCOUNTER — Other Ambulatory Visit: Payer: Self-pay

## 2021-02-07 ENCOUNTER — Emergency Department (HOSPITAL_BASED_OUTPATIENT_CLINIC_OR_DEPARTMENT_OTHER)
Admission: EM | Admit: 2021-02-07 | Discharge: 2021-02-07 | Disposition: A | Discharge status: Home / Self Care | Payer: 59 | Attending: Emergency Medicine | Admitting: Emergency Medicine

## 2021-02-07 ENCOUNTER — Ambulatory Visit: Admission: EM | Admit: 2021-02-07 | Discharge: 2021-02-07 | Discharge status: Against Medical Advice | Payer: 59

## 2021-02-07 ENCOUNTER — Emergency Department (HOSPITAL_BASED_OUTPATIENT_CLINIC_OR_DEPARTMENT_OTHER): Payer: 59

## 2021-02-07 DIAGNOSIS — Z79899 Other long term (current) drug therapy: Secondary | ICD-10-CM | POA: Insufficient documentation

## 2021-02-07 DIAGNOSIS — Z87891 Personal history of nicotine dependence: Secondary | ICD-10-CM | POA: Diagnosis not present

## 2021-02-07 DIAGNOSIS — Z7982 Long term (current) use of aspirin: Secondary | ICD-10-CM | POA: Insufficient documentation

## 2021-02-07 DIAGNOSIS — M7989 Other specified soft tissue disorders: Secondary | ICD-10-CM | POA: Insufficient documentation

## 2021-02-07 DIAGNOSIS — M25532 Pain in left wrist: Secondary | ICD-10-CM | POA: Diagnosis not present

## 2021-02-07 DIAGNOSIS — I5031 Acute diastolic (congestive) heart failure: Secondary | ICD-10-CM | POA: Insufficient documentation

## 2021-02-07 DIAGNOSIS — I110 Hypertensive heart disease with heart failure: Secondary | ICD-10-CM | POA: Insufficient documentation

## 2021-02-07 DIAGNOSIS — J449 Chronic obstructive pulmonary disease, unspecified: Secondary | ICD-10-CM | POA: Diagnosis not present

## 2021-02-07 MED ORDER — OXYCODONE-ACETAMINOPHEN 5-325 MG PO TABS: 1.0000 | ORAL_TABLET | Freq: Three times a day (TID) | ORAL | 0 refills | Status: DC | PRN

## 2021-02-07 NOTE — Discharge Instructions (Signed)
If symptoms do not improve follow up with the Hand Surgeon

## 2021-02-07 NOTE — ED Notes (Signed)
Pt d/c by primary RN
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
## 2021-04-16 LAB — LIPID PANEL
Chol/HDL Ratio: 4.2 ratio (ref 0.0–5.0)
Cholesterol, Total: 135 mg/dL (ref 100–199)
HDL: 32 mg/dL — ABNORMAL LOW (ref >39)
LDL Chol Calc (NIH): 41 mg/dL (ref 0–99)
Triglycerides: 427 mg/dL — ABNORMAL HIGH (ref 0–149)
VLDL Cholesterol Cal: 62 mg/dL — ABNORMAL HIGH (ref 5–40)

## 2021-04-23 ENCOUNTER — Ambulatory Visit (INDEPENDENT_AMBULATORY_CARE_PROVIDER_SITE_OTHER): Payer: 59 | Admitting: Internal Medicine

## 2021-04-23 ENCOUNTER — Telehealth: Payer: Self-pay | Admitting: Internal Medicine

## 2021-04-23 ENCOUNTER — Encounter: Payer: Self-pay | Admitting: Internal Medicine

## 2021-04-23 ENCOUNTER — Other Ambulatory Visit: Payer: Self-pay

## 2021-04-23 VITALS — BP 136/84 | HR 85 | Ht 71.0 in | Wt 204.0 lb

## 2021-04-23 DIAGNOSIS — E785 Hyperlipidemia, unspecified: Secondary | ICD-10-CM

## 2021-04-23 DIAGNOSIS — J449 Chronic obstructive pulmonary disease, unspecified: Secondary | ICD-10-CM

## 2021-04-23 DIAGNOSIS — E782 Mixed hyperlipidemia: Secondary | ICD-10-CM

## 2021-04-23 DIAGNOSIS — I7090 Unspecified atherosclerosis: Secondary | ICD-10-CM

## 2021-04-23 DIAGNOSIS — R0902 Hypoxemia: Secondary | ICD-10-CM

## 2021-04-23 MED ORDER — FENOFIBRATE 145 MG PO TABS: 145.0000 mg | ORAL_TABLET | Freq: Every day | ORAL | 3 refills | Status: DC

## 2021-04-23 NOTE — Telephone Encounter (Signed)
Resent refills over to the new pharmacy.

## 2021-04-23 NOTE — Patient Instructions (Signed)
Medication Instructions:  START fenofibrate '145mg'$  daily CONTINUE all other current medications  *If you need a refill on your cardiac medications before your next appointment, please call your pharmacy*   Lab Work: FASTING lab work in about 3 months to check cholesterol  -- complete about 1 week before your next appointment  If you have labs (blood work) drawn today and your tests are completely normal, you will receive your results only by: [place] (if you have MyChart) OR A paper copy in the mail If you have any lab test that is abnormal or we need to change your treatment, we will call you to review the results.   Testing/Procedures: NONE   Follow-Up: At [facility], you and your health needs are our priority.  As part of our continuing mission to provide you with exceptional heart care, we have created designated Provider Care Teams.  These Care Teams include your primary Cardiologist (physician) and Advanced Practice Providers (APPs -  Physician Assistants and Nurse Practitioners) who all work together to provide you with the care you need, when you need it.  We recommend signing up for the patient portal called "MyChart".  Sign up information is provided on this After Visit Summary.  MyChart is used to connect with patients for Virtual Visits (Telemedicine).  Patients are able to view lab/test results, encounter notes, upcoming appointments, etc.  Non-urgent messages can be sent to your provider as well.   To learn more about what you can do with MyChart, go to [email or web address].    Your next appointment:   3-4 month(s)  The format for your next appointment:   In Person  Provider:   Dr. Debara Pickett - lipid clinic   Other Instructions

## 2021-04-23 NOTE — Progress Notes (Signed)
LIPID CLINIC CONSULT NOTE  Chief Complaint:  Follow-up dyslipidemia  Primary Care Physician: Levin Bacon, NP  Primary Cardiologist:  [name], MD  HPI:  James Stokes is a 65 y.o. male who is being seen today for the evaluation of dyslipidemia at the request of Levin Bacon, NP. James Stokes is a 65 y.o. male who presents via audio/video conferencing for a telehealth visit today. James Stokes is a pleasant 65 year old male kindly referred by Dr. Alvester Chou for evaluation and management of dyslipidemia. There is evidence for longstanding hypertriglyceridemia however he has not previously been on any therapies including a statin, fibrate or other lipid-lowering therapy. He was hospitalized in [id number] for COPD/diastolic heart failure exacerbation. He also has a history of some alcohol use which she is cut back significantly. He also stopped smoking 2 years ago. He has had better control of his blood pressure and is compliant with CPAP. 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 He has no known coronary disease and had a negative Myoview last in 2005 but was shown to have coronary calcification by CT in 2019. Recently his lipid profile showed a sharp rise in triglycerides with total cholesterol 353, triglycerides 1768, HDL 21 and LDL could not be calculated. In fact he has had multiple elevated triglycerides on blood work including 2 years ago at 71, 695 the year before that and 853 the year before that. The only direct LDL that I could find was in 2018 which was 76, suggesting this may be a primary hypertriglyceridemia. He reports a varied diet but does have reasonable amount of saturated fat intake and sweets.  12/09/2020  Mr. Licea returns today for follow-up.  Unfortunately he had switched insurances.  This led to him not being able to get his Crestor.  Finally he was able to get it and just started taking it this  past weekend.  This was after his recent lab work was drawn.  That lab work showed total cholesterol 257, HDL 31, LDL 93 and triglycerides 792.  This is a significant improvement from triglycerides at 1768 in the past.  He may be more active or has made some dietary changes since then.  04/23/2021  Mr. Flocco is seen today for follow-up.  He continues to have marked improvement in his lipids.  He is tolerating the rosuvastatin without issues.  Cholesterol has come down significantly with total now 135, HDL 32, LDL 41 and triglycerides 427.  Previously triglycerides were 792 and prior to that 1768.  He has had no episodes of pancreatitis.  He says he is continuing to work on his diet.  PMHx:  Past Medical History:  Diagnosis Date   Acute diastolic CHF (congestive heart failure) ([place]) 08/22/2018   Atherosclerotic plaque on cardiac vessels, seen on CT chest 08/22/2018   Dyslipidemia    ETOH abuse    HTN (hypertension)    Hypertriglyceridemia    Kidney stones    Sleep apnea in adult 08/29/2018   C-pap ordered June 2020- Dr Halford Chessman reviewed the study   Smoker     Past Surgical History:  Procedure Laterality Date   HERNIA REPAIR     pt was 12 month old   [place]     double hernia pt unsure of exact date, DR. Ballen did surgery   lipoma  05/1989   fatty tumor on back    FAMHx:  Family  History  Problem Relation Age of Onset   COPD Mother    Vascular Disease Father 35       aneurysm   Hyperlipidemia Father    Hypertension Father    Cancer Father    Diabetes Paternal Aunt    Diabetes Paternal Uncle     SOCHx:   reports that he quit smoking about 2 years ago. His smoking use included cigarettes. He has a 86.00 pack-year smoking history. He has never used smokeless tobacco. He reports current alcohol use of about 6.0 standard drinks of alcohol per week. He reports current drug use. Drug: Marijuana.  ALLERGIES:  Allergies  Allergen Reactions   Other Hives and Itching   Shrimp  [Shellfish Allergy] Itching and Rash    ROS: Pertinent items noted in HPI and remainder of comprehensive ROS otherwise negative.  HOME MEDS: Current Outpatient Medications on File Prior to Visit  Medication Sig Dispense Refill   albuterol (PROVENTIL HFA;VENTOLIN HFA) 108 (90 Base) MCG/ACT inhaler Inhale 2 puffs into the lungs every 6 (six) hours as needed for wheezing or shortness of breath (cough, shortness of breath or wheezing.). 1 Inhaler 1   aspirin 81 MG tablet Take 81 mg by mouth every morning.      diltiazem (CARDIZEM CD) 300 MG 24 hr capsule Take 1 capsule (300 mg total) by mouth daily. 90 capsule 3   hydrochlorothiazide (MICROZIDE) 12.5 MG capsule TAKE 1 CAPSULE BY MOUTH EVERY DAY 90 capsule 3   losartan (COZAAR) 100 MG tablet Take 1 tablet (100 mg total) by mouth daily. 90 tablet 3   Multiple Vitamins-Minerals (MULTIVITAMIN ADULT PO) Take 1 tablet by mouth every morning.      rosuvastatin (CRESTOR) 20 MG tablet Take 1 tablet (20 mg total) by mouth daily. 90 tablet 3   No current facility-administered medications on file prior to visit.    LABS/IMAGING: No results found for this or any previous visit (from the past 48 hour(s)). No results found.  LIPID PANEL:    Component Value Date/Time   CHOL 135 04/16/2021 0935   TRIG 427 (H) 04/16/2021 0935   HDL 32 (L) 04/16/2021 0935   CHOLHDL 4.2 04/16/2021 0935   CHOLHDL 6.8 (H) 01/25/2017 0901   VLDL NOT CALC 01/25/2017 0901   LDLCALC 41 04/16/2021 0935   LDLDIRECT 75 01/25/2017 0901    WEIGHTS: Wt Readings from Last 3 Encounters:  04/23/21 204 lb (92.5 kg)  12/09/20 203 lb 6.4 oz (92.3 kg)  11/06/20 201 lb 12.8 oz (91.5 kg)    VITALS: BP 136/84   Pulse 85   Ht '5\' 11"'$  (1.803 m)   Wt 204 lb (92.5 kg)   SpO2 94%   BMI 28.45 kg/m   EXAM: Deferred  EKG: Deferred  ASSESSMENT: Mixed dyslipidemia with primarily high triglycerides and low HDL Coronary artery calcification COPD, severe History of diastolic  heart failure  PLAN: 1.   Mr. Stoeckel continues to have high triglycerides however has had a significant improvement.  His triglycerides are now down to level as they were about 3 years ago.  I think he is safely out of the range of pancreatitis however could stand improvement in his numbers.  I would advise adding fenofibrate 145 mg daily.  He will continue to work on his diet.  We will repeat lipids in about 3 months and follow-up at that time.  Pixie Casino, MD, [facility], [place] Director of the Advanced Lipid Disorders &  Cardiovascular Risk Reduction Clinic Diplomate of the American Board of Clinical Lipidology Attending Cardiologist  Direct Dial: [phone]  Fax: [phone]  Website:  [email or web address]  Nadean Corwin Larene Ascencio 04/23/2021, 8:15 AM

## 2021-04-23 NOTE — Telephone Encounter (Signed)
*  STAT* If patient is at the pharmacy, call can be transferred to refill team.   1. Which medications need to be refilled? (please list name of each medication and dose if known) fenofibrate (TRICOR) 145 MG tablet  2. Which pharmacy/location (including street and city if local pharmacy) is medication to be sent to? [place], quantity [facility] ([place], [place]  3. Do they need a 30 day or 90 day supply? 90  Because of patient new insurance the medication needs to be send to up above address

## 2021-04-30 ENCOUNTER — Telehealth: Payer: Self-pay | Admitting: Internal Medicine

## 2021-04-30 NOTE — Telephone Encounter (Signed)
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  He will need to go to urgent care if he feels symptomatic enough to need antivirals.

## 2021-04-30 NOTE — Telephone Encounter (Signed)
Pt is caling he has COVID and wants to know what he can take

## 2021-04-30 NOTE — Telephone Encounter (Signed)
Spoke with the patient who states that he has COVID and wanted to know what he could take for it. I advised patient on over the counter recommendations. He would like to know about prescription medications. I advised  him to reach out to his PCP. He states that he has not seen a PCP in over 4 years.

## 2021-05-01 ENCOUNTER — Ambulatory Visit
Admission: EM | Admit: 2021-05-01 | Discharge: 2021-05-01 | Disposition: A | Discharge status: Home / Self Care | Payer: 59 | Attending: Urgent Care | Admitting: Urgent Care

## 2021-05-01 ENCOUNTER — Other Ambulatory Visit: Payer: Self-pay

## 2021-05-01 ENCOUNTER — Encounter: Payer: Self-pay | Admitting: Emergency Medicine

## 2021-05-01 ENCOUNTER — Ambulatory Visit (INDEPENDENT_AMBULATORY_CARE_PROVIDER_SITE_OTHER): Payer: 59

## 2021-05-01 DIAGNOSIS — Z8679 Personal history of other diseases of the circulatory system: Secondary | ICD-10-CM

## 2021-05-01 DIAGNOSIS — J9611 Chronic respiratory failure with hypoxia: Secondary | ICD-10-CM

## 2021-05-01 DIAGNOSIS — R059 Cough, unspecified: Secondary | ICD-10-CM

## 2021-05-01 DIAGNOSIS — Z87891 Personal history of nicotine dependence: Secondary | ICD-10-CM

## 2021-05-01 DIAGNOSIS — U071 COVID-19: Secondary | ICD-10-CM

## 2021-05-01 DIAGNOSIS — R0602 Shortness of breath: Secondary | ICD-10-CM

## 2021-05-01 DIAGNOSIS — R5383 Other fatigue: Secondary | ICD-10-CM

## 2021-05-01 DIAGNOSIS — J449 Chronic obstructive pulmonary disease, unspecified: Secondary | ICD-10-CM

## 2021-05-01 MED ORDER — NIRMATRELVIR & RITONAVIR 10 X 150 MG & 10 X 100MG PO TBPK: ORAL_TABLET | ORAL | 0 refills | Status: DC

## 2021-05-01 MED ORDER — PROMETHAZINE-DM 6.25-15 MG/5ML PO SYRP: 5.0000 mL | ORAL_SOLUTION | Freq: Every evening | ORAL | 0 refills | Status: DC | PRN

## 2021-05-01 MED ORDER — BENZONATATE 100 MG PO CAPS: 100.0000 mg | ORAL_CAPSULE | Freq: Three times a day (TID) | ORAL | 0 refills | Status: DC | PRN

## 2021-05-01 NOTE — Telephone Encounter (Signed)
Spoke with the patient and advised him to go to an urgent care for treatment. Patient verbalized understanding.

## 2021-05-01 NOTE — ED Triage Notes (Signed)
Hx of emphysema, COPD, RHF. Presents wearing 3L home O2. C/o SOB, fatigue, more productive cough since Wednesday. Positive covid test yesterday morning. Requesting medication "to help it go away faster."

## 2021-05-01 NOTE — ED Provider Notes (Signed)
[place]   MRN: [id number] DOB: 08-19-1956  Subjective:   Josel Shank is a 65 y.o. male presenting for 2-day history of acute onset cough, fatigue.  Patient tested himself for COVID-19 yesterday and was positive.  His wife is actually had COVID for the past week.  No COVID vaccination for the patient.  He has had some intermittent shortness of breath but is not new for him as he has COPD with hypoxia, chronic respiratory failure with hypoxia and is on 2 L of oxygen at home.  He only uses albuterol as his inhaler, does not use it often.  He is a former smoker.  He also has a history of diastolic heart failure.  No current facility-administered medications for this encounter.  Current Outpatient Medications:    albuterol (PROVENTIL HFA;VENTOLIN HFA) 108 (90 Base) MCG/ACT inhaler, Inhale 2 puffs into the lungs every 6 (six) hours as needed for wheezing or shortness of breath (cough, shortness of breath or wheezing.)., Disp: 1 Inhaler, Rfl: 1   aspirin 81 MG tablet, Take 81 mg by mouth every morning. , Disp: , Rfl:    diltiazem (CARDIZEM CD) 300 MG 24 hr capsule, Take 1 capsule (300 mg total) by mouth daily., Disp: 90 capsule, Rfl: 3   fenofibrate (TRICOR) 145 MG tablet, Take 1 tablet (145 mg total) by mouth daily., Disp: 90 tablet, Rfl: 3   hydrochlorothiazide (MICROZIDE) 12.5 MG capsule, TAKE 1 CAPSULE BY MOUTH EVERY DAY, Disp: 90 capsule, Rfl: 3   losartan (COZAAR) 100 MG tablet, Take 1 tablet (100 mg total) by mouth daily., Disp: 90 tablet, Rfl: 3   Multiple Vitamins-Minerals (MULTIVITAMIN ADULT PO), Take 1 tablet by mouth every morning. , Disp: , Rfl:    rosuvastatin (CRESTOR) 20 MG tablet, Take 1 tablet (20 mg total) by mouth daily., Disp: 90 tablet, Rfl: 3   Allergies  Allergen Reactions   Other Hives and Itching   Shrimp [Shellfish Allergy] Itching and Rash    Past Medical History:  Diagnosis Date   Acute diastolic CHF (congestive heart failure) ([place])  08/22/2018   Atherosclerotic plaque on cardiac vessels, seen on CT chest 08/22/2018   Dyslipidemia    ETOH abuse    HTN (hypertension)    Hypertriglyceridemia    Kidney stones    Sleep apnea in adult 08/29/2018   C-pap ordered June 2020- Dr Halford Chessman reviewed the study   Smoker      Past Surgical History:  Procedure Laterality Date   HERNIA REPAIR     pt was 75 month old   [place]     double hernia pt unsure of exact date, DR. Ballen did surgery   lipoma  05/1989   fatty tumor on back    Family History  Problem Relation Age of Onset   COPD Mother    Vascular Disease Father 90       aneurysm   Hyperlipidemia Father    Hypertension Father    Cancer Father    Diabetes Paternal Aunt    Diabetes Paternal Uncle     Social History   Tobacco Use   Smoking status: Former    Packs/day: 2.00    Years: 43.00    Pack years: 86.00    Types: Cigarettes    Quit date: 08/17/2018    Years since quitting: 2.7   Smokeless tobacco: Never  Vaping Use   Vaping Use: Never used  Substance Use Topics   Alcohol use: Yes  Alcohol/week: 6.0 standard drinks    Types: 6 Cans of beer per week    Comment: 1-2 beers a few days a week    Drug use: Yes    Types: Marijuana    ROS   Objective:   Vitals: BP 140/83 (BP Location: Left Arm)   Pulse 96   Temp 98.4 F (36.9 C) (Oral)   Resp 18   SpO2 93%   Physical Exam Constitutional:      General: He is not in acute distress.    Appearance: Normal appearance. He is well-developed. He is not ill-appearing, toxic-appearing or diaphoretic.  HENT:     Head: Normocephalic and atraumatic.     Right Ear: External ear normal.     Left Ear: External ear normal.     Nose: Nose normal.     Mouth/Throat:     Mouth: Mucous membranes are moist.     Pharynx: Oropharynx is clear.  Eyes:     General: No scleral icterus.    Extraocular Movements: Extraocular movements intact.     Pupils: Pupils are equal, round, and reactive to light.   Cardiovascular:     Rate and Rhythm: Normal rate and regular rhythm.     Heart sounds: Normal heart sounds. No murmur heard.   No friction rub. No gallop.  Pulmonary:     Effort: Pulmonary effort is normal. No respiratory distress.     Breath sounds: No stridor. Decreased breath sounds present. No wheezing, rhonchi or rales.  Neurological:     Mental Status: He is alert and oriented to person, place, and time.  Psychiatric:        Mood and Affect: Mood normal.        Behavior: Behavior normal.        Thought Content: Thought content normal.    DG Chest 2 View  Result Date: 05/01/2021 CLINICAL DATA:  Shortness of breath EXAM: CHEST - 2 VIEW COMPARISON:  08/17/2018 FINDINGS: Cardiomediastinal contours are within normal limits. Mild hyperinflation with mildly prominent interstitial markings bilaterally, similar to prior. No focal airspace consolidation, pleural effusion, or pneumothorax. IMPRESSION: 1. No active cardiopulmonary disease. 2. COPD. Electronically Signed   By: Davina Poke D.O.   On: 05/01/2021 15:41     Assessment and Plan :   PDMP not reviewed this encounter.  1. Clinical diagnosis of COVID-19   2. COVID-19   3. Cough   4. Other fatigue   5. Chronic obstructive pulmonary disease, unspecified COPD type ([place])   6. Chronic respiratory failure with hypoxia ([facility])   7. Former smoker   8. History of congestive heart failure     Patient has multiple risk factors to do poorly with COVID-19.  Chart review from his regular checkups revealed that he has had really good kidney function.  Last draw was in March and was completely normal.  As such, prescribed him Paxlovid.  We will update his basic metabolic panel.  Confirmation COVID testing pending.  He is supportive care otherwise.  Counseled patient on potential for adverse effects with medications prescribed/recommended today, ER and return-to-clinic precautions discussed, patient verbalized understanding.    Jaynee Eagles,  [place] 05/01/21 [phone]

## 2021-05-02 LAB — NOVEL CORONAVIRUS, NAA: SARS-CoV-2, NAA: DETECTED — AB

## 2021-05-02 LAB — BASIC METABOLIC PANEL
BUN/Creatinine Ratio: 18 (ref 10–24)
BUN: 23 mg/dL (ref 8–27)
CO2: 21 mmol/L (ref 20–29)
Calcium: 8.9 mg/dL (ref 8.6–10.2)
Chloride: 100 mmol/L (ref 96–106)
Creatinine, Ser: 1.27 mg/dL (ref 0.76–1.27)
Glucose: 100 mg/dL — ABNORMAL HIGH (ref 65–99)
Potassium: 4.4 mmol/L (ref 3.5–5.2)
Sodium: 140 mmol/L (ref 134–144)
eGFR: 64 mL/min/{1.73_m2} (ref >59)

## 2021-05-02 LAB — SARS-COV-2, NAA 2 DAY TAT

## 2021-06-04 ENCOUNTER — Encounter: Payer: Self-pay | Admitting: Nurse Practitioner

## 2021-06-04 ENCOUNTER — Ambulatory Visit (INDEPENDENT_AMBULATORY_CARE_PROVIDER_SITE_OTHER): Payer: 59 | Admitting: Nurse Practitioner

## 2021-06-04 ENCOUNTER — Other Ambulatory Visit: Payer: Self-pay

## 2021-06-04 VITALS — BP 137/76 | HR 83 | Temp 98.3°F | Ht 71.0 in | Wt 201.9 lb

## 2021-06-04 DIAGNOSIS — E785 Hyperlipidemia, unspecified: Secondary | ICD-10-CM

## 2021-06-04 DIAGNOSIS — I10 Essential (primary) hypertension: Secondary | ICD-10-CM

## 2021-06-04 DIAGNOSIS — Z7689 Persons encountering health services in other specified circumstances: Secondary | ICD-10-CM | POA: Diagnosis not present

## 2021-06-04 DIAGNOSIS — M19042 Primary osteoarthritis, left hand: Secondary | ICD-10-CM | POA: Diagnosis not present

## 2021-06-04 DIAGNOSIS — J449 Chronic obstructive pulmonary disease, unspecified: Secondary | ICD-10-CM | POA: Diagnosis not present

## 2021-06-04 DIAGNOSIS — R0902 Hypoxemia: Secondary | ICD-10-CM

## 2021-06-04 DIAGNOSIS — I7090 Unspecified atherosclerosis: Secondary | ICD-10-CM

## 2021-06-04 NOTE — Progress Notes (Signed)
New Patient Office Visit  Subjective:  Patient ID: James Stokes, male    DOB: [birth year]  Age: 65 y.o. MRN: [id number]  CC:  Chief Complaint  Patient presents with   New Patient (Initial Visit)    HPI James Stokes presents to establish new primary care provider.  He did see a cardiologist and lipid specialist on a routine basis.  Patient is a former smoker.  Quit in 2019.  Quit after hospitalization in 2019 due to shortness of breath.  States he was hospitalized at that time for 6 days.  Was diagnosed with emphysema, COPD, and right heart failure.  He states he use the nicotine patch to help him quit.  States that he has felt much better in general since he quit smoking.  He does still have some shortness of breath and sinus issues.  He has to use nasal cannula oxygen at night.  Overall feels well. He does have some arm and wrist tenderness which is worse on the left side than the right.  He states it is intermittent and worse with exertion.  He did have x-rays done at [facility].  Diagnosed with arthritic pain.  Given anti-inflammatory medication which he is on a few times.  He denies pain in shoulder or the elbows.  States that in the past he has torn the biceps tendon of his right arm.  But this does not bother him as much as the left.  Currently the patient has intact range of motion and strength of both arms. The patient is due for a routine wellness visit and routine labs.  Lipid panel is drawn routinely through cardiology/lipid clinic.  Past Medical History:  Diagnosis Date   Acute diastolic CHF (congestive heart failure) ([place]) 08/22/2018   Atherosclerotic plaque on cardiac vessels, seen on CT chest 08/22/2018   Dyslipidemia    ETOH abuse    HTN (hypertension)    Hypertriglyceridemia    Kidney stones    Sleep apnea in adult 08/29/2018   C-pap ordered June 2020- Dr Halford Chessman reviewed the study   Smoker     Past Surgical History:  Procedure Laterality Date   HERNIA  REPAIR     pt was 49 month old   [place]     double hernia pt unsure of exact date, DR. Ballen did surgery   lipoma  05/1989   fatty tumor on back    Family History  Problem Relation Age of Onset   COPD Mother    Vascular Disease Father 70       aneurysm   Hyperlipidemia Father    Hypertension Father    Cancer Father    Diabetes Paternal Aunt    Diabetes Paternal Uncle     Social History   Socioeconomic History   Marital status: Married    Spouse name: Not on file   Number of children: Not on file   Years of education: Not on file   Highest education level: Not on file  Occupational History   Not on file  Tobacco Use   Smoking status: Former    Packs/day: 2.00    Years: 43.00    Pack years: 86.00    Types: Cigarettes    Quit date: 08/17/2018    Years since quitting: 2.8   Smokeless tobacco: Never  Vaping Use   Vaping Use: Never used  Substance and Sexual Activity   Alcohol use: Yes    Alcohol/week: 6.0 standard drinks  Types: 6 Cans of beer per week    Comment: 1-2 beers a few days a week    Drug use: Yes    Types: Marijuana   Sexual activity: Not on file  Other Topics Concern   Not on file  Social History Narrative   Not on file   Social Determinants of Health   Financial Resource Strain: Not on file  Food Insecurity: Not on file  Transportation Needs: Not on file  Physical Activity: Not on file  Stress: Not on file  Social Connections: Not on file  Intimate Partner Violence: Not on file    ROS Review of Systems  Constitutional:  Negative for activity change, chills, fatigue and fever.  HENT:  Negative for congestion, postnasal drip, rhinorrhea, sinus pressure, sinus pain, sneezing and sore throat.   Eyes: Negative.   Respiratory:  Positive for shortness of breath. Negative for cough and wheezing.   Cardiovascular:  Negative for chest pain and palpitations.  Gastrointestinal:  Negative for constipation, diarrhea, nausea and vomiting.   Endocrine: Negative for cold intolerance, heat intolerance, polydipsia and polyuria.  Genitourinary:  Negative for dysuria, frequency and urgency.  Musculoskeletal:  Positive for arthralgias and myalgias. Negative for back pain.       Patient does have some tenderness in the left arm and left wrist.  Pain is actually bilateral but worse on the left than the right.  Skin:  Negative for rash.  Allergic/Immunologic: Negative for environmental allergies.  Neurological:  Negative for dizziness, weakness and headaches.  Psychiatric/Behavioral:  The patient is not nervous/anxious.    Objective:   Today's Vitals   06/04/21 1612  BP: 137/76  Pulse: 83  Temp: 98.3 F (36.8 C)  SpO2: 93%  Weight: 201 lb 14.4 oz (91.6 kg)  Height: '5\' 11"'$  (1.803 m)   Body mass index is 28.16 kg/m.   Physical Exam Vitals and nursing note reviewed.  Constitutional:      Appearance: Normal appearance. He is well-developed.  HENT:     Head: Normocephalic and atraumatic.  Eyes:     Pupils: Pupils are equal, round, and reactive to light.  Cardiovascular:     Rate and Rhythm: Normal rate and regular rhythm.     Pulses: Normal pulses.     Heart sounds: Normal heart sounds.  Pulmonary:     Effort: Pulmonary effort is normal.     Breath sounds: Normal breath sounds.  Abdominal:     Palpations: Abdomen is soft.  Musculoskeletal:        General: Normal range of motion.     Cervical back: Normal range of motion and neck supple.  Lymphadenopathy:     Cervical: No cervical adenopathy.  Skin:    General: Skin is warm and dry.     Capillary Refill: Capillary refill takes less than 2 seconds.  Neurological:     General: No focal deficit present.     Mental Status: He is alert and oriented to person, place, and time.  Psychiatric:        Mood and Affect: Mood normal.        Behavior: Behavior normal.        Thought Content: Thought content normal.        Judgment: Judgment normal.    Assessment & Plan:   1. Encounter to establish care Appointment today to establish new primary care provider.  2. Primary osteoarthritis of left hand Encourage patient to use Tylenol and or anti-inflammatory medication to treat pain.  Encouraged him to stay active and perform range of motion activities to keep joints fluid.  We will do further imaging as indicated if pain persists or gets worse.  3. COPD with hypoxia (HCC) Nasal cannula oxygen at night.  Managed per cardiology at this time.  4. Essential hypertension Pressure stable.  Continue medical prescribed.  5. Atherosclerotic plaque on cardiac vessels, seen on CT chest Patient followed closely per cardiology and lipid clinic.  6. Dyslipidemia Reviewing labs, triglycerides still moderately elevated despite treatment. Remainder of lipid panel looks good.  We will continue to monitor.  Problem List Items Addressed This Visit       Cardiovascular and Mediastinum   Essential hypertension (Chronic)   Atherosclerotic plaque on cardiac vessels, seen on CT chest (Chronic)     Respiratory   COPD with hypoxia (HCC) (Chronic)     Musculoskeletal and Integument   Primary osteoarthritis of right hand     Other   Dyslipidemia (Chronic)   Encounter to establish care - Primary    Outpatient Encounter Medications as of 06/04/2021  Medication Sig   albuterol (PROVENTIL HFA;VENTOLIN HFA) 108 (90 Base) MCG/ACT inhaler Inhale 2 puffs into the lungs every 6 (six) hours as needed for wheezing or shortness of breath (cough, shortness of breath or wheezing.).   aspirin 81 MG tablet Take 81 mg by mouth every morning.    benzonatate (TESSALON) 100 MG capsule Take 1-2 capsules (100-200 mg total) by mouth 3 (three) times daily as needed.   diltiazem (CARDIZEM CD) 300 MG 24 hr capsule Take 1 capsule (300 mg total) by mouth daily.   fenofibrate (TRICOR) 145 MG tablet Take 1 tablet (145 mg total) by mouth daily.   hydrochlorothiazide (MICROZIDE) 12.5 MG capsule TAKE 1  CAPSULE BY MOUTH EVERY DAY   losartan (COZAAR) 100 MG tablet Take 1 tablet (100 mg total) by mouth daily.   Multiple Vitamins-Minerals (MULTIVITAMIN ADULT PO) Take 1 tablet by mouth every morning.    rosuvastatin (CRESTOR) 20 MG tablet Take 1 tablet (20 mg total) by mouth daily.   [DISCONTINUED] nirmatrelvir/ritonavir EUA, renal dosing, (PAXLOVID) TBPK Patient GFR is greater than 60. Take nirmatrelvir (300 mg) 2 tablets twice daily for 5 days and ritonavir (100 mg) one tablet twice daily for 5 days.   [DISCONTINUED] promethazine-dextromethorphan (PROMETHAZINE-DM) 6.25-15 MG/5ML syrup Take 5 mLs by mouth at bedtime as needed for cough.   No facility-administered encounter medications on file as of 06/04/2021.   This note was dictated using Systems analyst. Rapid proofreading was performed to expedite the delivery of the information. Despite proofreading, phonetic errors will occur which are common with this voice recognition software. Please take this into consideration. If there are any concerns, please contact our office.    Follow-up: Return in about 2 months (around 08/04/2021) for physical cbc tsh hemo a1c psa glucose.   Ronnell Freshwater, NP

## 2021-06-17 DIAGNOSIS — Z7689 Persons encountering health services in other specified circumstances: Secondary | ICD-10-CM | POA: Insufficient documentation

## 2021-06-17 DIAGNOSIS — M19041 Primary osteoarthritis, right hand: Secondary | ICD-10-CM | POA: Insufficient documentation

## 2021-08-03 ENCOUNTER — Other Ambulatory Visit: Payer: Self-pay

## 2021-08-03 DIAGNOSIS — I10 Essential (primary) hypertension: Secondary | ICD-10-CM

## 2021-08-03 DIAGNOSIS — R5383 Other fatigue: Secondary | ICD-10-CM

## 2021-08-03 DIAGNOSIS — Z0000 Encounter for general adult medical examination without abnormal findings: Secondary | ICD-10-CM

## 2021-08-04 ENCOUNTER — Other Ambulatory Visit: Payer: 59

## 2021-08-04 ENCOUNTER — Other Ambulatory Visit: Payer: Self-pay

## 2021-08-04 DIAGNOSIS — I10 Essential (primary) hypertension: Secondary | ICD-10-CM

## 2021-08-04 DIAGNOSIS — Z0000 Encounter for general adult medical examination without abnormal findings: Secondary | ICD-10-CM

## 2021-08-04 DIAGNOSIS — R5383 Other fatigue: Secondary | ICD-10-CM

## 2021-08-05 LAB — CBC WITH DIFFERENTIAL/PLATELET
Basophils Absolute: 0.1 10*3/uL (ref 0.0–0.2)
Basos: 1 %
EOS (ABSOLUTE): 0.4 10*3/uL (ref 0.0–0.4)
Eos: 5 %
Hematocrit: 45.7 % (ref 37.5–51.0)
Hemoglobin: 16 g/dL (ref 13.0–17.7)
Immature Grans (Abs): 0 10*3/uL (ref 0.0–0.1)
Immature Granulocytes: 0 %
Lymphocytes Absolute: 2.2 10*3/uL (ref 0.7–3.1)
Lymphs: 31 %
MCH: 31.9 pg (ref 26.6–33.0)
MCHC: 35 g/dL (ref 31.5–35.7)
MCV: 91 fL (ref 79–97)
Monocytes Absolute: 0.6 10*3/uL (ref 0.1–0.9)
Monocytes: 9 %
Neutrophils Absolute: 3.9 10*3/uL (ref 1.4–7.0)
Neutrophils: 54 %
Platelets: 215 10*3/uL (ref 150–450)
RBC: 5.02 x10E6/uL (ref 4.14–5.80)
RDW: 13.2 % (ref 11.6–15.4)
WBC: 7.2 10*3/uL (ref 3.4–10.8)

## 2021-08-05 LAB — HEMOGLOBIN A1C
Est. average glucose Bld gHb Est-mCnc: 126 mg/dL
Hgb A1c MFr Bld: 6 % — ABNORMAL HIGH (ref 4.8–5.6)

## 2021-08-05 LAB — TSH: TSH: 3.4 u[IU]/mL (ref 0.450–4.500)

## 2021-08-05 NOTE — Progress Notes (Signed)
Labs look good. Discuss with patient at visit 08/10/2021

## 2021-08-11 ENCOUNTER — Other Ambulatory Visit: Payer: Self-pay

## 2021-08-11 ENCOUNTER — Ambulatory Visit (INDEPENDENT_AMBULATORY_CARE_PROVIDER_SITE_OTHER): Payer: 59 | Admitting: Nurse Practitioner

## 2021-08-11 ENCOUNTER — Encounter: Payer: Self-pay | Admitting: Nurse Practitioner

## 2021-08-11 VITALS — BP 132/75 | HR 82 | Temp 98.2°F | Ht 71.0 in | Wt 204.4 lb

## 2021-08-11 DIAGNOSIS — R7301 Impaired fasting glucose: Secondary | ICD-10-CM

## 2021-08-11 DIAGNOSIS — D485 Neoplasm of uncertain behavior of skin: Secondary | ICD-10-CM | POA: Insufficient documentation

## 2021-08-11 DIAGNOSIS — Z6828 Body mass index (BMI) 28.0-28.9, adult: Secondary | ICD-10-CM | POA: Insufficient documentation

## 2021-08-11 DIAGNOSIS — I7090 Unspecified atherosclerosis: Secondary | ICD-10-CM

## 2021-08-11 DIAGNOSIS — Z0001 Encounter for general adult medical examination with abnormal findings: Secondary | ICD-10-CM

## 2021-08-11 DIAGNOSIS — I10 Essential (primary) hypertension: Secondary | ICD-10-CM | POA: Diagnosis not present

## 2021-08-11 NOTE — Patient Instructions (Signed)
Fat and Cholesterol Restricted Eating Plan Getting too much fat and cholesterol in your diet may cause health problems. Choosing the right foods helps keep your fat and cholesterol at normal levels. This can keep you from getting certain diseases. Your doctor may recommend an eating plan that includes: Total fat: ______% or less of total calories a day. This is ______g of fat a day. Saturated fat: ______% or less of total calories a day. This is ______g of saturated fat a day. Cholesterol: less than _________mg a day. Fiber: ______g a day. What are tips for following this plan? General tips Work with your doctor to lose weight if you need to. Avoid: Foods with added sugar. Fried foods. Foods with trans fat or partially hydrogenated oils. This includes some margarines and baked goods. If you drink alcohol: Limit how much you have to: 0-1 drink a day for women who are not pregnant. 0-2 drinks a day for men. Know how much alcohol is in a drink. In the [place] one drink equals one 12 oz bottle of beer (355 mL), one 5 oz glass of wine (148 mL), or one 1 oz glass of hard liquor (44 mL). Reading food labels Check food labels for: Trans fats. Partially hydrogenated oils. Saturated fat (g) in each serving. Cholesterol (mg) in each serving. Fiber (g) in each serving. Choose foods with healthy fats, such as: Monounsaturated fats and polyunsaturated fats. These include olive and canola oil, flaxseeds, walnuts, almonds, and seeds. Omega-3 fats. These are found in certain fish, flaxseed oil, and ground flaxseeds. Choose grain products that have whole grains. Look for the word "whole" as the first word in the ingredient list. Cooking Cook foods using low-fat methods. These include baking, boiling, grilling, and broiling. Eat more home-cooked foods. Eat at restaurants and buffets less often. Eat less fast food. Avoid cooking using saturated fats, such as butter, cream, palm oil, palm kernel oil, and  coconut oil. Meal planning  At meals, divide your plate into four equal parts: Fill one-half of your plate with vegetables, green salads, and fruit. Fill one-fourth of your plate with whole grains. Fill one-fourth of your plate with low-fat (lean) protein foods. Eat fish that is high in omega-3 fats at least two times a week. This includes mackerel, tuna, sardines, and salmon. Eat foods that are high in fiber, such as whole grains, beans, apples, pears, berries, broccoli, carrots, peas, and barley. What foods should I eat? Fruits All fresh, canned (in natural juice), or frozen fruits. Vegetables Fresh or frozen vegetables (raw, steamed, roasted, or grilled). Green salads. Grains Whole grains, such as whole wheat or whole grain breads, crackers, cereals, and pasta. Unsweetened oatmeal, bulgur, barley, quinoa, or brown rice. Corn or whole wheat flour tortillas. Meats and other protein foods Ground beef (85% or leaner), grass-fed beef, or beef trimmed of fat. Skinless chicken or turkey. Ground chicken or turkey. Pork trimmed of fat. All fish and seafood. Egg whites. Dried beans, peas, or lentils. Unsalted nuts or seeds. Unsalted canned beans. Nut butters without added sugar or oil. Dairy Low-fat or nonfat dairy products, such as skim or 1% milk, 2% or reduced-fat cheeses, low-fat and fat-free ricotta or cottage cheese, or plain low-fat and nonfat yogurt. Fats and oils Tub margarine without trans fats. Light or reduced-fat mayonnaise and salad dressings. Avocado. Olive, canola, sesame, or safflower oils. The items listed above may not be a complete list of foods and beverages you can eat. Contact a dietitian for more information. What foods   should I avoid? Fruits Canned fruit in heavy syrup. Fruit in cream or butter sauce. Fried fruit. Vegetables Vegetables cooked in cheese, cream, or butter sauce. Fried vegetables. Grains White bread. White pasta. White rice. Cornbread. Bagels, pastries,  and croissants. Crackers and snack foods that contain trans fat and hydrogenated oils. Meats and other protein foods Fatty cuts of meat. Ribs, chicken wings, bacon, sausage, bologna, salami, chitterlings, fatback, hot dogs, bratwurst, and packaged lunch meats. Liver and organ meats. Whole eggs and egg yolks. Chicken and turkey with skin. Fried meat. Dairy Whole or 2% milk, cream, half-and-half, and cream cheese. Whole milk cheeses. Whole-fat or sweetened yogurt. Full-fat cheeses. Nondairy creamers and whipped toppings. Processed cheese, cheese spreads, and cheese curds. Fats and oils Butter, stick margarine, lard, shortening, ghee, or bacon fat. Coconut, palm kernel, and palm oils. Beverages Alcohol. Sugar-sweetened drinks such as sodas, lemonade, and fruit drinks. Sweets and desserts Corn syrup, sugars, honey, and molasses. Candy. Jam and jelly. Syrup. Sweetened cereals. Cookies, pies, cakes, donuts, muffins, and ice cream. The items listed above may not be a complete list of foods and beverages you should avoid. Contact a dietitian for more information. Summary Choosing the right foods helps keep your fat and cholesterol at normal levels. This can keep you from getting certain diseases. At meals, fill one-half of your plate with vegetables, green salads, and fruits. Eat high fiber foods, like whole grains, beans, apples, pears, berries, carrots, peas, and barley. Limit added sugar, saturated fats, alcohol, and fried foods. This information is not intended to replace advice given to you by your health care provider. Make sure you discuss any questions you have with your health care provider. Document Revised: 01/23/2021 Document Reviewed: 01/23/2021 [employer] Patient Education  2022 [employer]  

## 2021-08-11 NOTE — Progress Notes (Signed)
Established Patient Office Visit  Subjective:  Patient ID: James Stokes, male    DOB: 1955-12-04  Age: 65 y.o. MRN: [id number]  CC:  Chief Complaint  Patient presents with   Annual Exam    HPI James Stokes presents for annual wellness visit. He does see a cardiologist routinely. They monitor his lipid panel. He is currently on crestor and tricor. Triglycerides ocntinue to be moderately elevated, though this has come down significantly in the past year. Patient states that he generally feels well. No new concerns or complaints. He denies chest pain, chest pressure, or shortness of breath. He denies headaches or visual disturbances. He denies abdominal pain, nausea, vomiting, or changes in bowel or bladder habits.   He reports torn biceps tendon of right arm.  Ventral hernia also reported . Lesion on skin of center of his back he would like to have looked at.   Past Medical History:  Diagnosis Date   Acute diastolic CHF (congestive heart failure) ([place]) 08/22/2018   Atherosclerotic plaque on cardiac vessels, seen on CT chest 08/22/2018   Dyslipidemia    ETOH abuse    HTN (hypertension)    Hypertriglyceridemia    Kidney stones    Sleep apnea in adult 08/29/2018   C-pap ordered June 2020- Dr Halford Chessman reviewed the study   Smoker     Past Surgical History:  Procedure Laterality Date   HERNIA REPAIR     pt was 33 month old   [place]     double hernia pt unsure of exact date, DR. Ballen did surgery   lipoma  05/1989   fatty tumor on back    Family History  Problem Relation Age of Onset   COPD Mother    Vascular Disease Father 38       aneurysm   Hyperlipidemia Father    Hypertension Father    Cancer Father    Diabetes Paternal Aunt    Diabetes Paternal Uncle     Social History   Socioeconomic History   Marital status: Married    Spouse name: Not on file   Number of children: Not on file   Years of education: Not on file   Highest education level: Not  on file  Occupational History   Not on file  Tobacco Use   Smoking status: Former    Packs/day: 2.00    Years: 43.00    Pack years: 86.00    Types: Cigarettes    Quit date: 08/17/2018    Years since quitting: 2.9   Smokeless tobacco: Never  Vaping Use   Vaping Use: Never used  Substance and Sexual Activity   Alcohol use: Yes    Alcohol/week: 6.0 standard drinks    Types: 6 Cans of beer per week    Comment: 1-2 beers a few days a week    Drug use: Yes    Types: Marijuana   Sexual activity: Not on file  Other Topics Concern   Not on file  Social History Narrative   Not on file   Social Determinants of Health   Financial Resource Strain: Not on file  Food Insecurity: Not on file  Transportation Needs: Not on file  Physical Activity: Not on file  Stress: Not on file  Social Connections: Not on file  Intimate Partner Violence: Not on file    Outpatient Medications Prior to Visit  Medication Sig Dispense Refill   albuterol (PROVENTIL HFA;VENTOLIN HFA) 108 (90 Base) MCG/ACT  inhaler Inhale 2 puffs into the lungs every 6 (six) hours as needed for wheezing or shortness of breath (cough, shortness of breath or wheezing.). 1 Inhaler 1   aspirin 81 MG tablet Take 81 mg by mouth every morning.      diltiazem (CARDIZEM CD) 300 MG 24 hr capsule Take 1 capsule (300 mg total) by mouth daily. 90 capsule 3   hydrochlorothiazide (MICROZIDE) 12.5 MG capsule TAKE 1 CAPSULE BY MOUTH EVERY DAY 90 capsule 3   losartan (COZAAR) 100 MG tablet Take 1 tablet (100 mg total) by mouth daily. 90 tablet 3   Multiple Vitamins-Minerals (MULTIVITAMIN ADULT PO) Take 1 tablet by mouth every morning.      nirmatrelvir & ritonavir (PAXLOVID) 20 x 150 MG & 10 x $Re'100MG'STi$  TBPK Take by mouth.     rosuvastatin (CRESTOR) 20 MG tablet Take 1 tablet (20 mg total) by mouth daily. 90 tablet 3   benzonatate (TESSALON) 100 MG capsule Take 1-2 capsules (100-200 mg total) by mouth 3 (three) times daily as needed. 60 capsule 0    fenofibrate (TRICOR) 145 MG tablet Take 1 tablet (145 mg total) by mouth daily. 90 tablet 3   No facility-administered medications prior to visit.    Allergies  Allergen Reactions   Other Hives and Itching   Shrimp [Shellfish Allergy] Itching and Rash    ROS Review of Systems  Constitutional:  Negative for activity change, chills, fatigue and fever.  HENT:  Negative for congestion, postnasal drip, rhinorrhea, sinus pressure, sinus pain, sneezing and sore throat.   Eyes: Negative.   Respiratory:  Negative for cough, shortness of breath and wheezing.        Intermittent shortness of breath. At baseline   Cardiovascular:  Negative for chest pain and palpitations.  Gastrointestinal:  Negative for constipation, diarrhea, nausea and vomiting.       Ventral hernia present.   Endocrine: Negative for cold intolerance, heat intolerance, polydipsia and polyuria.  Genitourinary:  Negative for dysuria, frequency and urgency.  Musculoskeletal:  Negative for back pain and myalgias.       Patient has torn biceps tendon from approximately 15 years ago. Has healed.   Skin:  Negative for rash.       Patient has a lesion on skin of center of back which he states his wife would like me to look at.   Allergic/Immunologic: Negative for environmental allergies.  Neurological:  Negative for dizziness, weakness and headaches.  Psychiatric/Behavioral:  The patient is not nervous/anxious.      Objective:    Physical Exam Vitals and nursing note reviewed.  Constitutional:      Appearance: Normal appearance. He is well-developed. He is obese.  HENT:     Head: Normocephalic and atraumatic.     Right Ear: Tympanic membrane, ear canal and external ear normal.     Left Ear: Tympanic membrane, ear canal and external ear normal.     Nose: Nose normal.     Mouth/Throat:     Mouth: Mucous membranes are dry.     Pharynx: Oropharynx is clear.  Eyes:     Extraocular Movements: Extraocular movements intact.      Conjunctiva/sclera: Conjunctivae normal.     Pupils: Pupils are equal, round, and reactive to light.  Neck:     Vascular: No carotid bruit.  Cardiovascular:     Rate and Rhythm: Normal rate and regular rhythm.     Pulses: Normal pulses.     Heart sounds: Normal heart  sounds.  Pulmonary:     Effort: Pulmonary effort is normal.     Breath sounds: Normal breath sounds.  Abdominal:     General: Bowel sounds are normal. There is no distension.     Palpations: Abdomen is soft.     Tenderness: There is no abdominal tenderness. There is no guarding or rebound.     Hernia: A hernia is present.  Musculoskeletal:        General: Normal range of motion.     Cervical back: Normal range of motion and neck supple.  Skin:    General: Skin is warm and dry.     Capillary Refill: Capillary refill takes less than 2 seconds.       Neurological:     General: No focal deficit present.     Mental Status: He is alert and oriented to person, place, and time.  Psychiatric:        Mood and Affect: Mood normal.        Behavior: Behavior normal.        Thought Content: Thought content normal.        Judgment: Judgment normal.   Today's Vitals   08/11/21 0933  BP: 132/75  Pulse: 82  Temp: 98.2 F (36.8 C)  SpO2: 94%  Weight: 204 lb 6.4 oz (92.7 kg)  Height: $Remove'5\' 11"'NAtddra$  (1.803 m)   Body mass index is 28.51 kg/m.   Wt Readings from Last 3 Encounters:  08/11/21 204 lb 6.4 oz (92.7 kg)  06/04/21 201 lb 14.4 oz (91.6 kg)  04/23/21 204 lb (92.5 kg)     There are no preventive care reminders to display for this patient.   There are no preventive care reminders to display for this patient.  Lab Results  Component Value Date   TSH 3.400 08/04/2021   Lab Results  Component Value Date   WBC 7.2 08/04/2021   HGB 16.0 08/04/2021   HCT 45.7 08/04/2021   MCV 91 08/04/2021   PLT 215 08/04/2021   Lab Results  Component Value Date   NA 140 05/01/2021   K 4.4 05/01/2021   CO2 21 05/01/2021    GLUCOSE 100 (H) 05/01/2021   BUN 23 05/01/2021   CREATININE 1.27 05/01/2021   BILITOT 0.3 12/02/2020   ALKPHOS 101 12/02/2020   AST 21 12/02/2020   ALT 31 12/02/2020   PROT 6.9 12/02/2020   ALBUMIN 4.3 12/02/2020   CALCIUM 8.9 05/01/2021   ANIONGAP 7 08/22/2018   EGFR 64 05/01/2021   GFR 64.95 08/28/2018   Lab Results  Component Value Date   CHOL 135 04/16/2021   Lab Results  Component Value Date   HDL 32 (L) 04/16/2021   Lab Results  Component Value Date   LDLCALC 41 04/16/2021   Lab Results  Component Value Date   TRIG 427 (H) 04/16/2021   Lab Results  Component Value Date   CHOLHDL 4.2 04/16/2021   Lab Results  Component Value Date   HGBA1C 6.0 (H) 08/04/2021      Assessment & Plan:  1. Encounter for general adult medical examination with abnormal findings Annual wellness visit today   2. Impaired fasting glucose Reviewed labs. HgbA1c 6.0. encouraged him to limit intake of carbohydrates and sugar and to incorporate exercise into daily routine.   3. Essential hypertension Stable. Continue bp medication as prescribed   4. Atherosclerotic plaque on cardiac vessels, seen on CT chest Continues to see cardiology as scheduled   5. Body mass  index 28.0-28.9, adult Encourage patient to limit calorie intake to 2000 cal/day or less.  He should consume a low cholesterol, low-fat diet.  Patient incorporate exercise into his daily routine.   6. 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 He states he does not need referral .   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    Ronnell Freshwater, NP

## 2021-08-17 ENCOUNTER — Encounter: Payer: Self-pay | Admitting: Internal Medicine

## 2021-08-17 ENCOUNTER — Other Ambulatory Visit: Payer: Self-pay

## 2021-08-17 ENCOUNTER — Ambulatory Visit (INDEPENDENT_AMBULATORY_CARE_PROVIDER_SITE_OTHER): Payer: 59 | Admitting: Internal Medicine

## 2021-08-17 VITALS — BP 164/82 | HR 95 | Resp 20 | Ht 71.0 in | Wt 204.8 lb

## 2021-08-17 DIAGNOSIS — I7090 Unspecified atherosclerosis: Secondary | ICD-10-CM | POA: Diagnosis not present

## 2021-08-17 DIAGNOSIS — E782 Mixed hyperlipidemia: Secondary | ICD-10-CM | POA: Diagnosis not present

## 2021-08-17 DIAGNOSIS — E785 Hyperlipidemia, unspecified: Secondary | ICD-10-CM

## 2021-08-17 NOTE — Patient Instructions (Addendum)
Medication Instructions:  NO CHANGES today   *If you need a refill on your cardiac medications before your next appointment, please call your pharmacy*   Lab Work: FASTING lab work to check cholesterol -- lipid panel, direct LDL  If you have labs (blood work) drawn today and your tests are completely normal, you will receive your results only by: [place] (if you have MyChart) OR A paper copy in the mail If you have any lab test that is abnormal or we need to change your treatment, we will call you to review the results.   Testing/Procedures: NONE   Follow-Up: At [facility], you and your health needs are our priority.  As part of our continuing mission to provide you with exceptional heart care, we have created designated Provider Care Teams.  These Care Teams include your primary Cardiologist (physician) and Advanced Practice Providers (APPs -  Physician Assistants and Nurse Practitioners) who all work together to provide you with the care you need, when you need it.  We recommend signing up for the patient portal called "MyChart".  Sign up information is provided on this After Visit Summary.  MyChart is used to connect with patients for Virtual Visits (Telemedicine).  Patients are able to view lab/test results, encounter notes, upcoming appointments, etc.  Non-urgent messages can be sent to your provider as well.   To learn more about what you can do with MyChart, go to [email or web address].    Your next appointment:   6 month(s) -- fasting labs prior  The format for your next appointment:   In Person  Provider:   Dr. Lyman Bishop

## 2021-08-17 NOTE — Progress Notes (Signed)
LIPID CLINIC CONSULT NOTE  Chief Complaint:  Follow-up dyslipidemia  Primary Care Physician: Ronnell Freshwater, NP  Primary Cardiologist:  [name], MD  HPI:  James Stokes is a 65 y.o. male who is being seen today for the evaluation of dyslipidemia at the request of Levin Bacon, NP. James Stokes is a 65 y.o. male who presents via audio/video conferencing for a telehealth visit today. Mr. James Stokes is a pleasant 65 year old male kindly referred by Dr. Alvester Chou for evaluation and management of dyslipidemia. There is evidence for longstanding hypertriglyceridemia however he has not previously been on any therapies including a statin, fibrate or other lipid-lowering therapy. He was hospitalized in 1914 for COPD/diastolic heart failure exacerbation. He also has a history of some alcohol use which she is cut back significantly. He also stopped smoking 2 years ago. He has had better control of his blood pressure and is compliant with CPAP. 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 He has no known coronary disease and had a negative Myoview last in 2005 but was shown to have coronary calcification by CT in 2019. Recently his lipid profile showed a sharp rise in triglycerides with total cholesterol 353, triglycerides 1768, HDL 21 and LDL could not be calculated. In fact he has had multiple elevated triglycerides on blood work including 2 years ago at 22, 695 the year before that and 853 the year before that. The only direct LDL that I could find was in 2018 which was 76, suggesting this may be a primary hypertriglyceridemia. He reports a varied diet but does have reasonable amount of saturated fat intake and sweets.  12/09/2020  Mr. Chichester returns today for follow-up.  Unfortunately he had switched insurances.  This led to him not being able to get his Crestor.  Finally he was able to get it and just started taking it  this past weekend.  This was after his recent lab work was drawn.  That lab work showed total cholesterol 257, HDL 31, LDL 93 and triglycerides 792.  This is a significant improvement from triglycerides at 1768 in the past.  He may be more active or has made some dietary changes since then.  04/23/2021  Mr. Bielinski is seen today for follow-up.  He continues to have marked improvement in his lipids.  He is tolerating the rosuvastatin without issues.  Cholesterol has come down significantly with total now 135, HDL 32, LDL 41 and triglycerides 427.  Previously triglycerides were 792 and prior to that 1768.  He has had no episodes of pancreatitis.  He says he is continuing to work on his diet.  08/17/2021  Mr. Strawderman returns today for follow-up.  He had recent labs at his primary care doctor's office unfortunately however they did not draw the lipid profile which we had ordered.  Triglycerides had continued to come down significantly, last were 4 and 27.  LDL cholesterol is down to 41.  This is all on rosuvastatin 20 mg daily.  He is also made dietary changes.  We discussed the possibility of adding a fibrate or omega-3 if his triglycerides remain elevated however they need to be reassessed now.  PMHx:  Past Medical History:  Diagnosis Date   Acute diastolic CHF (congestive heart failure) ([place]) 08/22/2018   Atherosclerotic plaque on cardiac vessels, seen on CT chest 08/22/2018   Dyslipidemia    ETOH abuse  HTN (hypertension)    Hypertriglyceridemia    Kidney stones    Sleep apnea in adult 08/29/2018   C-pap ordered June 2020- Dr Halford Chessman reviewed the study   Smoker     Past Surgical History:  Procedure Laterality Date   HERNIA REPAIR     pt was 58 month old   [place]     double hernia pt unsure of exact date, DR. Ballen did surgery   lipoma  05/1989   fatty tumor on back    FAMHx:  Family History  Problem Relation Age of Onset   COPD Mother    Vascular Disease Father 43        aneurysm   Hyperlipidemia Father    Hypertension Father    Cancer Father    Diabetes Paternal Aunt    Diabetes Paternal Uncle     SOCHx:   reports that he quit smoking about 3 years ago. His smoking use included cigarettes. He has a 86.00 pack-year smoking history. He has never used smokeless tobacco. He reports current alcohol use of about 6.0 standard drinks per week. He reports current drug use. Drug: Marijuana.  ALLERGIES:  Allergies  Allergen Reactions   Other Hives and Itching   Shrimp [Shellfish Allergy] Itching and Rash    ROS: Pertinent items noted in HPI and remainder of comprehensive ROS otherwise negative.  HOME MEDS: Current Outpatient Medications on File Prior to Visit  Medication Sig Dispense Refill   albuterol (PROVENTIL HFA;VENTOLIN HFA) 108 (90 Base) MCG/ACT inhaler Inhale 2 puffs into the lungs every 6 (six) hours as needed for wheezing or shortness of breath (cough, shortness of breath or wheezing.). 1 Inhaler 1   aspirin 81 MG tablet Take 81 mg by mouth every morning.      diltiazem (CARDIZEM CD) 300 MG 24 hr capsule Take 1 capsule (300 mg total) by mouth daily. 90 capsule 3   hydrochlorothiazide (MICROZIDE) 12.5 MG capsule TAKE 1 CAPSULE BY MOUTH EVERY DAY 90 capsule 3   losartan (COZAAR) 100 MG tablet Take 1 tablet (100 mg total) by mouth daily. 90 tablet 3   Multiple Vitamins-Minerals (MULTIVITAMIN ADULT PO) Take 1 tablet by mouth every morning.      rosuvastatin (CRESTOR) 20 MG tablet Take 1 tablet (20 mg total) by mouth daily. 90 tablet 3   No current facility-administered medications on file prior to visit.    LABS/IMAGING: No results found for this or any previous visit (from the past 48 hour(s)). No results found.  LIPID PANEL:    Component Value Date/Time   CHOL 135 04/16/2021 0935   TRIG 427 (H) 04/16/2021 0935   HDL 32 (L) 04/16/2021 0935   CHOLHDL 4.2 04/16/2021 0935   CHOLHDL 6.8 (H) 01/25/2017 0901   VLDL NOT CALC 01/25/2017 0901    LDLCALC 41 04/16/2021 0935   LDLDIRECT 75 01/25/2017 0901    WEIGHTS: Wt Readings from Last 3 Encounters:  08/17/21 204 lb 12.8 oz (92.9 kg)  08/11/21 204 lb 6.4 oz (92.7 kg)  06/04/21 201 lb 14.4 oz (91.6 kg)    VITALS: BP (!) 164/82 (BP Location: Left Arm, Patient Position: Sitting, Cuff Size: Normal)   Pulse 95   Resp 20   Ht 5\' 11"  (1.803 m)   Wt 204 lb 12.8 oz (92.9 kg)   SpO2 94%   BMI 28.56 kg/m   EXAM: Deferred  EKG: Deferred  ASSESSMENT: Mixed dyslipidemia with primarily high triglycerides and low HDL Coronary artery calcification COPD, severe  History of diastolic heart failure  PLAN: 1.   Mr. Gedney unfortunately did not have his lipids tested with the rest of his labs by his PCP.  We will order that again and advised that he get them fasting which she is not today.  I will follow-up with him and if additional therapy is necessary will recommend it.  Otherwise she has had a very good response to rosuvastatin.  Pixie Casino, MD, [facility], [place] Director of the Advanced Lipid Disorders &  Cardiovascular Risk Reduction Clinic Diplomate of the American Board of Clinical Lipidology Attending Cardiologist  Direct Dial: [phone]  Fax: [phone]  Website:  [email or web address] 08/17/2021, 8:50 AM

## 2021-08-28 LAB — LIPID PANEL
Chol/HDL Ratio: 5 ratio (ref 0.0–5.0)
Cholesterol, Total: 155 mg/dL (ref 100–199)
HDL: 31 mg/dL — ABNORMAL LOW (ref >39)
LDL Chol Calc (NIH): 53 mg/dL (ref 0–99)
Triglycerides: 477 mg/dL — ABNORMAL HIGH (ref 0–149)
VLDL Cholesterol Cal: 71 mg/dL — ABNORMAL HIGH (ref 5–40)

## 2021-08-28 LAB — LDL CHOLESTEROL, DIRECT: LDL Direct: 41 mg/dL (ref 0–99)

## 2021-11-01 ENCOUNTER — Other Ambulatory Visit: Payer: Self-pay | Admitting: Cardiovascular Disease

## 2021-12-04 ENCOUNTER — Encounter: Payer: Self-pay | Admitting: Nurse Practitioner

## 2021-12-04 ENCOUNTER — Other Ambulatory Visit: Payer: Self-pay

## 2021-12-04 ENCOUNTER — Ambulatory Visit (INDEPENDENT_AMBULATORY_CARE_PROVIDER_SITE_OTHER): Payer: Managed Care, Other (non HMO) | Admitting: Nurse Practitioner

## 2021-12-04 VITALS — BP 123/68 | HR 84 | Temp 98.7°F | Ht 71.0 in | Wt 207.1 lb

## 2021-12-04 DIAGNOSIS — L03115 Cellulitis of right lower limb: Secondary | ICD-10-CM

## 2021-12-04 DIAGNOSIS — Z6828 Body mass index (BMI) 28.0-28.9, adult: Secondary | ICD-10-CM | POA: Diagnosis not present

## 2021-12-04 MED ORDER — DOXYCYCLINE HYCLATE 100 MG PO TABS: 100.0000 mg | ORAL_TABLET | Freq: Two times a day (BID) | ORAL | 0 refills | Status: DC

## 2021-12-04 NOTE — Progress Notes (Unsigned)
Established patient visit   Patient: James Stokes   DOB: [birth year]   66 y.o. Male  MRN: [id number] Visit Date: 12/04/2021  Chief Complaint  Patient presents with   Cough   Leg Swelling   Subjective    HPI  The patient states that he has felt poorly the past few days. Has nasal congestion, cough, slight fever, and headache. The patient states that he had increased shortness of breath yesterday. States that using his oxygen made this a bit better. Has taken alever. He states that this has not helped.  Right leg swelling and tenderness. Hurts. Feels like all of the bones are "out of place." Pain started two days ago, but swelling started abouta week ago.   Medications: Outpatient Medications Prior to Visit  Medication Sig   albuterol (PROVENTIL HFA;VENTOLIN HFA) 108 (90 Base) MCG/ACT inhaler Inhale 2 puffs into the lungs every 6 (six) hours as needed for wheezing or shortness of breath (cough, shortness of breath or wheezing.).   aspirin 81 MG tablet Take 81 mg by mouth every morning.    diltiazem (CARDIZEM CD) 300 MG 24 hr capsule TAKE 1 CAPSULE BY MOUTH ONCE A DAY   hydrochlorothiazide (MICROZIDE) 12.5 MG capsule TAKE 1 CAPSULE BY MOUTH ONCE A DAY   losartan (COZAAR) 100 MG tablet TAKE 1 TABLET (100 MG TOTAL) BY MOUTH DAILY.   Multiple Vitamins-Minerals (MULTIVITAMIN ADULT PO) Take 1 tablet by mouth every morning.    rosuvastatin (CRESTOR) 20 MG tablet Take 1 tablet (20 mg total) by mouth daily.   No facility-administered medications prior to visit.    Review of Systems   Objective     Today's Vitals   12/04/21 1046 12/04/21 1055  BP: (!) 147/73 123/68  Pulse: 84   Temp: 98.7 F (37.1 C)   SpO2: 94%   Weight: 207 lb 1.6 oz (93.9 kg)   Height: '5\' 11"'$  (1.803 m)    Body mass index is 28.88 kg/m.   Physical Exam  ***  No results found for any visits on 12/04/21.  Assessment & Plan     ***  No follow-ups on file.        Ronnell Freshwater, NP  [facility]  Primary Care at [facility] [phone] (phone) [phone] (fax)  [place]

## 2021-12-07 ENCOUNTER — Ambulatory Visit: Payer: Self-pay | Admitting: Nurse Practitioner

## 2021-12-14 ENCOUNTER — Telehealth: Payer: Self-pay | Admitting: Cardiovascular Disease

## 2021-12-14 DIAGNOSIS — L03115 Cellulitis of right lower limb: Secondary | ICD-10-CM | POA: Insufficient documentation

## 2021-12-14 NOTE — Telephone Encounter (Signed)
?*  STAT* If patient is at the pharmacy, call can be transferred to refill team. ? ? ?1. Which medications need to be refilled? (please list name of each medication and dose if known) losartan (COZAAR) 100 MG tablet ? ?hydrochlorothiazide (MICROZIDE) 12.5 MG capsule ? ?losartan (COZAAR) 100 MG tablet ? ?hydrochlorothiazide (MICROZIDE) 12.5 MG capsule ? ?2. Which pharmacy/location (including street and city if local pharmacy) is medication to be sent to? [facility] [place] [address] ? ?3. Do they need a 30 day or 90 day supply? 30 ? ?Patient states he change insurance and its only allow to do 30days ? ?

## 2021-12-16 MED ORDER — HYDROCHLOROTHIAZIDE 12.5 MG PO CAPS: ORAL_CAPSULE | ORAL | 11 refills | Status: DC

## 2021-12-16 MED ORDER — LOSARTAN POTASSIUM 100 MG PO TABS: 100.0000 mg | ORAL_TABLET | Freq: Every day | ORAL | 11 refills | Status: DC

## 2021-12-16 NOTE — Telephone Encounter (Signed)
Refills sent to [facility]. ?

## 2021-12-16 NOTE — Telephone Encounter (Signed)
Pt calling to F/U, he is out of medication ?

## 2021-12-21 ENCOUNTER — Telehealth: Payer: Self-pay | Admitting: Cardiovascular Disease

## 2021-12-21 ENCOUNTER — Other Ambulatory Visit: Payer: Self-pay | Admitting: *Deleted

## 2021-12-21 DIAGNOSIS — E785 Hyperlipidemia, unspecified: Secondary | ICD-10-CM

## 2021-12-21 MED ORDER — DILTIAZEM HCL ER COATED BEADS 300 MG PO CP24: 300.0000 mg | ORAL_CAPSULE | Freq: Every day | ORAL | 1 refills | Status: DC

## 2021-12-21 MED ORDER — ROSUVASTATIN CALCIUM 20 MG PO TABS: 20.0000 mg | ORAL_TABLET | Freq: Every day | ORAL | 1 refills | Status: DC

## 2021-12-21 NOTE — Telephone Encounter (Signed)
?*  STAT* If patient is at the pharmacy, call can be transferred to refill team. ? ? ?1. Which medications need to be refilled? (please list name of each medication and dose if known)  ?diltiazem (CARDIZEM CD) 300 MG 24 hr capsule ?rosuvastatin (CRESTOR) 20 MG tablet ? ? ?2. Which pharmacy/location (including street and city if local pharmacy) is medication to be sent to? [facility] [place] [address] ? ?3. Do they need a 30 day or 90 day supply? 30 day ? ? ?Patient states he has been out since last Monday.  ?

## 2022-02-07 NOTE — Progress Notes (Signed)
Established patient visit ? ? ?Patient: James Stokes   DOB: July 20, 1956   66 y.o. Male  MRN: [id number] ?Visit Date: 02/08/2022 ? ? ?Chief Complaint  ?Patient presents with  ? Follow-up  ? Hemoglobin A1c Screening  ? ?Subjective  ?  ?HPI  ?Six month follow up visit.  ?-patient prediabetic with most recent HgbA1c 6.9 ?-due to have check HgbA1c today.  ?-sees cardiology for CAD with elevated lipidi panel.  ?-denies chest pain, chest pressure, or shortness of breath. He denies headaches or visual disturbances. He denies abdominal pain, nausea, vomiting, or changes in bowel or bladder habits.   ?-has had mild superficial allergic reactions to bee stings. Would like to get epi pen to have just in case this allergy develops into more severe allergy. 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  ? ? ?Medications: ?Outpatient Medications Prior to Visit  ?Medication Sig  ? albuterol (PROVENTIL HFA;VENTOLIN HFA) 108 (90 Base) MCG/ACT inhaler Inhale 2 puffs into the lungs every 6 (six) hours as needed for wheezing or shortness of breath (cough, shortness of breath or wheezing.).  ? aspirin 81 MG tablet Take 81 mg by mouth every morning.   ? diltiazem (CARDIZEM CD) 300 MG 24 hr capsule Take 1 capsule (300 mg total) by mouth daily.  ? hydrochlorothiazide (MICROZIDE) 12.5 MG capsule TAKE 1 CAPSULE BY MOUTH ONCE A DAY  ? losartan (COZAAR) 100 MG tablet Take 1 tablet (100 mg total) by mouth daily.  ? Multiple Vitamins-Minerals (MULTIVITAMIN ADULT PO) Take 1 tablet by mouth every morning.   ? rosuvastatin (CRESTOR) 20 MG tablet Take 1 tablet (20 mg total) by mouth daily.  ? [DISCONTINUED] doxycycline (VIBRA-TABS) 100 MG tablet Take 1 tablet (100 mg total) by mouth 2 (two) times daily.  ? ?No facility-administered medications prior to visit.  ? ? ?Review of Systems  ?Constitutional:  Negative for activity change, chills, fatigue and fever.  ?HENT:  Negative for congestion, postnasal drip, rhinorrhea, sinus pressure,  sinus pain, sneezing and sore throat.   ?Eyes: Negative.   ?Respiratory:  Negative for cough, shortness of breath and wheezing.   ?Cardiovascular:  Negative for chest pain and palpitations.  ?Gastrointestinal:  Negative for constipation, diarrhea, nausea and vomiting.  ?Endocrine: Negative for cold intolerance, heat intolerance, polydipsia and polyuria.  ?     Blood sugars doing well    ?Genitourinary:  Negative for dysuria, frequency and urgency.  ?Musculoskeletal:  Negative for back pain and myalgias.  ?Skin:  Negative for rash.  ?Allergic/Immunologic: Negative for environmental allergies.  ?Neurological:  Negative for dizziness, weakness and headaches.  ?Psychiatric/Behavioral:  The patient is not nervous/anxious.   ? ? ? ? Objective  ?  ? ?Today's Vitals  ? 02/08/22 0828 02/08/22 0854  ?BP: (!) 148/72 124/68  ?Pulse: 98   ?Temp: 97.7 ?F (36.5 ?C)   ?SpO2: 93%   ?Weight: 207 lb (93.9 kg)   ?Height: 5' 10.87" (1.8 m)   ? ?Body mass index is 28.98 kg/m?.  ? ?BP Readings from Last 3 Encounters:  ?02/08/22 124/68  ?12/04/21 123/68  ?08/17/21 (!) 164/82  ?  ?Wt Readings from Last 3 Encounters:  ?02/08/22 207 lb (93.9 kg)  ?12/04/21 207 lb 1.6 oz (93.9 kg)  ?08/17/21 204 lb 12.8 oz (92.9 kg)  ?  ?Physical Exam ?Vitals and nursing note reviewed.  ?Constitutional:   ?   Appearance: Normal appearance. He is well-developed.  ?HENT:  ?   Head: Normocephalic and atraumatic.  ?Eyes:  ?  Pupils: Pupils are equal, round, and reactive to light.  ?Cardiovascular:  ?   Rate and Rhythm: Normal rate and regular rhythm.  ?   Pulses: Normal pulses.  ?   Heart sounds: Normal heart sounds.  ?Pulmonary:  ?   Effort: Pulmonary effort is normal.  ?   Breath sounds: Normal breath sounds.  ?Abdominal:  ?   Palpations: Abdomen is soft.  ?Musculoskeletal:     ?   General: Normal range of motion.  ?   Cervical back: Normal range of motion and neck supple.  ?Lymphadenopathy:  ?   Cervical: No cervical adenopathy.  ?Skin: ?   General: Skin is  warm and dry.  ?   Capillary Refill: Capillary refill takes less than 2 seconds.  ?Neurological:  ?   General: No focal deficit present.  ?   Mental Status: He is alert and oriented to person, place, and time.  ?Psychiatric:     ?   Mood and Affect: Mood normal.     ?   Behavior: Behavior normal.     ?   Thought Content: Thought content normal.     ?   Judgment: Judgment normal.  ?  ? ? Assessment & Plan  ?  ?1. Impaired fasting glucose ?Check HgbA1c today. Will notify patient of results when available. Treat as indicated.  ?- Hemoglobin A1c; Future ?- Hemoglobin A1c ? ?2. Essential hypertension ?Stable. Continue bp medication and regular visits with cardiology as scheduled.  ? 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 Instructed patient to use only if he feels lips and tongue swelling or if it becomes difficult to breathe. Advised him to contact EMS if needing to use thie epi pen. He voiced understnading and agreement.  ?- EPINEPHrine (EPIPEN 2-PAK) 0.3 mg/0.3 mL IJ SOAJ injection; Inject 0.3 mg into the muscle as needed for anaphylaxis.  Dispense: 1 each; Refill: 1 ? ?4. Body mass index 28.0-28.9, adult ?Encourage patient to limit calorie intake to 2000 cal/day or less.  He should consume a low cholesterol, low-fat diet.   ? ?5. 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  ? ?  ?Problem List Items Addressed This Visit   ? ?  ? Cardiovascular and Mediastinum  ? Essential hypertension (Chronic)  ? Relevant Medications  ? EPINEPHrine (EPIPEN 2-PAK) 0.3 mg/0.3 mL IJ SOAJ injection  ? 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 Relevant Medications  ? EPINEPHrine (EPIPEN 2-PAK) 0.3 mg/0.3 mL IJ SOAJ injection  ?  ? Endocrine  ? Impaired fasting glucose - Primary  ? Relevant Orders  ? Hemoglobin A1c  ?  ? Other  ? Body mass index 28.0-28.9, adult  ? Allergy to bee sting  ? Relevant Medications  ?  EPINEPHrine (EPIPEN 2-PAK) 0.3 mg/0.3 mL IJ SOAJ injection  ?  ? ?Return in about 3 months (around 05/11/2022) for diabetes with HgbA1c check.  ?   ? ? ? ? ?Ronnell Freshwater, NP  ?[facility] ?[phone] (phone) ?[phone] (fax) ? ?[facility]  ?

## 2022-02-08 ENCOUNTER — Encounter: Payer: Self-pay | Admitting: Nurse Practitioner

## 2022-02-08 ENCOUNTER — Ambulatory Visit (INDEPENDENT_AMBULATORY_CARE_PROVIDER_SITE_OTHER): Payer: Commercial Managed Care - HMO | Admitting: Nurse Practitioner

## 2022-02-08 VITALS — BP 124/68 | HR 98 | Temp 97.7°F | Ht 70.87 in | Wt 207.0 lb

## 2022-02-08 DIAGNOSIS — R7301 Impaired fasting glucose: Secondary | ICD-10-CM | POA: Diagnosis not present

## 2022-02-08 DIAGNOSIS — Z6828 Body mass index (BMI) 28.0-28.9, adult: Secondary | ICD-10-CM | POA: Diagnosis not present

## 2022-02-08 DIAGNOSIS — I10 Essential (primary) hypertension: Secondary | ICD-10-CM | POA: Diagnosis not present

## 2022-02-08 DIAGNOSIS — Z91030 Bee allergy status: Secondary | ICD-10-CM | POA: Diagnosis not present

## 2022-02-08 DIAGNOSIS — I7090 Unspecified atherosclerosis: Secondary | ICD-10-CM

## 2022-02-08 MED ORDER — EPINEPHRINE 0.3 MG/0.3ML IJ SOAJ: 0.3000 mg | INTRAMUSCULAR | 1 refills | Status: DC | PRN

## 2022-02-09 LAB — HEMOGLOBIN A1C
Est. average glucose Bld gHb Est-mCnc: 134 mg/dL
Hgb A1c MFr Bld: 6.3 % — ABNORMAL HIGH (ref 4.8–5.6)

## 2022-02-10 ENCOUNTER — Telehealth: Payer: Self-pay | Admitting: Nurse Practitioner

## 2022-02-10 ENCOUNTER — Other Ambulatory Visit: Payer: Self-pay

## 2022-02-10 DIAGNOSIS — Z91030 Bee allergy status: Secondary | ICD-10-CM

## 2022-02-10 MED ORDER — EPINEPHRINE 0.3 MG/0.3ML IJ SOAJ: 0.3000 mg | INTRAMUSCULAR | 1 refills | Status: AC | PRN

## 2022-02-10 NOTE — Telephone Encounter (Signed)
Rx was sent to [facility] on [address] ?

## 2022-02-10 NOTE — Telephone Encounter (Signed)
Patient requesting a refill of his Epipen and would like it sent to [place]. Please advise.  ?

## 2022-04-01 IMAGING — DX DG CHEST 2V
2 series · 2 of 2 positions shown · non-contrast
Comparison: 08/17/2018

CLINICAL DATA: Shortness of breath

EXAM:
CHEST - 2 VIEW

[chest pa]
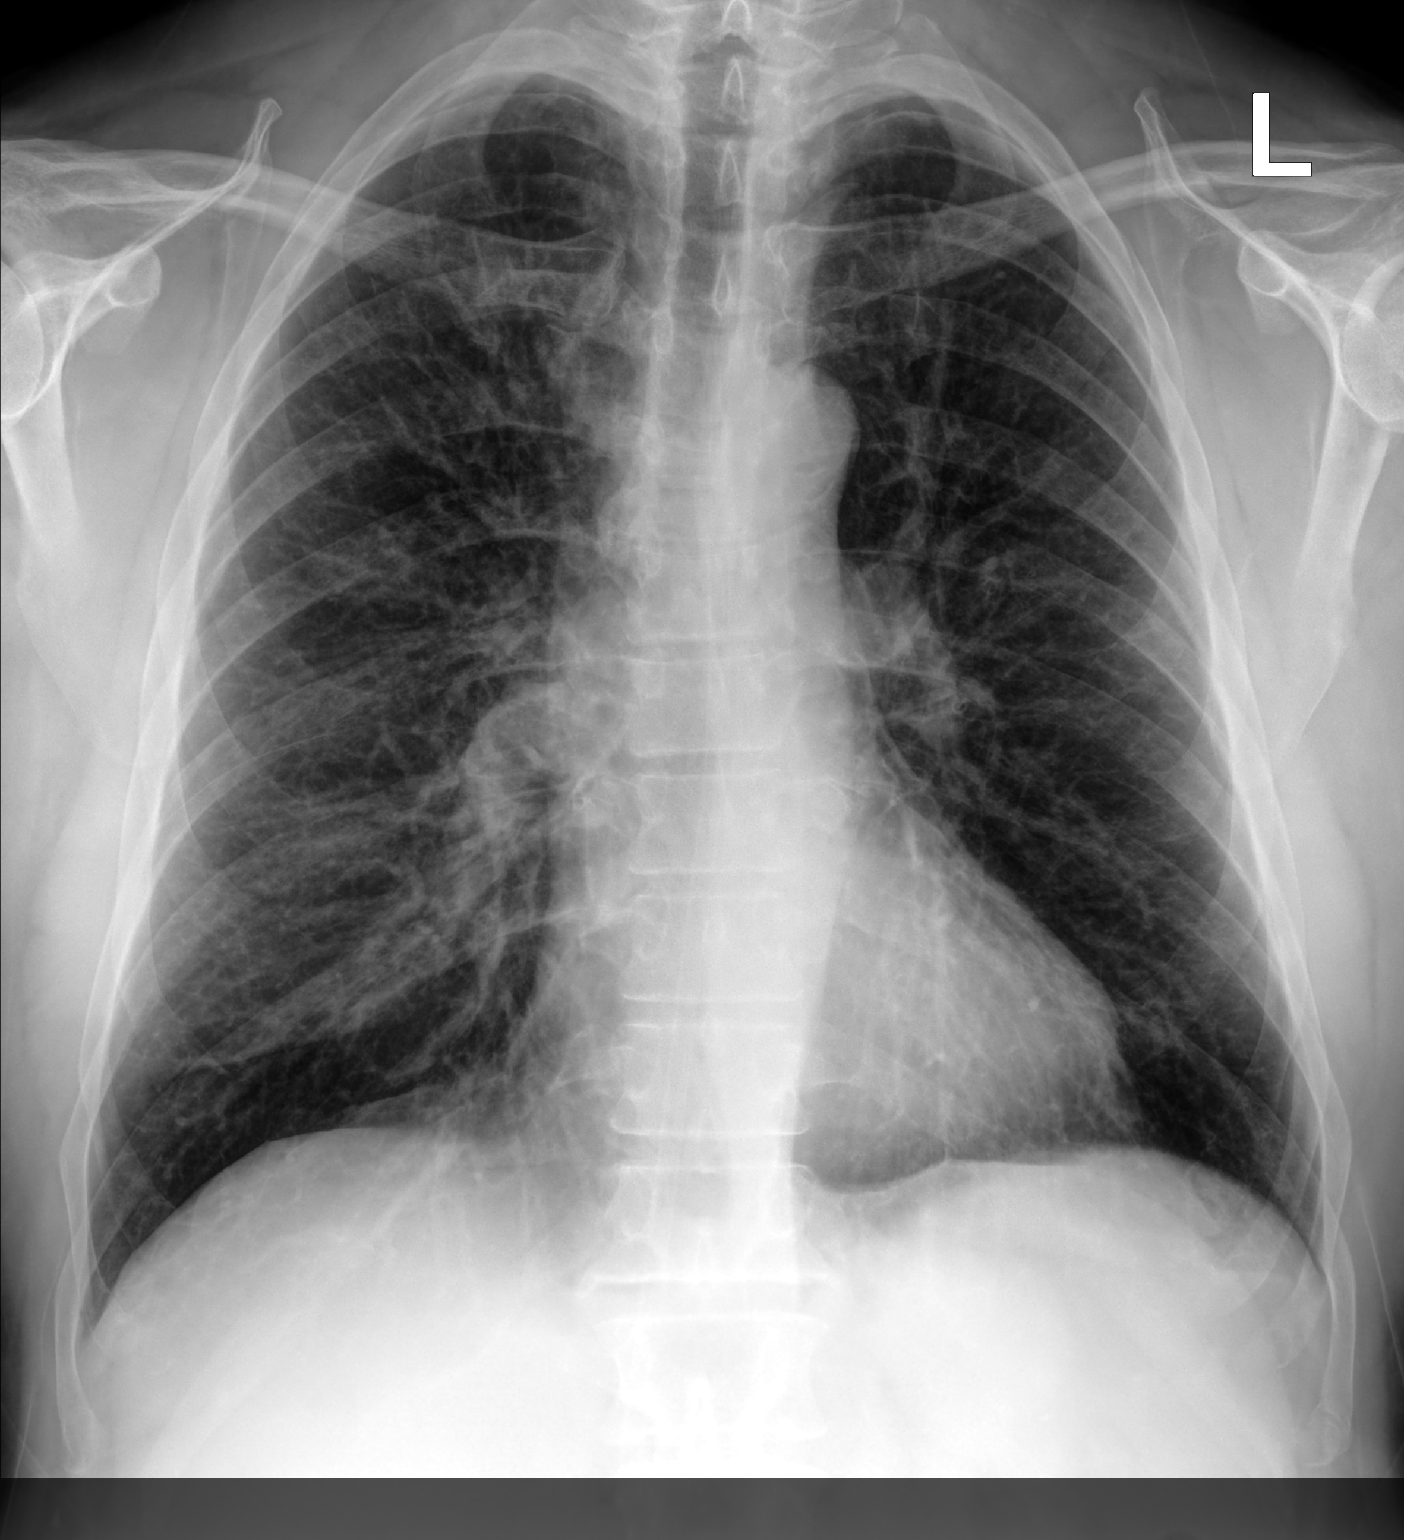

[chest lat]
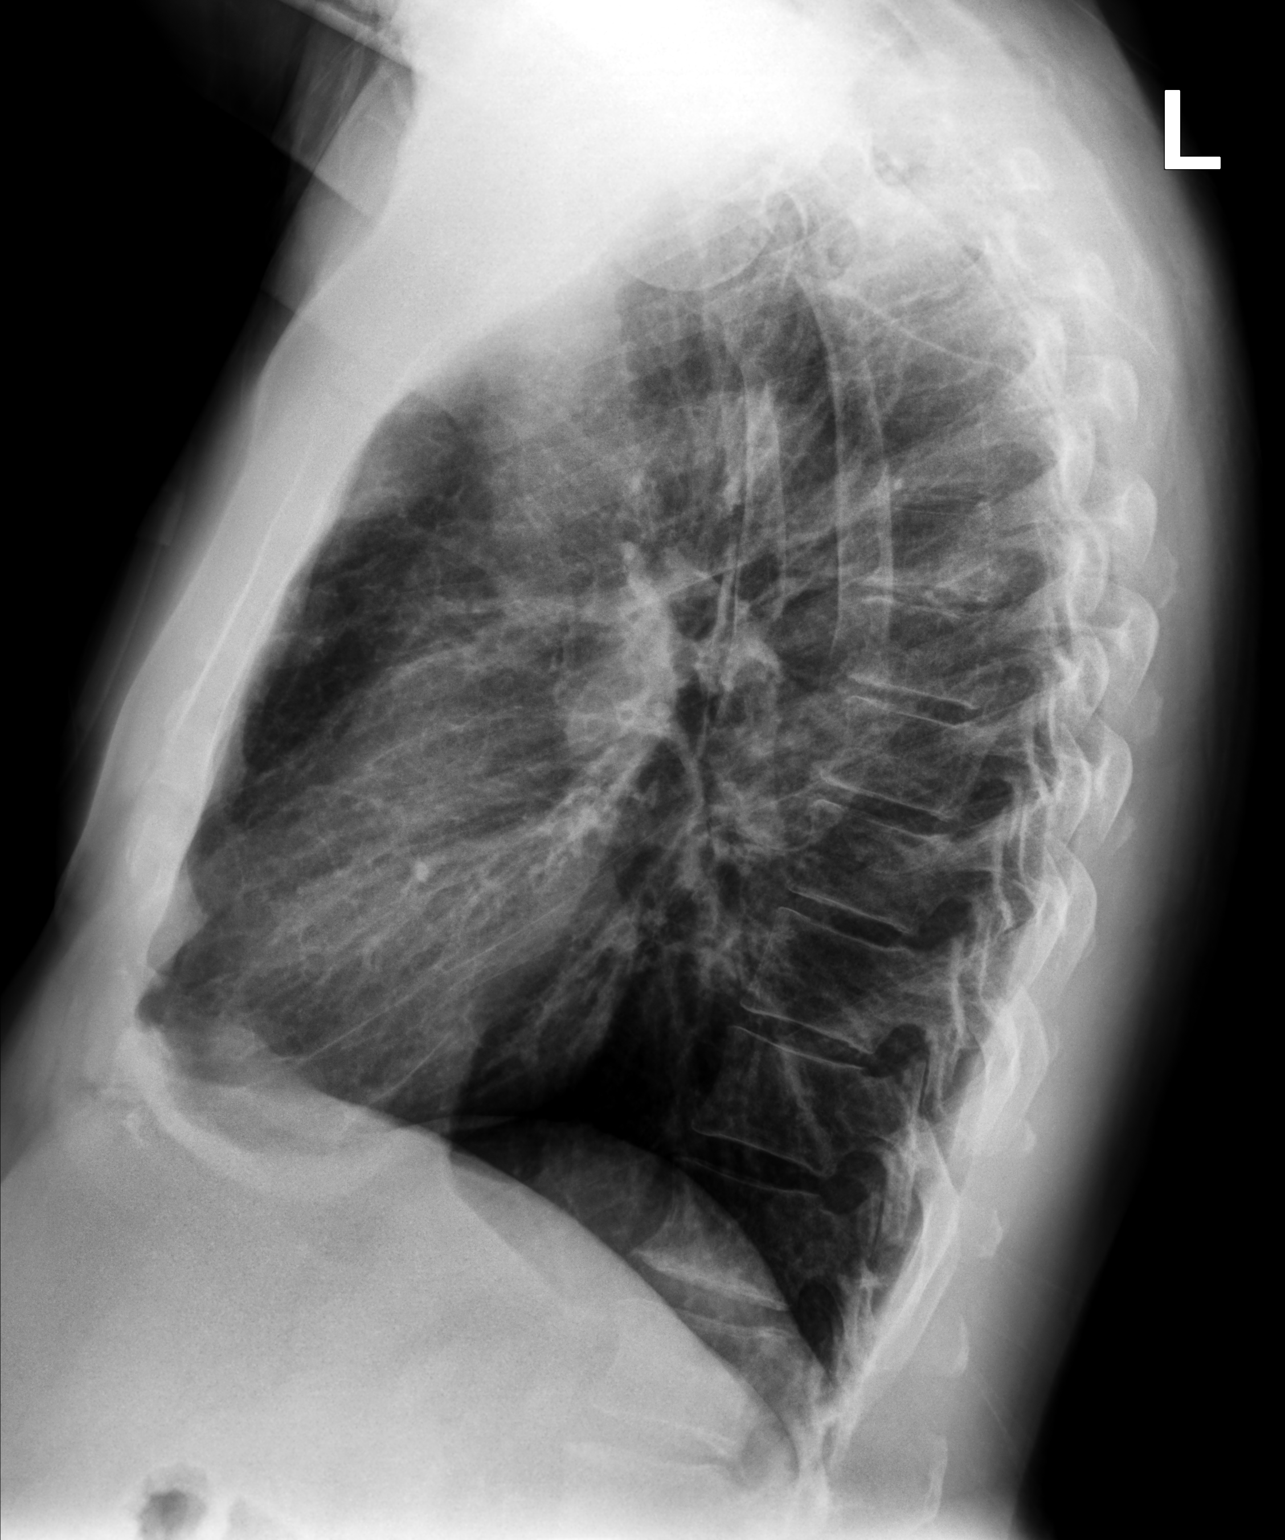

[2 of 2 positions shown; findings below may reference images not displayed]

FINDINGS: Cardiomediastinal contours are within normal limits. Mild
hyperinflation with mildly prominent interstitial markings
bilaterally, similar to prior. No focal airspace consolidation,
pleural effusion, or pneumothorax.
IMPRESSION: 1. No active cardiopulmonary disease.
2. COPD.

## 2022-05-10 NOTE — Progress Notes (Unsigned)
Established patient visit   Patient: James Stokes   DOB: 09-25-1956   66 y.o. Male  MRN: [id number] Visit Date: 05/11/2022   No chief complaint on file.  Subjective    HPI  Follow up  -due to have check of HgbA1c today  -most recent check was 02/08/2022 and was 6.5  -sees cardiology due to CAD    Medications: Outpatient Medications Prior to Visit  Medication Sig   albuterol (PROVENTIL HFA;VENTOLIN HFA) 108 (90 Base) MCG/ACT inhaler Inhale 2 puffs into the lungs every 6 (six) hours as needed for wheezing or shortness of breath (cough, shortness of breath or wheezing.).   aspirin 81 MG tablet Take 81 mg by mouth every morning.    diltiazem (CARDIZEM CD) 300 MG 24 hr capsule Take 1 capsule (300 mg total) by mouth daily.   EPINEPHrine (EPIPEN 2-PAK) 0.3 mg/0.3 mL IJ SOAJ injection Inject 0.3 mg into the muscle as needed for anaphylaxis.   hydrochlorothiazide (MICROZIDE) 12.5 MG capsule TAKE 1 CAPSULE BY MOUTH ONCE A DAY   losartan (COZAAR) 100 MG tablet Take 1 tablet (100 mg total) by mouth daily.   Multiple Vitamins-Minerals (MULTIVITAMIN ADULT PO) Take 1 tablet by mouth every morning.    rosuvastatin (CRESTOR) 20 MG tablet Take 1 tablet (20 mg total) by mouth daily.   No facility-administered medications prior to visit.    Review of Systems  {Labs (Optional):23779}   Objective    There were no vitals taken for this visit. BP Readings from Last 3 Encounters:  02/08/22 124/68  12/04/21 123/68  08/17/21 (!) 164/82    Wt Readings from Last 3 Encounters:  02/08/22 207 lb (93.9 kg)  12/04/21 207 lb 1.6 oz (93.9 kg)  08/17/21 204 lb 12.8 oz (92.9 kg)    Physical Exam  ***  No results found for any visits on 05/11/22.  Assessment & Plan     Problem List Items Addressed This Visit   None    No follow-ups on file.         Ronnell Freshwater, NP  [facility] at [facility] [address] [phone] (phone) [phone] (fax)  [place]

## 2022-05-11 ENCOUNTER — Encounter: Payer: Self-pay | Admitting: Nurse Practitioner

## 2022-05-11 ENCOUNTER — Ambulatory Visit (INDEPENDENT_AMBULATORY_CARE_PROVIDER_SITE_OTHER): Payer: Commercial Managed Care - HMO | Admitting: Nurse Practitioner

## 2022-05-11 VITALS — BP 130/82 | HR 86 | Temp 97.7°F | Ht 72.0 in | Wt 203.0 lb

## 2022-05-11 DIAGNOSIS — R7303 Prediabetes: Secondary | ICD-10-CM

## 2022-05-11 DIAGNOSIS — I7090 Unspecified atherosclerosis: Secondary | ICD-10-CM | POA: Diagnosis not present

## 2022-05-11 DIAGNOSIS — I10 Essential (primary) hypertension: Secondary | ICD-10-CM

## 2022-05-11 LAB — POCT GLYCOSYLATED HEMOGLOBIN (HGB A1C): Hemoglobin A1C: 6.2 % — AB (ref 4.0–5.6)

## 2022-05-11 NOTE — Patient Instructions (Signed)

## 2022-06-28 ENCOUNTER — Ambulatory Visit (INDEPENDENT_AMBULATORY_CARE_PROVIDER_SITE_OTHER): Payer: Commercial Managed Care - HMO | Admitting: Physician Assistant

## 2022-06-28 ENCOUNTER — Encounter: Payer: Self-pay | Admitting: Physician Assistant

## 2022-06-28 VITALS — BP 113/65 | HR 93 | Temp 98.3°F

## 2022-06-28 DIAGNOSIS — J449 Chronic obstructive pulmonary disease, unspecified: Secondary | ICD-10-CM

## 2022-06-28 DIAGNOSIS — J988 Other specified respiratory disorders: Secondary | ICD-10-CM

## 2022-06-28 DIAGNOSIS — B9689 Other specified bacterial agents as the cause of diseases classified elsewhere: Secondary | ICD-10-CM

## 2022-06-28 MED ORDER — IPRATROPIUM-ALBUTEROL 0.5-2.5 (3) MG/3ML IN SOLN: 3.0000 mL | Freq: Four times a day (QID) | RESPIRATORY_TRACT | 0 refills | Status: DC | PRN

## 2022-06-28 MED ORDER — METHYLPREDNISOLONE SODIUM SUCC 125 MG IJ SOLR
125.0000 mg | Freq: Once | INTRAMUSCULAR | Status: AC
  Administered 2022-06-28: 125 mg via INTRAMUSCULAR

## 2022-06-28 MED ORDER — DOXYCYCLINE HYCLATE 100 MG PO TABS: 100.0000 mg | ORAL_TABLET | Freq: Two times a day (BID) | ORAL | 0 refills | Status: DC

## 2022-06-28 MED ORDER — CEFTRIAXONE SODIUM 500 MG IJ SOLR
500.0000 mg | Freq: Once | INTRAMUSCULAR | Status: AC
  Administered 2022-06-28: 500 mg via INTRAMUSCULAR

## 2022-06-28 NOTE — Progress Notes (Signed)
Established patient acute visit   Patient: James Stokes   DOB: July 31, 1956   66 y.o. Male  MRN: [id number] Visit Date: 06/28/2022  No chief complaint on file.  Subjective    HPI  Patient presents with c/o productive cough with green sputum. Also reports shortness of breath, chills and hot sweats, low grade fever and is feeling worse. Symptoms started Wednesday morning of last week. No altered mental status or syncope. Patient does have a hx of COPD and uses oxygen at night. Patient reports his mother was recently hospitalized and diagnosed with pneumonia. He has been visiting her.      Medications: Outpatient Medications Prior to Visit  Medication Sig   albuterol (PROVENTIL HFA;VENTOLIN HFA) 108 (90 Base) MCG/ACT inhaler Inhale 2 puffs into the lungs every 6 (six) hours as needed for wheezing or shortness of breath (cough, shortness of breath or wheezing.).   aspirin 81 MG tablet Take 81 mg by mouth every morning.    diltiazem (CARDIZEM CD) 300 MG 24 hr capsule Take 1 capsule (300 mg total) by mouth daily.   EPINEPHrine (EPIPEN 2-PAK) 0.3 mg/0.3 mL IJ SOAJ injection Inject 0.3 mg into the muscle as needed for anaphylaxis.   hydrochlorothiazide (MICROZIDE) 12.5 MG capsule TAKE 1 CAPSULE BY MOUTH ONCE A DAY   losartan (COZAAR) 100 MG tablet Take 1 tablet (100 mg total) by mouth daily.   Multiple Vitamins-Minerals (MULTIVITAMIN ADULT PO) Take 1 tablet by mouth every morning.    rosuvastatin (CRESTOR) 20 MG tablet Take 1 tablet (20 mg total) by mouth daily.   No facility-administered medications prior to visit.    Review of Systems Review of Systems:  A fourteen system review of systems was performed and found to be positive as per HPI.  Last CBC Lab Results  Component Value Date   WBC 7.2 08/04/2021   HGB 16.0 08/04/2021   HCT 45.7 08/04/2021   MCV 91 08/04/2021   MCH 31.9 08/04/2021   RDW 13.2 08/04/2021   PLT 215 61/60/7371   Last metabolic panel Lab Results   Component Value Date   GLUCOSE 100 (H) 05/01/2021   NA 140 05/01/2021   K 4.4 05/01/2021   CL 100 05/01/2021   CO2 21 05/01/2021   BUN 23 05/01/2021   CREATININE 1.27 05/01/2021   EGFR 64 05/01/2021   CALCIUM 8.9 05/01/2021   PHOS 4.7 (H) 08/22/2018   PROT 6.9 12/02/2020   ALBUMIN 4.3 12/02/2020   LABGLOB 2.6 12/02/2020   AGRATIO 1.7 12/02/2020   BILITOT 0.3 12/02/2020   ALKPHOS 101 12/02/2020   AST 21 12/02/2020   ALT 31 12/02/2020   ANIONGAP 7 08/22/2018   Last lipids Lab Results  Component Value Date   CHOL 155 08/28/2021   HDL 31 (L) 08/28/2021   LDLCALC 53 08/28/2021   LDLDIRECT 41 08/28/2021   TRIG 477 (H) 08/28/2021   CHOLHDL 5.0 08/28/2021   Last hemoglobin A1c Lab Results  Component Value Date   HGBA1C 6.2 (A) 05/11/2022       Objective    BP 113/65   Pulse 93   Temp 98.3 F (36.8 C) (Temporal)   SpO2 90%    Physical Exam  General:  Cooperative, in no acute distress, non-toxic appearing. Neuro:  Alert and oriented,  extra-ocular muscles intact  HEENT:  Normocephalic, atraumatic, neck supple  Skin:  no gross rash, warm, pink. Cardiac:  RRR, S1 S2 Respiratory: Rhonchi with wheezing, no crackles or rales. Vascular:  Ext warm,  no cyanosis apprec.; cap RF less 2 sec. Psych:  No HI/SI, judgement and insight good, Euthymic mood. Full Affect.   No results found for any visits on 06/28/22.  Assessment & Plan     Patient presenting with worsening lower respiratory symptoms concerning for bacterial pneumonia so will administer ceftriaxone 500 mg in office and start oral doxycycline 100 mg BID x 10 days. Patient with hx of COPD with adventitious sounds on exam so will also administer Solu-Medrol 125 mg in office. Recommend duo-nebulizer treatments every 6 hrs as needed for shortness of breath. Continue with nighttime use of oxygen. Also discussed retesting at home for Covid. Advised to let me know if symptoms fail to improve or worsen.    Return if  symptoms worsen or fail to improve.        Lorrene Reid, PA-C  [facility] at [facility] [phone] (phone) [phone] (fax)  [place]

## 2022-06-28 NOTE — Patient Instructions (Signed)

## 2022-06-30 ENCOUNTER — Telehealth: Payer: Self-pay | Admitting: Internal Medicine

## 2022-06-30 NOTE — Telephone Encounter (Signed)
*  STAT* If patient is at the pharmacy, call can be transferred to refill team.   1. Which medications need to be refilled? (please list name of each medication and dose if known) diltiazem (CARDIZEM CD) 300 MG 24 hr capsule rosuvastatin (CRESTOR) 20 MG tablet  2. Which pharmacy/location (including street and city if local pharmacy) is medication to be sent to? [place], [place]  3. Do they need a 30 day or 90 day supply?  30 + refills   Patient is scheduled for 12/06 with Dr. Debara Pickett.

## 2022-07-01 MED ORDER — DILTIAZEM HCL ER COATED BEADS 300 MG PO CP24: 300.0000 mg | ORAL_CAPSULE | Freq: Every day | ORAL | 3 refills | Status: DC

## 2022-07-01 MED ORDER — ROSUVASTATIN CALCIUM 20 MG PO TABS: 20.0000 mg | ORAL_TABLET | Freq: Every day | ORAL | 3 refills | Status: DC

## 2022-07-21 ENCOUNTER — Telehealth: Payer: Self-pay

## 2022-07-21 NOTE — Telephone Encounter (Signed)
Patients wife called office to if he could get medication sent to pharmacy for his coughing and mucus, please advise, thanks!!

## 2022-07-21 NOTE — Telephone Encounter (Signed)
With his histolry and recent treatment for bacterial respiratory infection, he should be seen for appointment. In the meantime, he may take some Mucinex DM to help reduce cough and mucus.

## 2022-07-22 ENCOUNTER — Encounter: Payer: Self-pay | Admitting: Physician Assistant

## 2022-07-22 ENCOUNTER — Ambulatory Visit (INDEPENDENT_AMBULATORY_CARE_PROVIDER_SITE_OTHER): Payer: Commercial Managed Care - HMO | Admitting: Physician Assistant

## 2022-07-22 VITALS — BP 138/65 | HR 89 | Wt 205.1 lb

## 2022-07-22 DIAGNOSIS — R052 Subacute cough: Secondary | ICD-10-CM | POA: Diagnosis not present

## 2022-07-22 MED ORDER — PSEUDOEPH-BROMPHEN-DM 30-2-10 MG/5ML PO SYRP: 5.0000 mL | ORAL_SOLUTION | Freq: Three times a day (TID) | ORAL | 0 refills | Status: DC | PRN

## 2022-07-22 NOTE — Patient Instructions (Signed)

## 2022-07-22 NOTE — Progress Notes (Signed)
Established patient visit   Patient: James Stokes   DOB: [birth year]   66 y.o. Male  MRN: [id number] Visit Date: 07/22/2022  Chief Complaint  Patient presents with   Cough   Subjective    HPI  Patient presents with c/o continued dry cough and at times is productive, states when he is able to get rid of the sputum feels better. Sputum is white. No fever. States this week is feeling better than last week and has more energy. Sometimes has shortness of breath with activity mostly. Denies wheezing, chest pain or palpitations. Patient reports duo-neb did not help, states it actually lowered his oxygen so stopped doing any further breathing treatments. Repots has albuterol inhaler which he uses from time to time. Has not tried anything for his cough yet.   Medications: Outpatient Medications Prior to Visit  Medication Sig   albuterol (PROVENTIL HFA;VENTOLIN HFA) 108 (90 Base) MCG/ACT inhaler Inhale 2 puffs into the lungs every 6 (six) hours as needed for wheezing or shortness of breath (cough, shortness of breath or wheezing.).   aspirin 81 MG tablet Take 81 mg by mouth every morning.    diltiazem (CARDIZEM CD) 300 MG 24 hr capsule Take 1 capsule (300 mg total) by mouth daily.   EPINEPHrine (EPIPEN 2-PAK) 0.3 mg/0.3 mL IJ SOAJ injection Inject 0.3 mg into the muscle as needed for anaphylaxis.   hydrochlorothiazide (MICROZIDE) 12.5 MG capsule TAKE 1 CAPSULE BY MOUTH ONCE A DAY   ipratropium-albuterol (DUONEB) 0.5-2.5 (3) MG/3ML SOLN Take 3 mLs by nebulization every 6 (six) hours as needed.   losartan (COZAAR) 100 MG tablet Take 1 tablet (100 mg total) by mouth daily.   Multiple Vitamins-Minerals (MULTIVITAMIN ADULT PO) Take 1 tablet by mouth every morning.    rosuvastatin (CRESTOR) 20 MG tablet Take 1 tablet (20 mg total) by mouth daily.   [DISCONTINUED] doxycycline (VIBRA-TABS) 100 MG tablet Take 1 tablet (100 mg total) by mouth 2 (two) times daily.   No facility-administered  medications prior to visit.    Review of Systems Review of Systems:  A fourteen system review of systems was performed and found to be positive as per HPI.  Last CBC Lab Results  Component Value Date   WBC 7.2 08/04/2021   HGB 16.0 08/04/2021   HCT 45.7 08/04/2021   MCV 91 08/04/2021   MCH 31.9 08/04/2021   RDW 13.2 08/04/2021   PLT 215 82/64/1583   Last metabolic panel Lab Results  Component Value Date   GLUCOSE 100 (H) 05/01/2021   NA 140 05/01/2021   K 4.4 05/01/2021   CL 100 05/01/2021   CO2 21 05/01/2021   BUN 23 05/01/2021   CREATININE 1.27 05/01/2021   EGFR 64 05/01/2021   CALCIUM 8.9 05/01/2021   PHOS 4.7 (H) 08/22/2018   PROT 6.9 12/02/2020   ALBUMIN 4.3 12/02/2020   LABGLOB 2.6 12/02/2020   AGRATIO 1.7 12/02/2020   BILITOT 0.3 12/02/2020   ALKPHOS 101 12/02/2020   AST 21 12/02/2020   ALT 31 12/02/2020   ANIONGAP 7 08/22/2018   Last lipids Lab Results  Component Value Date   CHOL 155 08/28/2021   HDL 31 (L) 08/28/2021   LDLCALC 53 08/28/2021   LDLDIRECT 41 08/28/2021   TRIG 477 (H) 08/28/2021   CHOLHDL 5.0 08/28/2021   Last hemoglobin A1c Lab Results  Component Value Date   HGBA1C 6.2 (A) 05/11/2022   Last thyroid functions Lab Results  Component Value Date   TSH 3.400  08/04/2021       Objective    BP 138/65   Pulse 89   Wt 205 lb 1.9 oz (93 kg)   SpO2 93%   BMI 27.82 kg/m  BP Readings from Last 3 Encounters:  07/22/22 138/65  06/28/22 113/65  05/11/22 130/82   Wt Readings from Last 3 Encounters:  07/22/22 205 lb 1.9 oz (93 kg)  05/11/22 203 lb (92.1 kg)  02/08/22 207 lb (93.9 kg)    Physical Exam  General:  Well Developed, well nourished, appropriate for stated age.  Neuro:  Alert and oriented,  extra-ocular muscles intact  HEENT:  Normocephalic, atraumatic, normal TM's of both ears, neck supple  Skin:  no gross rash, warm, pink. Cardiac:  RRR, S1 S2 Respiratory: CTA B/L w/o wheezing, crackles, rales or  rhonchi. Vascular:  Ext warm, no cyanosis apprec.; cap RF less 2 sec. Psych:  No HI/SI, judgement and insight good, Euthymic mood. Full Affect.   No results found for any visits on 07/22/22.  Assessment & Plan      Problem List Items Addressed This Visit   None Visit Diagnoses     Subacute cough    -  Primary   Relevant Medications   brompheniramine-pseudoephedrine-DM 30-2-10 MG/5ML syrup      Patient was treated for a bacterial lower respiratory infection about 3 weeks ago and completed a 10-day course of doxycyline with major improvement of symptoms except lingering cough. Discussed management options and wants to try cough syrup so will send rx for Bromfed to take as prescribed. Advised to monitor BP/pulse.   Return if symptoms worsen or fail to improve.        Lorrene Reid, PA-C  [facility] at [facility] [phone] (phone) [phone] (fax)  [place]

## 2022-08-12 ENCOUNTER — Ambulatory Visit (INDEPENDENT_AMBULATORY_CARE_PROVIDER_SITE_OTHER): Payer: Commercial Managed Care - HMO | Admitting: Nurse Practitioner

## 2022-08-12 ENCOUNTER — Encounter: Payer: Self-pay | Admitting: Nurse Practitioner

## 2022-08-12 VITALS — BP 137/69 | HR 97 | Ht 72.0 in | Wt 205.1 lb

## 2022-08-12 DIAGNOSIS — J449 Chronic obstructive pulmonary disease, unspecified: Secondary | ICD-10-CM

## 2022-08-12 DIAGNOSIS — R5383 Other fatigue: Secondary | ICD-10-CM

## 2022-08-12 DIAGNOSIS — Z0001 Encounter for general adult medical examination with abnormal findings: Secondary | ICD-10-CM

## 2022-08-12 DIAGNOSIS — Z125 Encounter for screening for malignant neoplasm of prostate: Secondary | ICD-10-CM

## 2022-08-12 DIAGNOSIS — Z23 Encounter for immunization: Secondary | ICD-10-CM

## 2022-08-12 DIAGNOSIS — R7303 Prediabetes: Secondary | ICD-10-CM

## 2022-08-12 DIAGNOSIS — Z87891 Personal history of nicotine dependence: Secondary | ICD-10-CM

## 2022-08-12 DIAGNOSIS — Z0000 Encounter for general adult medical examination without abnormal findings: Secondary | ICD-10-CM

## 2022-08-12 DIAGNOSIS — I10 Essential (primary) hypertension: Secondary | ICD-10-CM

## 2022-08-12 NOTE — Progress Notes (Signed)
Complete physical exam   Patient: James Stokes   DOB: December 13, 1955   66 y.o. Male  MRN: [id number] Visit Date: 08/12/2022    Chief Complaint  Patient presents with   Annual Exam   Subjective    James Stokes is a 66 y.o. male who presents today for a complete physical exam.  He reports consuming a  generally healthy, low cholesterol  diet.  He generally feels well. He does not have additional problems to discuss today.   HPI  Annual physical  -recently treated for bacterial upper respiratory infection.  --states that he has some lingering congestion, but is feeling better  -history of coronary artery disease - routinely sees cardiology  -no new concerns or complaints    Past Medical History:  Diagnosis Date   Acute diastolic CHF (congestive heart failure) ([place]) 08/22/2018   Atherosclerotic plaque on cardiac vessels, seen on CT chest 08/22/2018   Dyslipidemia    ETOH abuse    HTN (hypertension)    Hypertriglyceridemia    Kidney stones    Sleep apnea in adult 08/29/2018   C-pap ordered June 2020- Dr Halford Chessman reviewed the study   Smoker    Past Surgical History:  Procedure Laterality Date   HERNIA REPAIR     pt was 46 month old   [place]     double hernia pt unsure of exact date, DR. Ballen did surgery   lipoma  05/1989   fatty tumor on back   Social History   Socioeconomic History   Marital status: Married    Spouse name: Not on file   Number of children: Not on file   Years of education: Not on file   Highest education level: Not on file  Occupational History   Not on file  Tobacco Use   Smoking status: Former    Packs/day: 2.00    Years: 43.00    Total pack years: 86.00    Types: Cigarettes    Quit date: 08/17/2018    Years since quitting: 4.0   Smokeless tobacco: Never  Vaping Use   Vaping Use: Never used  Substance and Sexual Activity   Alcohol use: Yes    Alcohol/week: 6.0 standard drinks of alcohol    Types: 6 Cans of beer per week     Comment: 1-2 beers a few days a week    Drug use: Yes    Types: Marijuana   Sexual activity: Not on file  Other Topics Concern   Not on file  Social History Narrative   Not on file   Social Determinants of Health   Financial Resource Strain: Not on file  Food Insecurity: Not on file  Transportation Needs: Not on file  Physical Activity: Not on file  Stress: Not on file  Social Connections: Not on file  Intimate Partner Violence: Not on file   Family Status  Relation Name Status   Mother  Alive   Father  Deceased   Ethlyn Daniels  (Not Specified)   Annamarie Major  (Not Specified)   Family History  Problem Relation Age of Onset   COPD Mother    Vascular Disease Father 75       aneurysm   Hyperlipidemia Father    Hypertension Father    Cancer Father    Diabetes Paternal Aunt    Diabetes Paternal Uncle    Allergies  Allergen Reactions   Other Hives and Itching   Shrimp [Shellfish Allergy] Itching and  Rash    Patient Care Team: Ronnell Freshwater, NP as PCP - General (Family Medicine) Lorretta Harp, MD as PCP - Cardiology (Cardiology) Brand Males, MD as Consulting Physician (Pulmonary Disease)   Medications: Outpatient Medications Prior to Visit  Medication Sig   albuterol (PROVENTIL HFA;VENTOLIN HFA) 108 (90 Base) MCG/ACT inhaler Inhale 2 puffs into the lungs every 6 (six) hours as needed for wheezing or shortness of breath (cough, shortness of breath or wheezing.).   aspirin 81 MG tablet Take 81 mg by mouth every morning.    brompheniramine-pseudoephedrine-DM 30-2-10 MG/5ML syrup Take 5 mLs by mouth 3 (three) times daily as needed.   diltiazem (CARDIZEM CD) 300 MG 24 hr capsule Take 1 capsule (300 mg total) by mouth daily.   EPINEPHrine (EPIPEN 2-PAK) 0.3 mg/0.3 mL IJ SOAJ injection Inject 0.3 mg into the muscle as needed for anaphylaxis.   hydrochlorothiazide (MICROZIDE) 12.5 MG capsule TAKE 1 CAPSULE BY MOUTH ONCE A DAY   losartan (COZAAR) 100 MG tablet Take 1  tablet (100 mg total) by mouth daily.   Multiple Vitamins-Minerals (MULTIVITAMIN ADULT PO) Take 1 tablet by mouth every morning.    rosuvastatin (CRESTOR) 20 MG tablet Take 1 tablet (20 mg total) by mouth daily.   [DISCONTINUED] ipratropium-albuterol (DUONEB) 0.5-2.5 (3) MG/3ML SOLN Take 3 mLs by nebulization every 6 (six) hours as needed.   No facility-administered medications prior to visit.    Review of Systems  Constitutional:  Negative for activity change, chills, fatigue and fever.  HENT:  Negative for congestion, postnasal drip, rhinorrhea, sinus pressure, sinus pain, sneezing and sore throat.   Eyes: Negative.   Respiratory:  Positive for cough. Negative for shortness of breath and wheezing.        Lingering cough and congestion after treatment for bacterial respiratory infection   Cardiovascular:  Negative for chest pain and palpitations.  Gastrointestinal:  Negative for constipation, diarrhea, nausea and vomiting.  Endocrine: Negative for cold intolerance, heat intolerance, polydipsia and polyuria.       Blood sugars doing well    Genitourinary:  Negative for dysuria, frequency and urgency.  Musculoskeletal:  Negative for back pain and myalgias.  Skin:  Negative for rash.  Allergic/Immunologic: Negative for environmental allergies.  Neurological:  Negative for dizziness, weakness and headaches.  Psychiatric/Behavioral:  The patient is not nervous/anxious.        Objective     Today's Vitals   08/12/22 0834  BP: 137/69  Pulse: 97  SpO2: 94%  Weight: 205 lb 1.9 oz (93 kg)  Height: 6' (1.829 m)   Body mass index is 27.82 kg/m.  BP Readings from Last 3 Encounters:  08/12/22 137/69  07/22/22 138/65  06/28/22 113/65    Wt Readings from Last 3 Encounters:  08/12/22 205 lb 1.9 oz (93 kg)  07/22/22 205 lb 1.9 oz (93 kg)  05/11/22 203 lb (92.1 kg)     Physical Exam Vitals and nursing note reviewed.  Constitutional:      Appearance: Normal appearance. He is  well-developed.  HENT:     Head: Normocephalic and atraumatic.     Right Ear: Tympanic membrane, ear canal and external ear normal.     Left Ear: Tympanic membrane, ear canal and external ear normal.     Nose: Nose normal.     Mouth/Throat:     Mouth: Mucous membranes are moist.     Pharynx: Oropharynx is clear.  Eyes:     Extraocular Movements: Extraocular movements intact.  Conjunctiva/sclera: Conjunctivae normal.     Pupils: Pupils are equal, round, and reactive to light.  Neck:     Vascular: No carotid bruit.  Cardiovascular:     Rate and Rhythm: Normal rate and regular rhythm.     Pulses: Normal pulses.     Heart sounds: Normal heart sounds.  Pulmonary:     Effort: Pulmonary effort is normal.     Breath sounds: Normal breath sounds.  Abdominal:     General: Bowel sounds are normal. There is no distension.     Palpations: Abdomen is soft. There is no mass.     Tenderness: There is no abdominal tenderness. There is no right CVA tenderness, left CVA tenderness, guarding or rebound.     Hernia: No hernia is present.  Musculoskeletal:        General: Normal range of motion.     Cervical back: Normal range of motion and neck supple.  Lymphadenopathy:     Cervical: No cervical adenopathy.  Skin:    General: Skin is warm and dry.     Capillary Refill: Capillary refill takes less than 2 seconds.  Neurological:     General: No focal deficit present.     Mental Status: He is alert and oriented to person, place, and time.  Psychiatric:        Mood and Affect: Mood normal.        Behavior: Behavior normal.        Thought Content: Thought content normal.        Judgment: Judgment normal.     Last depression screening scores   Row Labels 08/12/2022    8:37 AM 05/11/2022    9:12 AM 02/08/2022    8:30 AM  PHQ 2/9 Scores   Section Header. No data exists in this row.     PHQ - 2 Score   0 0 0  PHQ- 9 Score   0 0 0   Last fall risk screening   Row Labels 05/11/2022    9:12  AM  Fall Risk    Section Header. No data exists in this row.   Falls in the past year?   0  Number falls in past yr:   0  Injury with Fall?   0  Risk for fall due to :   No Fall Risks  Follow up   Falls evaluation completed     Results for orders placed or performed in visit on 08/12/22  TSH + free T4  Result Value Ref Range   TSH 2.440 0.450 - 4.500 uIU/mL   Free T4 1.13 0.82 - 1.77 ng/dL  PSA  Result Value Ref Range   Prostate Specific Ag, Serum 1.0 0.0 - 4.0 ng/mL  Comprehensive metabolic panel  Result Value Ref Range   Glucose 195 (H) 70 - 99 mg/dL   BUN 20 8 - 27 mg/dL   Creatinine, Ser 1.16 0.76 - 1.27 mg/dL   eGFR 70 >59 mL/min/1.73   BUN/Creatinine Ratio 17 10 - 24   Sodium 143 134 - 144 mmol/L   Potassium 4.0 3.5 - 5.2 mmol/L   Chloride 103 96 - 106 mmol/L   CO2 26 20 - 29 mmol/L   Calcium 9.2 8.6 - 10.2 mg/dL   Total Protein 6.8 6.0 - 8.5 g/dL   Albumin 4.2 3.9 - 4.9 g/dL   Globulin, Total 2.6 1.5 - 4.5 g/dL   Albumin/Globulin Ratio 1.6 1.2 - 2.2   Bilirubin Total 0.4 0.0 -  1.2 mg/dL   Alkaline Phosphatase 101 44 - 121 IU/L   AST 18 0 - 40 IU/L   ALT 25 0 - 44 IU/L  CBC  Result Value Ref Range   WBC 8.1 3.4 - 10.8 x10E3/uL   RBC 4.90 4.14 - 5.80 x10E6/uL   Hemoglobin 15.3 13.0 - 17.7 g/dL   Hematocrit 45.2 37.5 - 51.0 %   MCV 92 79 - 97 fL   MCH 31.2 26.6 - 33.0 pg   MCHC 33.8 31.5 - 35.7 g/dL   RDW 13.4 11.6 - 15.4 %   Platelets 234 150 - 450 x10E3/uL  Hemoglobin A1c  Result Value Ref Range   Hgb A1c MFr Bld 6.4 (H) 4.8 - 5.6 %   Est. average glucose Bld gHb Est-mCnc 137 mg/dL    Assessment & Plan    1. Encounter for general adult medical examination with abnormal findings Annual physical today   2. Essential hypertension Stable. Continue bp medication as prescribed   3. Prediabetes HgbA1c 6.0 today. Continue low carbohydrate, low sugar diet.  - Comprehensive metabolic panel - CBC - Hemoglobin A1c  4. Other fatigue Check thyroid panel  today  - TSH + free T4  5. COPD with hypoxia ([place]) Will get CT lung cancer screening.  - CT CHEST LUNG CANCER SCREENING LOW DOSE WO CONTRAST; Future  6. Former cigarette smoker Will get CT lung cancer screening.  - CT CHEST LUNG CANCER SCREENING LOW DOSE WO CONTRAST; Future  7. Need for Tdap vaccination Tdap vaccine administered during today's visit.   - Tdap vaccine greater than or equal to 7yo IM  8. Screening for prostate cancer Check PSA  - PSA  9. Healthcare maintenance Routine, fasting labs drawn during today's visit.  - TSH + free T4 - Comprehensive metabolic panel - CBC   Immunization History  Administered Date(s) Administered   Tdap 08/12/2022    Health Maintenance  Topic Date Due   Hepatitis C Screening  Never done   Lung Cancer Screening  08/19/2019   COVID-19 Vaccine (1) 08/28/2022 (Originally 05/07/1961)   Zoster Vaccines- Shingrix (1 of 2) 11/12/2022 (Originally 05/06/1977)   INFLUENZA VACCINE  12/26/2022 (Originally 04/27/2022)   COLONOSCOPY (Pts 45-69yr Insurance coverage will need to be confirmed)  08/13/2023 (Originally 05/07/2003)   DTaP/Tdap/Td (2 - Td or Tdap) 08/12/2032   HIV Screening  Completed   HPV VACCINES  Aged Out    Discussed health benefits of physical activity, and encouraged him to engage in regular exercise appropriate for his age and condition.  Problem List Items Addressed This Visit       Cardiovascular and Mediastinum   Essential hypertension (Chronic)     Respiratory   COPD with hypoxia ([facility]) (Chronic)   Relevant Orders   CT CHEST LUNG CANCER SCREENING LOW DOSE WO CONTRAST     Other   Prediabetes   Relevant Orders   Comprehensive metabolic panel (Completed)   CBC (Completed)   Hemoglobin A1c (Completed)   Other fatigue   Relevant Orders   TSH + free T4 (Completed)   Former cigarette smoker   Relevant Orders   CT CHEST LUNG CANCER SCREENING LOW DOSE WO CONTRAST   Other Visit Diagnoses     Encounter for general  adult medical examination with abnormal findings    -  Primary   Need for Tdap vaccination       Relevant Orders   Tdap vaccine greater than or equal to 7yo IM (Completed)   Screening  for prostate cancer       Relevant Orders   PSA (Completed)   Healthcare maintenance       Relevant Orders   TSH + free T4 (Completed)   Comprehensive metabolic panel (Completed)   CBC (Completed)        Return in about 6 months (around 02/10/2023) for prediabetes with check HgbA1c .        Ronnell Freshwater, NP  [facility] at [facility] [phone] (phone) [phone] (fax)  [place]

## 2022-08-13 LAB — CBC
Hematocrit: 45.2 % (ref 37.5–51.0)
Hemoglobin: 15.3 g/dL (ref 13.0–17.7)
MCH: 31.2 pg (ref 26.6–33.0)
MCHC: 33.8 g/dL (ref 31.5–35.7)
MCV: 92 fL (ref 79–97)
Platelets: 234 10*3/uL (ref 150–450)
RBC: 4.9 x10E6/uL (ref 4.14–5.80)
RDW: 13.4 % (ref 11.6–15.4)
WBC: 8.1 10*3/uL (ref 3.4–10.8)

## 2022-08-13 LAB — HEMOGLOBIN A1C
Est. average glucose Bld gHb Est-mCnc: 137 mg/dL
Hgb A1c MFr Bld: 6.4 % — ABNORMAL HIGH (ref 4.8–5.6)

## 2022-08-13 LAB — COMPREHENSIVE METABOLIC PANEL
ALT: 25 IU/L (ref 0–44)
AST: 18 IU/L (ref 0–40)
Albumin/Globulin Ratio: 1.6 (ref 1.2–2.2)
Albumin: 4.2 g/dL (ref 3.9–4.9)
Alkaline Phosphatase: 101 IU/L (ref 44–121)
BUN/Creatinine Ratio: 17 (ref 10–24)
BUN: 20 mg/dL (ref 8–27)
Bilirubin Total: 0.4 mg/dL (ref 0.0–1.2)
CO2: 26 mmol/L (ref 20–29)
Calcium: 9.2 mg/dL (ref 8.6–10.2)
Chloride: 103 mmol/L (ref 96–106)
Creatinine, Ser: 1.16 mg/dL (ref 0.76–1.27)
Globulin, Total: 2.6 g/dL (ref 1.5–4.5)
Glucose: 195 mg/dL — ABNORMAL HIGH (ref 70–99)
Potassium: 4 mmol/L (ref 3.5–5.2)
Sodium: 143 mmol/L (ref 134–144)
Total Protein: 6.8 g/dL (ref 6.0–8.5)
eGFR: 70 mL/min/{1.73_m2} (ref >59)

## 2022-08-13 LAB — PSA: Prostate Specific Ag, Serum: 1 ng/mL (ref 0.0–4.0)

## 2022-08-13 LAB — TSH+FREE T4
Free T4: 1.13 ng/dL (ref 0.82–1.77)
TSH: 2.44 u[IU]/mL (ref 0.450–4.500)

## 2022-08-27 DIAGNOSIS — R5383 Other fatigue: Secondary | ICD-10-CM | POA: Insufficient documentation

## 2022-08-27 DIAGNOSIS — Z87891 Personal history of nicotine dependence: Secondary | ICD-10-CM | POA: Insufficient documentation

## 2022-09-01 ENCOUNTER — Ambulatory Visit (HOSPITAL_BASED_OUTPATIENT_CLINIC_OR_DEPARTMENT_OTHER): Payer: Commercial Managed Care - HMO | Admitting: Internal Medicine

## 2022-09-01 ENCOUNTER — Encounter (HOSPITAL_BASED_OUTPATIENT_CLINIC_OR_DEPARTMENT_OTHER): Payer: Self-pay | Admitting: Internal Medicine

## 2022-09-01 VITALS — BP 162/92 | HR 90 | Ht 72.0 in | Wt 205.0 lb

## 2022-09-01 DIAGNOSIS — E785 Hyperlipidemia, unspecified: Secondary | ICD-10-CM | POA: Diagnosis not present

## 2022-09-01 DIAGNOSIS — I5032 Chronic diastolic (congestive) heart failure: Secondary | ICD-10-CM | POA: Diagnosis not present

## 2022-09-01 DIAGNOSIS — I7090 Unspecified atherosclerosis: Secondary | ICD-10-CM

## 2022-09-01 DIAGNOSIS — E782 Mixed hyperlipidemia: Secondary | ICD-10-CM | POA: Diagnosis not present

## 2022-09-01 NOTE — Patient Instructions (Signed)
Medication Instructions:  NO CHANGES  *If you need a refill on your cardiac medications before your next appointment, please call your pharmacy*   Lab Work: FASTING lab work to check cholesterol as soon as able   If you have labs (blood work) drawn today and your tests are completely normal, you will receive your results only by: [place] (if you have MyChart) OR A paper copy in the mail If you have any lab test that is abnormal or we need to change your treatment, we will call you to review the results.   Follow-Up: At [facility], you and your health needs are our priority.  As part of our continuing mission to provide you with exceptional heart care, we have created designated Provider Care Teams.  These Care Teams include your primary Cardiologist (physician) and Advanced Practice Providers (APPs -  Physician Assistants and Nurse Practitioners) who all work together to provide you with the care you need, when you need it.  We recommend signing up for the patient portal called "MyChart".  Sign up information is provided on this After Visit Summary.  MyChart is used to connect with patients for Virtual Visits (Telemedicine).  Patients are able to view lab/test results, encounter notes, upcoming appointments, etc.  Non-urgent messages can be sent to your provider as well.   To learn more about what you can do with MyChart, go to [email or web address].    Your next appointment:   12 month(s)  The format for your next appointment:   In Person  Provider:   Lyman Bishop MD

## 2022-09-01 NOTE — Progress Notes (Signed)
LIPID CLINIC CONSULT NOTE  Chief Complaint:  Follow-up dyslipidemia  Primary Care Physician: Ronnell Freshwater, NP  Primary Cardiologist:  [name], MD  HPI:  James Stokes is a 66 y.o. male who is being seen today for the evaluation of dyslipidemia at the request of Ronnell Freshwater, NP. James Stokes is a 66 y.o. male who presents via audio/video conferencing for a telehealth visit today. James Stokes is a pleasant 66 year old male kindly referred by Dr. Alvester Chou for evaluation and management of dyslipidemia. There is evidence for longstanding hypertriglyceridemia however he has not previously been on any therapies including a statin, fibrate or other lipid-lowering therapy. He was hospitalized in 9480 for COPD/diastolic heart failure exacerbation. He also has a history of some alcohol use which she is cut back significantly. He also stopped smoking 2 years ago. He has had better control of his blood pressure and is compliant with CPAP. 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 He has no known coronary disease and had a negative Myoview last in 2005 but was shown to have coronary calcification by CT in 2019. Recently his lipid profile showed a sharp rise in triglycerides with total cholesterol 353, triglycerides 1768, HDL 21 and LDL could not be calculated. In fact he has had multiple elevated triglycerides on blood work including 2 years ago at 52, 695 the year before that and 853 the year before that. The only direct LDL that I could find was in 2018 which was 76, suggesting this may be a primary hypertriglyceridemia. He reports a varied diet but does have reasonable amount of saturated fat intake and sweets.  12/09/2020  James Stokes returns today for follow-up.  Unfortunately he had switched insurances.  This led to him not being able to get his Crestor.  Finally he was able to get it and just started taking  it this past weekend.  This was after his recent lab work was drawn.  That lab work showed total cholesterol 257, HDL 31, LDL 93 and triglycerides 792.  This is a significant improvement from triglycerides at 1768 in the past.  He may be more active or has made some dietary changes since then.  04/23/2021  James Stokes is seen today for follow-up.  He continues to have marked improvement in his lipids.  He is tolerating the rosuvastatin without issues.  Cholesterol has come down significantly with total now 135, HDL 32, LDL 41 and triglycerides 427.  Previously triglycerides were 792 and prior to that 1768.  He has had no episodes of pancreatitis.  He says he is continuing to work on his diet.  08/17/2021  James Stokes returns today for follow-up.  He had recent labs at his primary care doctor's office unfortunately however they did not draw the lipid profile which we had ordered.  Triglycerides had continued to come down significantly, last were 4 and 27.  LDL cholesterol is down to 41.  This is all on rosuvastatin 20 mg daily.  He is also made dietary changes.  We discussed the possibility of adding a fibrate or omega-3 if his triglycerides remain elevated however they need to be reassessed now.  09/01/2022  James Stokes is seen today in follow-up.  He has no new complaints.  He denies chest pain or shortness of breath.  Blood pressure was somewhat up today.  He reports compliance with his medications.  He has  not had recent lipid testing.  He said he would get that from his PCP.  His last labs were in December 2022 which showed a significant triglyceride elevation at 477.  PMHx:  Past Medical History:  Diagnosis Date   Acute diastolic CHF (congestive heart failure) ([place]) 08/22/2018   Atherosclerotic plaque on cardiac vessels, seen on CT chest 08/22/2018   Dyslipidemia    ETOH abuse    HTN (hypertension)    Hypertriglyceridemia    Kidney stones    Sleep apnea in adult 08/29/2018   C-pap ordered  June 2020- Dr Halford Chessman reviewed the study   Smoker     Past Surgical History:  Procedure Laterality Date   HERNIA REPAIR     pt was 58 month old   [place]     double hernia pt unsure of exact date, DR. Ballen did surgery   lipoma  05/1989   fatty tumor on back    FAMHx:  Family History  Problem Relation Age of Onset   COPD Mother    Vascular Disease Father 73       aneurysm   Hyperlipidemia Father    Hypertension Father    Cancer Father    Diabetes Paternal Aunt    Diabetes Paternal Uncle     SOCHx:   reports that he quit smoking about 4 years ago. His smoking use included cigarettes. He has a 86.00 pack-year smoking history. He has never used smokeless tobacco. He reports current alcohol use of about 6.0 standard drinks of alcohol per week. He reports current drug use. Drug: Marijuana.  ALLERGIES:  Allergies  Allergen Reactions   Other Hives and Itching   Shrimp [Shellfish Allergy] Itching and Rash    ROS: Pertinent items noted in HPI and remainder of comprehensive ROS otherwise negative.  HOME MEDS: Current Outpatient Medications on File Prior to Visit  Medication Sig Dispense Refill   albuterol (PROVENTIL HFA;VENTOLIN HFA) 108 (90 Base) MCG/ACT inhaler Inhale 2 puffs into the lungs every 6 (six) hours as needed for wheezing or shortness of breath (cough, shortness of breath or wheezing.). 1 Inhaler 1   aspirin 81 MG tablet Take 81 mg by mouth every morning.      diltiazem (CARDIZEM CD) 300 MG 24 hr capsule Take 1 capsule (300 mg total) by mouth daily. 30 capsule 3   EPINEPHrine (EPIPEN 2-PAK) 0.3 mg/0.3 mL IJ SOAJ injection Inject 0.3 mg into the muscle as needed for anaphylaxis. 1 each 1   hydrochlorothiazide (MICROZIDE) 12.5 MG capsule TAKE 1 CAPSULE BY MOUTH ONCE A DAY 30 capsule 11   losartan (COZAAR) 100 MG tablet Take 1 tablet (100 mg total) by mouth daily. 30 tablet 11   Multiple Vitamins-Minerals (MULTIVITAMIN ADULT PO) Take 1 tablet by mouth every morning.       rosuvastatin (CRESTOR) 20 MG tablet Take 1 tablet (20 mg total) by mouth daily. 30 tablet 3   No current facility-administered medications on file prior to visit.    LABS/IMAGING: No results found for this or any previous visit (from the past 48 hour(s)). No results found.  LIPID PANEL:    Component Value Date/Time   CHOL 155 08/28/2021 1042   TRIG 477 (H) 08/28/2021 1042   HDL 31 (L) 08/28/2021 1042   CHOLHDL 5.0 08/28/2021 1042   CHOLHDL 6.8 (H) 01/25/2017 0901   VLDL NOT CALC 01/25/2017 0901   LDLCALC 53 08/28/2021 1042   LDLDIRECT 41 08/28/2021 1043   LDLDIRECT 75 01/25/2017 0901  WEIGHTS: Wt Readings from Last 3 Encounters:  09/01/22 205 lb (93 kg)  08/12/22 205 lb 1.9 oz (93 kg)  07/22/22 205 lb 1.9 oz (93 kg)    VITALS: BP (!) 162/92   Pulse 90   Ht 6' (1.829 m)   Wt 205 lb (93 kg)   BMI 27.80 kg/m   EXAM: Deferred  EKG: Deferred  ASSESSMENT: Mixed dyslipidemia with primarily high triglycerides and low HDL Coronary artery calcification COPD, severe History of diastolic heart failure  PLAN: 1.   James Stokes has not had recent lipid testing.  Blood pressure was noted to be elevated today.  He says is generally better controlled at home.  I will plan repeat lipid profile consider treatment as necessary.  He reports compliance with a statin but if his triglycerides remain elevated and is not at target additional therapy may be recommended.  Pixie Casino, MD, [facility], [place] Director of the Advanced Lipid Disorders &  Cardiovascular Risk Reduction Clinic Diplomate of the American Board of Clinical Lipidology Attending Cardiologist  Direct Dial: [phone]  Fax: [phone]  Website:  [email or web address] 09/01/2022, 3:50 PM

## 2022-09-11 ENCOUNTER — Encounter: Payer: Self-pay | Admitting: Nurse Practitioner

## 2022-09-14 ENCOUNTER — Ambulatory Visit
Admission: RE | Admit: 2022-09-14 | Discharge: 2022-09-14 | Disposition: A | Discharge status: Home / Self Care | Payer: Commercial Managed Care - HMO | Source: Ambulatory Visit | Attending: Nurse Practitioner | Admitting: Nurse Practitioner

## 2022-09-14 DIAGNOSIS — Z87891 Personal history of nicotine dependence: Secondary | ICD-10-CM

## 2022-09-14 DIAGNOSIS — J449 Chronic obstructive pulmonary disease, unspecified: Secondary | ICD-10-CM

## 2022-09-20 NOTE — Progress Notes (Signed)
Elevation of glucose with stable HgbA1c. Other labs good.

## 2022-09-20 NOTE — Progress Notes (Signed)
Please let the patient know that I have reviewed his recent chest CT. There is a small nodule in right lower lung measuring 7.8 mm. It is considered probably benign.  It is recommended that we repeat the study in 6 months for surveillance. It also shows coronary artery disease and aortic atherosclerosis. He is monitored closely by cardiology for these. Thanks  -HB

## 2022-10-28 ENCOUNTER — Other Ambulatory Visit: Payer: Self-pay | Admitting: Internal Medicine

## 2022-11-02 ENCOUNTER — Telehealth: Payer: Self-pay | Admitting: *Deleted

## 2022-11-02 NOTE — Telephone Encounter (Signed)
Representative from [facility] calling to say that pts CT was denied and that a peer to peer or submission of clinical data supporting the need for this imaging could be sent to Fax [phone].  In reviewing pts chart it has an authorization already for this imaging.  I contacted Missy with [employer] at [phone] and she said that it was authorized on their end and that it should have been covered.  She was going to take care of sending this to the appropriate people to appeal the decision.

## 2022-11-17 NOTE — Telephone Encounter (Signed)
Pt wife calling wanting to check on the CT charges that pt had done.  She said she received a bill on Monday.  I informed her of below and then called Missy at [facility] and she is going to look into this but to give her a couple of days. I called pts wife and informed her of this as well. Jaymason Ledesma Zimmerman Rumple, CMA

## 2022-11-17 NOTE — Telephone Encounter (Signed)
Missy called back and she said that the appeal is still in review. Vladislav Axelson Zimmerman Rumple, CMA

## 2022-11-19 ENCOUNTER — Telehealth: Payer: Self-pay | Admitting: *Deleted

## 2022-11-19 NOTE — Telephone Encounter (Signed)
Opened in error

## 2023-01-03 ENCOUNTER — Other Ambulatory Visit: Payer: Self-pay | Admitting: Cardiovascular Disease

## 2023-02-10 ENCOUNTER — Ambulatory Visit: Payer: Commercial Managed Care - HMO | Admitting: Nurse Practitioner

## 2023-02-18 ENCOUNTER — Ambulatory Visit (INDEPENDENT_AMBULATORY_CARE_PROVIDER_SITE_OTHER): Payer: Commercial Managed Care - HMO | Admitting: Family Medicine

## 2023-02-18 ENCOUNTER — Encounter: Payer: Self-pay | Admitting: Family Medicine

## 2023-02-18 VITALS — BP 132/64 | HR 69 | Temp 97.8°F | Ht 72.0 in | Wt 201.0 lb

## 2023-02-18 DIAGNOSIS — I10 Essential (primary) hypertension: Secondary | ICD-10-CM | POA: Diagnosis not present

## 2023-02-18 DIAGNOSIS — E785 Hyperlipidemia, unspecified: Secondary | ICD-10-CM | POA: Diagnosis not present

## 2023-02-18 DIAGNOSIS — R7303 Prediabetes: Secondary | ICD-10-CM

## 2023-02-18 LAB — POCT GLYCOSYLATED HEMOGLOBIN (HGB A1C): Hemoglobin A1C: 6.2 % — AB (ref 4.0–5.6)

## 2023-02-18 MED ORDER — LOSARTAN POTASSIUM-HCTZ 100-25 MG PO TABS: 1.0000 | ORAL_TABLET | Freq: Every day | ORAL | 1 refills | Status: DC

## 2023-02-18 NOTE — Assessment & Plan Note (Signed)
Stable, recheck in 6 months.  If still stable can recheck yearly after that.

## 2023-02-18 NOTE — Assessment & Plan Note (Signed)
Increasing hydrochlorothiazide dose to 25 and combining it with losartan to Hyzaar.  Advised patient to discontinue his other blood pressure medications.  Recheck BMP in 2 weeks.

## 2023-02-18 NOTE — Progress Notes (Signed)
   Established Patient Office Visit  Subjective   Patient ID: James Stokes, male    DOB: [birth year]  Age: 67 y.o. MRN: [id number]  Chief Complaint  Patient presents with   Follow Up  Pre-Diabetes    HPI  Prediabetes -patient still drinking sugary drinks including sweet tea and [facility].  Eats breads, pasta, and other starches.  Not having any urination or fatigue or other symptoms of hyperglycemia.  Discussed proper diet for diabetes  Cholesterol -patient states that his father had very high lipids as well.  Also several male members of his family have already passed away due to heart disease.  Patient is compliant with his Crestor.  He saw Dr. Adolm Joseph in December and says he sees him once a year.  I advised him I would forward our results to Dr. Rennis Golden for his fasting lipid panel.  Patient has no complaints with his cholesterol medication.  Hypertension-patient is taking his hydrochlorothiazide and losartan.  We discussed other options.  He said he was on amlodipine previously but was taken off it, does not know why.  We discussed increasing his hydrochlorothiazide and combining those 2 medications into 1 pill.  Patient is okay with this.  Skin complaint-patient and wife concerned about "mole" on his back.  We discussed seborrheic keratoses and what they are and how they are different from skin cancer or moles.  Patient reassured.    ROS    Objective:     BP 132/64   Pulse 69   Temp 97.8 F (36.6 C) (Oral)   Ht 6' (1.829 m)   Wt 201 lb (91.2 kg)   SpO2 95%   BMI 27.26 kg/m    Physical Exam General: Alert and oriented CV: Regular rate and rhythm Pulmonary: Lungs clear bilaterally Skin: Scattered small seborrheic keratosis on the back.   Results for orders placed or performed in visit on 02/18/23  POCT HgB A1C  Result Value Ref Range   Hemoglobin A1C 6.2 (A) 4.0 - 5.6 %   HbA1c POC (<> result, manual entry)     HbA1c, POC (prediabetic range)     HbA1c, POC  (controlled diabetic range)        The 10-year ASCVD risk score (Arnett DK, et al., 2019) is: 15.9%    Assessment & Plan:   Problem List Items Addressed This Visit       Cardiovascular and Mediastinum   Essential hypertension (Chronic)    Increasing hydrochlorothiazide dose to 25 and combining it with losartan to Hyzaar.  Advised patient to discontinue his other blood pressure medications.  Recheck BMP in 2 weeks.      Relevant Medications   losartan-hydrochlorothiazide (HYZAAR) 100-25 MG tablet   Other Relevant Orders   Basic Metabolic Panel (BMET)     Other   Dyslipidemia - Primary (Chronic)    Recheck lipids fasting panel today cholesterol managed by Dr. Rennis Golden.  Will forward results to his office.      Relevant Orders   Lipid panel   Prediabetes    Stable, recheck in 6 months.  If still stable can recheck yearly after that.      Relevant Orders   POCT HgB A1C (Completed)    Return in about 6 months (around 08/21/2023) for HTN, DM.    [name], MD

## 2023-02-18 NOTE — Patient Instructions (Signed)
It was nice to meet you today,  I have changed your blood pressure medications: - Instead of taking hydrochlorothiazide and losartan separately, I have combine them into 1 pill with a higher dose of HCTZ - This means that you should stop taking the losartan and the hydrochlorothiazide and replace it with the Hyzaar medication that I just prescribed. - Your A1c is slightly improved.  We would not need to check this again for at least another 6 months. - I will send the results to Dr. Rennis Golden for your cholesterol testing - We will need you to come back for a lab draw in 2 weeks to recheck your kidney function after increasing the dose of your blood pressure medication  Have a great day,  Frederic Jericho, MD

## 2023-02-18 NOTE — Assessment & Plan Note (Signed)
Recheck lipids fasting panel today cholesterol managed by Dr. Rennis Golden.  Will forward results to his office.

## 2023-02-19 LAB — LIPID PANEL
Chol/HDL Ratio: 4.5 ratio (ref 0.0–5.0)
Cholesterol, Total: 153 mg/dL (ref 100–199)
HDL: 34 mg/dL — ABNORMAL LOW (ref >39)
LDL Chol Calc (NIH): 51 mg/dL (ref 0–99)
Triglycerides: 459 mg/dL — ABNORMAL HIGH (ref 0–149)
VLDL Cholesterol Cal: 68 mg/dL — ABNORMAL HIGH (ref 5–40)

## 2023-02-28 ENCOUNTER — Other Ambulatory Visit: Payer: Self-pay

## 2023-02-28 DIAGNOSIS — E781 Pure hyperglyceridemia: Secondary | ICD-10-CM

## 2023-02-28 DIAGNOSIS — E785 Hyperlipidemia, unspecified: Secondary | ICD-10-CM

## 2023-02-28 MED ORDER — ICOSAPENT ETHYL 1 G PO CAPS: 2.0000 g | ORAL_CAPSULE | Freq: Two times a day (BID) | ORAL | 0 refills | Status: DC

## 2023-02-28 NOTE — Telephone Encounter (Signed)
Patient states he is willing to start Icosapent Ethyl per recommendation of Constance Goltz, MD. Script has been sent to [facility].

## 2023-03-04 ENCOUNTER — Telehealth: Payer: Self-pay | Admitting: *Deleted

## 2023-03-04 NOTE — Telephone Encounter (Signed)
Pt calling inquiring about the medications that were filled,  he said that he had not gotten any medication I contacted pharmacy and they received the Rx's and she said that she was going  to let pharmacist take a look at it and see what was going on.  Told pt to follow up with pharmacist.

## 2023-03-07 ENCOUNTER — Other Ambulatory Visit: Payer: Commercial Managed Care - HMO

## 2023-03-07 DIAGNOSIS — I10 Essential (primary) hypertension: Secondary | ICD-10-CM

## 2023-03-08 LAB — BASIC METABOLIC PANEL
BUN/Creatinine Ratio: 21 (ref 10–24)
BUN: 27 mg/dL (ref 8–27)
CO2: 23 mmol/L (ref 20–29)
Calcium: 9.5 mg/dL (ref 8.6–10.2)
Chloride: 96 mmol/L (ref 96–106)
Creatinine, Ser: 1.27 mg/dL (ref 0.76–1.27)
Glucose: 110 mg/dL — ABNORMAL HIGH (ref 70–99)
Potassium: 3.8 mmol/L (ref 3.5–5.2)
Sodium: 137 mmol/L (ref 134–144)
eGFR: 63 mL/min/{1.73_m2} (ref >59)

## 2023-05-10 ENCOUNTER — Ambulatory Visit (INDEPENDENT_AMBULATORY_CARE_PROVIDER_SITE_OTHER): Payer: HMO | Admitting: Family

## 2023-05-10 ENCOUNTER — Encounter (HOSPITAL_BASED_OUTPATIENT_CLINIC_OR_DEPARTMENT_OTHER): Payer: Self-pay | Admitting: Family

## 2023-05-10 VITALS — BP 138/70 | HR 69 | Ht 72.0 in | Wt 201.0 lb

## 2023-05-10 DIAGNOSIS — R600 Localized edema: Secondary | ICD-10-CM | POA: Diagnosis not present

## 2023-05-10 DIAGNOSIS — I2510 Atherosclerotic heart disease of native coronary artery without angina pectoris: Secondary | ICD-10-CM

## 2023-05-10 DIAGNOSIS — E781 Pure hyperglyceridemia: Secondary | ICD-10-CM

## 2023-05-10 DIAGNOSIS — I10 Essential (primary) hypertension: Secondary | ICD-10-CM

## 2023-05-10 DIAGNOSIS — E785 Hyperlipidemia, unspecified: Secondary | ICD-10-CM

## 2023-05-10 DIAGNOSIS — I5032 Chronic diastolic (congestive) heart failure: Secondary | ICD-10-CM | POA: Diagnosis not present

## 2023-05-10 MED ORDER — ICOSAPENT ETHYL 1 G PO CAPS
2.0000 g | ORAL_CAPSULE | Freq: Two times a day (BID) | ORAL | 5 refills | Status: AC
Start: 2023-05-10 — End: ?

## 2023-05-10 MED ORDER — SPIRONOLACTONE 25 MG PO TABS
12.5000 mg | ORAL_TABLET | Freq: Every day | ORAL | 2 refills | Status: DC
Start: 2023-05-10 — End: 2023-08-01

## 2023-05-10 NOTE — Patient Instructions (Addendum)
Medication Instructions:   START Spironolactone 12.5 mg daily  We are sending in Vascepa 1 g to your pharmacy and will work on Prior Authorization.  *If you need a refill on your cardiac medications before your next appointment, please call your pharmacy*   Lab Work: Your physician recommends that you return for lab work 1 week: BMET  If you have labs (blood work) drawn today and your tests are completely normal, you will receive your results only by: MyChart Message (if you have MyChart) OR A paper copy in the mail If you have any lab test that is abnormal or we need to change your treatment, we will call you to review the results.   Testing/Procedures: Your physician has requested that you have an echocardiogram. Echocardiography is a painless test that uses sound waves to create images of your heart. It provides your doctor with information about the size and shape of your heart and how well your heart's chambers and valves are working. This procedure takes approximately one hour. There are no restrictions for this procedure. Please do NOT wear cologne, perfume, aftershave, or lotions (deodorant is allowed). Please arrive 15 minutes prior to your appointment time.  Your physician has requested that you have a lower extremity venous duplex. This test is an ultrasound of the veins in the legs or arms. It looks at venous blood flow that carries blood from the heart to the legs or arms. Allow one hour for a Lower Venous exam. Allow thirty minutes for an Upper Venous exam. There are no restrictions or special instructions.   Follow-Up: At [facility], you and your health needs are our priority.  As part of our continuing mission to provide you with exceptional heart care, we have created designated Provider Care Teams.  These Care Teams include your primary Cardiologist (physician) and Advanced Practice Providers (APPs -  Physician Assistants and Nurse Practitioners) who all work  together to provide you with the care you need, when you need it.  We recommend signing up for the patient portal called "MyChart".  Sign up information is provided on this After Visit Summary.  MyChart is used to connect with patients for Virtual Visits (Telemedicine).  Patients are able to view lab/test results, encounter notes, upcoming appointments, etc.  Non-urgent messages can be sent to your provider as well.   To learn more about what you can do with MyChart, go to [email or web address].    Your next appointment:   6-8 week(s)  Provider:   K. Italy Hilty, MD or Gillian Shields, NP   Other Instructions  To prevent or reduce lower extremity swelling: Eat a low salt diet. Salt makes the body hold onto extra fluid which causes swelling. Sit with legs elevated. For example, in the recliner or on an ottoman.  Wear knee-high compression stockings during the daytime. Ones labeled 15-20 mmHg provide good compression.

## 2023-05-10 NOTE — Progress Notes (Signed)
Cardiology Office Note:  .   Date:  05/13/2023  ID:  James Stokes, DOB 10-18-1955, MRN [id number] PCP: James Jews, NP  [facility] Providers Cardiologist:  James Batty, MD    History of Present Illness: .   James Stokes is a 67 y.o. male with history of hyperlipidemia, hypertriglyceridemia, COPD, diastolic heart failure, prior tobacco use, OSA on CPAP, coronary calcification on CT.  Presents today with family member for RLE swelling. Initial onset 3 yars ago but has been worsening. Knot on the right side of his leg which popped up a couple weeks - no known injury. Notes previously he has had "blue-ing" and his RLE turned purple evaluated by PCP. Some elevation with some improvement in symptoms. Occasional LE edema.  Most recent round of swelling has been ongoing for about a week. Describes some "tightness" and an "ache". Also periodic hand swelling.  Notes some exertional dyspnea. No orthopnea. PND. Wearing 3L of O2 at bedtime. He uses his albuterol rarely, only 4 times over the last 3 years. No longer taking Incruse nor Breo, was told by PCP no longer needed after having a nurse walk in the clinic with him.   Previous diagnosis of OSA. Has never been on CPAP. Wife notes he snoring. Sometimes wakes feeling rested but sometimes tired. Does take occasional midday nap.   BP at home checked intermittently with readings 130-140s.   ROS: Please see the history of present illness.    All other systems reviewed and are negative.   Studies Reviewed: Marland Kitchen   EKG Interpretation Date/Time:  Tuesday May 10 2023 13:50:20 EDT Ventricular Rate:  69 PR Interval:  176 QRS Duration:  114 QT Interval:  406 QTC Calculation: 435 R Axis:   92  Text Interpretation: Normal sinus rhythm with sinus arrhythmia Rightward axis Confirmed by Gillian Shields ([id number]) on 05/10/2023 2:07:11 PM    Cardiac Studies & Procedures       ECHOCARDIOGRAM  ECHOCARDIOGRAM COMPLETE  08/17/2018  Narrative *[place]* *[facility]* [address] [place], [place] [address] [phone]  ------------------------------------------------------------------- Transthoracic Echocardiography  Patient:    James Stokes, James Stokes MR #:       [id number] Study Date: 08/17/2018 Gender:     M Age:        67 Height:     180.3 cm Weight:     86 kg BSA:        2.09 m^2 Pt. Status: Room:       3E30C  ATTENDING    James Batty, MD Annie Sable K REFERRING    Corine Shelter K PERFORMING   [facility], Inpatient SONOGRAPHER  Belva Chimes  cc:  ------------------------------------------------------------------- LV EF: 60% -   65%  ------------------------------------------------------------------- Indications:      Renal insufficiency 593.9.  ------------------------------------------------------------------- Study Conclusions  - Left ventricle: The cavity size was normal. Wall thickness was increased in a pattern of mild LVH. Systolic function was normal. The estimated ejection fraction was in the range of 60% to 65%. Wall motion was normal; there were no regional wall motion abnormalities. Doppler parameters are consistent with abnormal left ventricular relaxation (grade 1 diastolic dysfunction). - Aortic valve: Valve area (VTI): 2.89 cm^2. Valve area (Vmean): 2.63 cm^2. - Left atrium: The atrium was mildly dilated. - Right ventricle: The cavity size was mildly dilated. Systolic function was mildly reduced. - Right atrium: The atrium was mildly to moderately dilated.  ------------------------------------------------------------------- Study data:  No prior study was available for comparison.  Study status:  Routine.  Procedure:  The patient reported no pain pre or post test. Transthoracic echocardiography. Image quality was good. Study completion:  There were no complications. Transthoracic echocardiography.  M-mode, complete 2D,  spectral Doppler, and color Doppler.  Birthdate:  Patient birthdate: 09-11-1956.  Age:  Patient is 67 yr old.  Sex:  Gender: male. BMI: 26.5 kg/m^2.  Patient status:  Inpatient.  Study date:  Study date: 08/17/2018. Study time: 01:31 PM.  Location:  Emergency department.  -------------------------------------------------------------------  ------------------------------------------------------------------- Left ventricle:  The cavity size was normal. Wall thickness was increased in a pattern of mild LVH. Systolic function was normal. The estimated ejection fraction was in the range of 60% to 65%. Wall motion was normal; there were no regional wall motion abnormalities. Doppler parameters are consistent with abnormal left ventricular relaxation (grade 1 diastolic dysfunction).  ------------------------------------------------------------------- Aortic valve:   Mildly calcified leaflets. Cusp separation was normal.  Doppler:  Transvalvular velocity was within the normal range. There was no stenosis. There was no regurgitation.    VTI ratio of LVOT to aortic valve: 0.92. Valve area (VTI): 2.89 cm^2. Indexed valve area (VTI): 1.38 cm^2/m^2. Mean velocity ratio of LVOT to aortic valve: 0.84. Valve area (Vmean): 2.63 cm^2. Indexed valve area (Vmean): 1.26 cm^2/m^2.    Mean gradient (S): 5 mm Hg.  ------------------------------------------------------------------- Aorta:  Aortic root: The aortic root was normal in size. Ascending aorta: The ascending aorta was normal in size.  ------------------------------------------------------------------- Mitral valve:   Structurally normal valve.   Leaflet separation was normal.  Doppler:  Transvalvular velocity was within the normal range. There was no evidence for stenosis. There was trivial regurgitation.    Peak gradient (D): 3 mm Hg.  ------------------------------------------------------------------- Left atrium:  The atrium was mildly  dilated.  ------------------------------------------------------------------- Right ventricle:  The cavity size was mildly dilated. Systolic function was mildly reduced.  ------------------------------------------------------------------- Pulmonic valve:   Poorly visualized.  Doppler:  There was no significant regurgitation.  ------------------------------------------------------------------- Tricuspid valve:   Structurally normal valve.   Leaflet separation was normal.  Doppler:  Transvalvular velocity was within the normal range. There was no regurgitation.  ------------------------------------------------------------------- Right atrium:  The atrium was mildly to moderately dilated.  ------------------------------------------------------------------- Systemic veins: Inferior vena cava: The vessel was dilated. The respirophasic diameter changes were in the normal range (>= 50%), consistent with elevated central venous pressure.  ------------------------------------------------------------------- Measurements  Left ventricle                            Value          Reference LV ID, ED, PLAX chordal           (L)     40.9  mm       43 - 52 LV ID, ES, PLAX chordal                   28    mm       23 - 38 LV fx shortening, PLAX chordal            32    %        >=29 LV PW thickness, ED                       11.5  mm       --------- IVS/LV PW ratio, ED  1              <=1.3 Stroke volume, 2D                         71    ml       --------- Stroke volume/bsa, 2D                     34    ml/m^2   --------- LV ejection fraction, 1-p A4C             58    %        --------- LV end-diastolic volume, 2-p              159   ml       --------- LV end-systolic volume, 2-p               70    ml       --------- LV ejection fraction, 2-p                 56    %        --------- Stroke volume, 2-p                        89    ml       --------- LV end-diastolic  volume/bsa, 2-p          76    ml/m^2   --------- LV end-systolic volume/bsa, 2-p           34    ml/m^2   --------- Stroke volume/bsa, 2-p                    42.5  ml/m^2   --------- LV e&', lateral                            7.51  cm/s     --------- LV E/e&', lateral                          10.65          --------- LV s&', lateral                            11.7  cm/s     ---------  Ventricular septum                        Value          Reference IVS thickness, ED                         11.5  mm       ---------  LVOT                                      Value          Reference LVOT ID, S                                20    mm       ---------  LVOT area                                 3.14  cm^2     --------- LVOT mean velocity, S                     80.8  cm/s     --------- LVOT VTI, S                               22.7  cm       ---------  Aortic valve                              Value          Reference Aortic valve mean velocity, S             96.5  cm/s     --------- Aortic valve VTI, S                       24.7  cm       --------- Aortic mean gradient, S                   5     mm Hg    --------- VTI ratio, LVOT/AV                        0.92           --------- Aortic valve area, VTI                    2.89  cm^2     --------- Aortic valve area/bsa, VTI                1.38  cm^2/m^2 --------- Velocity ratio, mean, LVOT/AV             0.84           --------- Aortic valve area, mean velocity          2.63  cm^2     --------- Aortic valve area/bsa, mean               1.26  cm^2/m^2 --------- velocity  Aorta                                     Value          Reference Aortic root ID, ED                        32    mm       --------- Ascending aorta ID, A-P, S                39    mm       ---------  Left atrium                               Value          Reference LA ID, A-P, ES  31    mm       --------- LA ID/bsa, A-P                             1.49  cm/m^2   <=2.2 LA volume, S                              73.2  ml       --------- LA volume/bsa, S                          35.1  ml/m^2   --------- LA volume, ES, 1-p A4C                    68.9  ml       --------- LA volume/bsa, ES, 1-p A4C                33    ml/m^2   --------- LA volume, ES, 1-p A2C                    69    ml       --------- LA volume/bsa, ES, 1-p A2C                33.1  ml/m^2   ---------  Mitral valve                              Value          Reference Mitral E-wave peak velocity               80    cm/s     --------- Mitral A-wave peak velocity               120   cm/s     --------- Mitral deceleration time          (L)     81    ms       150 - 230 Mitral peak gradient, D                   3     mm Hg    --------- Mitral E/A ratio, peak                    0.7            ---------  Systemic veins                            Value          Reference Estimated CVP                             15    mm Hg    ---------  Right ventricle                           Value          Reference RV ID, minor axis, ED, A4C base           50.9  mm       --------- RV ID, minor axis, ED,  A4C mid            52.3  mm       --------- RV ID, major axis, ED, A4C                87.6  mm       55 - 91 TAPSE                                     17.8  mm       --------- RV s&', lateral, S                         10.1  cm/s     ---------  Pulmonic valve                            Value          Reference Pulmonic valve peak velocity, S           109   cm/s     ---------  Legend: (L)  and  (H)  mark values outside specified reference range.  ------------------------------------------------------------------- Prepared and Electronically Authenticated by  Nicholes Mango, MD 2019-11-21T17:53:34             Risk Assessment/Calculations:          Physical Exam:   VS:  BP 138/70   Pulse 69   Ht 6' (1.829 m)   Wt 201 lb (91.2 kg)   BMI 27.26 kg/m    Wt Readings from Last  3 Encounters:  05/10/23 201 lb (91.2 kg)  02/18/23 201 lb (91.2 kg)  09/01/22 205 lb (93 kg)    GEN: Well nourished, well developed in no acute distress NECK: No JVD; No carotid bruits CARDIAC: RRR, no murmurs, rubs, gallops RESPIRATORY:  Clear to auscultation without rales, wheezing or rhonchi  ABDOMEN: Soft, non-tender, non-distended EXTREMITIES: RLE with 1+ edema; No deformity   ASSESSMENT AND PLAN: .    LE edema - R>L ongoing 3 years worse  over the last week.  Early 1+ edema.  Consider etiology venous insufficiency versus heart failure.  Rx spironolactone 12.5 mg daily for dual BP and swelling benefit.  BMP in 1 week.  Encourage leg elevation, low-sodium diet. Plan for echocardiogram to rule out heart failure Lower extremity duplex to rule out DVT given RLE greater than LLE edema.  No recent airline travel.  Hypertension-BP not at goal less than 130/80.  Continue losartan-HCT 100-25 mg daily.  Add spironolactone 12.5 mg daily.  If BP remains difficult to control consider transition to valsartan-hydrochlorothiazide.  HLD / Hypertriglyceridemia / Coronary calcification - Stable with no anginal symptoms. No indication for ischemic evaluation.  Follows with Dr. Rennis Golden for lipids.  Continue rosuvastatin 20 mg daily.  He has not been taking Vascepa as it was previously cost prohibitive but now on Medicare, we will resubmit prior authorization for Vascepa.  COPD -overdue for follow-up with pulmonology.  No longer on inhalers.  Recommend scheduling overdue follow-up.    Snoring - Notes snoring with only intermittent daytime somnolence. He will consider sleep study. Discuss again at follow up.        Dispo: follow up in 6-8 weeks  Signed, Alver Sorrow, NP

## 2023-05-13 ENCOUNTER — Encounter (HOSPITAL_BASED_OUTPATIENT_CLINIC_OR_DEPARTMENT_OTHER): Payer: Self-pay | Admitting: Family

## 2023-06-01 ENCOUNTER — Ambulatory Visit (INDEPENDENT_AMBULATORY_CARE_PROVIDER_SITE_OTHER): Payer: HMO

## 2023-06-01 DIAGNOSIS — R600 Localized edema: Secondary | ICD-10-CM | POA: Diagnosis not present

## 2023-06-01 DIAGNOSIS — I10 Essential (primary) hypertension: Secondary | ICD-10-CM

## 2023-06-01 DIAGNOSIS — I5032 Chronic diastolic (congestive) heart failure: Secondary | ICD-10-CM

## 2023-06-01 DIAGNOSIS — I2510 Atherosclerotic heart disease of native coronary artery without angina pectoris: Secondary | ICD-10-CM | POA: Diagnosis not present

## 2023-06-02 ENCOUNTER — Telehealth (HOSPITAL_BASED_OUTPATIENT_CLINIC_OR_DEPARTMENT_OTHER): Payer: Self-pay

## 2023-06-02 LAB — ECHOCARDIOGRAM COMPLETE
AR max vel: 2.86 cm2
AV Area VTI: 2.67 cm2
AV Area mean vel: 2.53 cm2
AV Mean grad: 5 mmHg
AV Peak grad: 8.3 mmHg
Ao pk vel: 1.44 m/s
Area-P 1/2: 4.04 cm2
S' Lateral: 3.88 cm

## 2023-06-02 NOTE — Telephone Encounter (Addendum)
Called patient and wife (ok perDPR), wife states that his blood pressure is generally high when he wakes up but then after medications it normalizes back out. She did not have his blood pressure logs handy and he was in the shower. Advised him to call back with pressures or send in [place] message.    ----- Message from Alver Sorrow sent at 06/02/2023  8:16 AM EDT ----- Stable kidney function. Normal electrolytes. Good result!   Can we inquire about recent BP readings since adding Spironolactone?

## 2023-06-02 NOTE — Telephone Encounter (Signed)
Awaiting results of echo yesterday. General edema recommendations below. Continue present medications until echo results obtained. BP looks good, Spironolactone should help with the swelling.  To prevent or reduce lower extremity swelling: Eat a low salt diet. Salt makes the body hold onto extra fluid which causes swelling. Sit with legs elevated. For example, in the recliner or on an ottoman.  Wear knee-high compression stockings during the daytime. Ones labeled 15-20 mmHg provide good compression.  Alver Sorrow, NP

## 2023-06-02 NOTE — Telephone Encounter (Signed)
Returned call to patient wife, provider recommendations give. She verbalizes understanding.

## 2023-06-02 NOTE — Telephone Encounter (Signed)
Patient returned call, transferred from call center.    Patient states his swelling in his right foot and now has it in his left foot. He has not been checking his blood pressure consistently. He states he checked a couple times this morning and his last one 126/70.

## 2023-06-03 ENCOUNTER — Telehealth (HOSPITAL_BASED_OUTPATIENT_CLINIC_OR_DEPARTMENT_OTHER): Payer: Self-pay | Admitting: *Deleted

## 2023-06-03 DIAGNOSIS — I10 Essential (primary) hypertension: Secondary | ICD-10-CM

## 2023-06-03 DIAGNOSIS — I5032 Chronic diastolic (congestive) heart failure: Secondary | ICD-10-CM

## 2023-06-03 DIAGNOSIS — Z5181 Encounter for therapeutic drug level monitoring: Secondary | ICD-10-CM

## 2023-06-03 MED ORDER — FUROSEMIDE 20 MG PO TABS: 20.0000 mg | ORAL_TABLET | Freq: Every day | ORAL | 3 refills | Status: DC

## 2023-06-03 NOTE — Telephone Encounter (Signed)
Spoke with wife, patient read in [place] and agreeable with plan.  Mailed lab orders and sent Rx to pharmacy

## 2023-06-03 NOTE — Addendum Note (Signed)
Addended by: Regis Bill B on: 06/03/2023 11:29 AM   Modules accepted: Orders

## 2023-06-03 NOTE — Telephone Encounter (Signed)
-----   Message from Alver Sorrow sent at 06/02/2023  6:47 PM EDT ----- Echocardiogram with normal LV pumping function. Heart muscle mildly stiff. Right ventricle function mildly reduced. Mildly elevated pressure in the lungs. Evidence of mild right sided heart failure. Likely due to COPD, recommend scheduling overdue follow up with pulmonology. Continue Spironolactone. May add Furosemide 20mg  daily with BMP/BNP in 1-2 weeks. This is a fluid pill that will help to reduce workload on the lungs and heart

## 2023-06-22 DIAGNOSIS — Z5181 Encounter for therapeutic drug level monitoring: Secondary | ICD-10-CM | POA: Diagnosis not present

## 2023-06-22 DIAGNOSIS — I10 Essential (primary) hypertension: Secondary | ICD-10-CM | POA: Diagnosis not present

## 2023-06-22 DIAGNOSIS — I5032 Chronic diastolic (congestive) heart failure: Secondary | ICD-10-CM | POA: Diagnosis not present

## 2023-06-23 LAB — BASIC METABOLIC PANEL
BUN/Creatinine Ratio: 20 (ref 10–24)
BUN: 30 mg/dL — ABNORMAL HIGH (ref 8–27)
CO2: 23 mmol/L (ref 20–29)
Calcium: 9.7 mg/dL (ref 8.6–10.2)
Chloride: 98 mmol/L (ref 96–106)
Creatinine, Ser: 1.47 mg/dL — ABNORMAL HIGH (ref 0.76–1.27)
Glucose: 158 mg/dL — ABNORMAL HIGH (ref 70–99)
Potassium: 4.2 mmol/L (ref 3.5–5.2)
Sodium: 138 mmol/L (ref 134–144)
eGFR: 53 mL/min/{1.73_m2} — ABNORMAL LOW (ref >59)

## 2023-06-23 LAB — BRAIN NATRIURETIC PEPTIDE: BNP: 20.3 pg/mL (ref 0.0–100.0)

## 2023-07-01 ENCOUNTER — Telehealth (HOSPITAL_BASED_OUTPATIENT_CLINIC_OR_DEPARTMENT_OTHER): Payer: Self-pay

## 2023-07-01 DIAGNOSIS — I10 Essential (primary) hypertension: Secondary | ICD-10-CM

## 2023-07-01 MED ORDER — FUROSEMIDE 20 MG PO TABS: 20.0000 mg | ORAL_TABLET | Freq: Every day | ORAL | Status: DC

## 2023-07-01 NOTE — Telephone Encounter (Addendum)
Results called to patient who verbalizes understanding!    ----- Message from Alver Sorrow sent at 06/30/2023  8:33 PM EDT ----- Kidney function decreased from prior. BNP with no volume overload. Reduce Lasix to three times per week on Mondays, Wednesdays, Fridays. Repeat BMP In 1-2 weeks for monitoring.

## 2023-08-01 ENCOUNTER — Other Ambulatory Visit (HOSPITAL_BASED_OUTPATIENT_CLINIC_OR_DEPARTMENT_OTHER): Payer: Self-pay | Admitting: Family

## 2023-08-01 DIAGNOSIS — R600 Localized edema: Secondary | ICD-10-CM

## 2023-08-01 DIAGNOSIS — I5032 Chronic diastolic (congestive) heart failure: Secondary | ICD-10-CM

## 2023-08-01 DIAGNOSIS — I10 Essential (primary) hypertension: Secondary | ICD-10-CM

## 2023-08-20 ENCOUNTER — Other Ambulatory Visit: Payer: Self-pay | Admitting: Family Medicine

## 2023-08-22 ENCOUNTER — Ambulatory Visit (INDEPENDENT_AMBULATORY_CARE_PROVIDER_SITE_OTHER): Payer: HMO | Admitting: Family Medicine

## 2023-08-22 ENCOUNTER — Encounter: Payer: Self-pay | Admitting: Family Medicine

## 2023-08-22 VITALS — BP 106/71 | HR 100 | Ht 72.0 in | Wt 193.4 lb

## 2023-08-22 DIAGNOSIS — R7303 Prediabetes: Secondary | ICD-10-CM

## 2023-08-22 DIAGNOSIS — R053 Chronic cough: Secondary | ICD-10-CM | POA: Diagnosis not present

## 2023-08-22 DIAGNOSIS — M25522 Pain in left elbow: Secondary | ICD-10-CM | POA: Insufficient documentation

## 2023-08-22 DIAGNOSIS — K529 Noninfective gastroenteritis and colitis, unspecified: Secondary | ICD-10-CM | POA: Diagnosis not present

## 2023-08-22 DIAGNOSIS — I10 Essential (primary) hypertension: Secondary | ICD-10-CM | POA: Diagnosis not present

## 2023-08-22 DIAGNOSIS — J449 Chronic obstructive pulmonary disease, unspecified: Secondary | ICD-10-CM

## 2023-08-22 LAB — POCT GLYCOSYLATED HEMOGLOBIN (HGB A1C): HbA1c POC (<> result, manual entry): 6.1 % (ref 4.0–5.6)

## 2023-08-22 MED ORDER — TIOTROPIUM BROMIDE-OLODATEROL 2.5-2.5 MCG/ACT IN AERS: 2.0000 | INHALATION_SPRAY | Freq: Every day | RESPIRATORY_TRACT | 11 refills | Status: DC

## 2023-08-22 NOTE — Assessment & Plan Note (Signed)
Has COPD but has not used inhaler in 3 to 4 years.  Has not followed with pulmonology in the same timeframe.  Will resend an inhaler.  Will get chest x-ray.  At follow-up if inhaler has not resolved the issue, consider reestablishing with pulmonology for further workup.  No significant history of GERD or seasonal allergies as an adult.

## 2023-08-22 NOTE — Progress Notes (Signed)
Established Patient Office Visit  Subjective   Patient ID: James Stokes, male    DOB: 1956-01-09  Age: 67 y.o. MRN: [id number]  Chief Complaint  Patient presents with   Medical Management of Chronic Issues    HPI  Hypertension-patient recently saw his cardiologist and spironolactone was added to his losartan-HCTZ.  Patient has no issues with this.  Compliant.  HLD-patient taking his Crestor.  Not taking Vascepa or any other fish oil.  Recently stopped it when he started having diarrhea (because he was trying to eliminate anything with oils).  Chronic diarrhea-patient has had diarrhea now for over a month.  Is going 2-3 times per day.  Stool is brown except for when he took Pepto-Bismol.  No abdominal pain.  No nausea or vomiting.  No blood.  No recent antibiotic use.  Prediabetes-we discussed the patient's A1c today.  It is stable.  Will recheck in 1 year.  Chronic cough-patient states he has had a cough now for over a month.  Sometimes productive of greenish-yellow sputum.  No fever or sob. Patient has a history of COPD and has not had his inhalers in several years.  Previously took 2 inhalers.  Last saw his pulmonologist in the office in 2020.  Patient denies any significant seasonal allergy or GERD.  Joint pain-patient has joint pain that occurs in the elbows, wrists, knees, ankles.  These alternate.  Currently he is having left elbow pain.  Has some swelling with it.    The 10-year ASCVD risk score (Arnett DK, et al., 2019) is: 11.1%  Health Maintenance Due  Topic Date Due   Medicare Annual Wellness (AWV)  Never done   COVID-19 Vaccine (1) Never done   Hepatitis C Screening  Never done   Zoster Vaccines- Shingrix (1 of 2) Never done   Colonoscopy  Never done   Pneumonia Vaccine 36+ Years old (1 of 1 - PCV) Never done   Lung Cancer Screening  09/15/2023      Objective:     BP 106/71   Pulse 100   Ht 6' (1.829 m)   Wt 193 lb 6.4 oz (87.7 kg)   SpO2 96%   BMI  26.23 kg/m    Physical Exam General: Alert, oriented CV: Regular rate rhythm no murmurs Pulmonary: Clear bilaterally MSK: Mild swelling over the left posterior elbow.  Nonfluctuant.   Results for orders placed or performed in visit on 08/22/23  POCT HgB A1C  Result Value Ref Range   Hemoglobin A1C     HbA1c POC (<> result, manual entry) 6.1 4.0 - 5.6 %   HbA1c, POC (prediabetic range)     HbA1c, POC (controlled diabetic range)          Assessment & Plan:   Left elbow pain Assessment & Plan: His description of the location of his elbow pain is more likely consistent with lateral epicondylitis.  Discussed home exercises the patient can do.  Recommend topical heat as well.  Will get uric acid due to the presence of some swelling on the left elbow.  Does not appear to be bursitis.  Orders: -     Uric acid  Prediabetes Assessment & Plan: Stable, will continue to check yearly.  Orders: -     POCT glycosylated hemoglobin (Hb A1C)  Chronic diarrhea Assessment & Plan: At least 2-3 episodes of watery diarrhea per day for the past month at least.  No antibiotic use prior to that.  No other changes  in medications he is aware of. - GI pathogen panel and fecal calprotectin - DG abdomen, patient complained of constipation prior to that.  Ruling out diarrhea secondary to constipation  Orders: -     Calprotectin, Fecal -     GI Profile, Stool, PCR -     DG Abd 1 View; Future  Chronic cough Assessment & Plan: Has COPD but has not used inhaler in 3 to 4 years.  Has not followed with pulmonology in the same timeframe.  Will resend an inhaler.  Will get chest x-ray.  At follow-up if inhaler has not resolved the issue, consider reestablishing with pulmonology for further workup.  No significant history of GERD or seasonal allergies as an adult.  Orders: -     DG Chest 2 View; Future  Chronic obstructive pulmonary disease, unspecified COPD type (HCC) Assessment & Plan: Not on  chronic O2 anymore.  Not taking inhaler.  Not currently smoking.  Sending in daily COPD inhaler LAMA/LABA   Essential hypertension Assessment & Plan: Spironolactone recently added to his losartan-HCTZ by cardiology.  Patient taking 12.5 spironolactone.  No questions or concerns.  Blood pressure at goal today.  Continue current medications.   Other orders -     AMB referral to cardiac rehabilitation -     Tiotropium Bromide-Olodaterol; Inhale 2 puffs into the lungs daily.  Dispense: 1 each; Refill: 11     Return in about 4 weeks (around 09/19/2023) for chronic cough, diarrhea.    [name], MD

## 2023-08-22 NOTE — Assessment & Plan Note (Signed)
At least 2-3 episodes of watery diarrhea per day for the past month at least.  No antibiotic use prior to that.  No other changes in medications he is aware of. - GI pathogen panel and fecal calprotectin - DG abdomen, patient complained of constipation prior to that.  Ruling out diarrhea secondary to constipation

## 2023-08-22 NOTE — Assessment & Plan Note (Signed)
Stable, will continue to check yearly.

## 2023-08-22 NOTE — Assessment & Plan Note (Signed)
Not on chronic O2 anymore.  Not taking inhaler.  Not currently smoking.  Sending in daily COPD inhaler LAMA/LABA

## 2023-08-22 NOTE — Assessment & Plan Note (Signed)
His description of the location of his elbow pain is more likely consistent with lateral epicondylitis.  Discussed home exercises the patient can do.  Recommend topical heat as well.  Will get uric acid due to the presence of some swelling on the left elbow.  Does not appear to be bursitis.

## 2023-08-22 NOTE — Assessment & Plan Note (Signed)
Spironolactone recently added to his losartan-HCTZ by cardiology.  Patient taking 12.5 spironolactone.  No questions or concerns.  Blood pressure at goal today.  Continue current medications.

## 2023-08-22 NOTE — Patient Instructions (Signed)
It was nice to see you today,  We addressed the following topics today: -I have sent in a prescription for a daily inhaler for you to use. - I have also sent in an order for a chest x-ray.  This is for [facility] imaging on [address].  You do not need a appointment to have this done.  You can go to their office during office hours. - I have ordered some fecal studies.  You will need to collect your sample at home and bring it back to [place]. - I will follow-up with you regarding these results. - I have sent an order for cardiac rehab.  I will let you know if there is any issues with scheduling this.  Somebody should call you to schedule this.   Have a great day,  Frederic Jericho, MD

## 2023-08-23 LAB — URIC ACID: Uric Acid: 12.1 mg/dL — ABNORMAL HIGH (ref 3.8–8.4)

## 2023-08-29 ENCOUNTER — Other Ambulatory Visit: Payer: Self-pay | Admitting: Family Medicine

## 2023-08-29 DIAGNOSIS — I5031 Acute diastolic (congestive) heart failure: Secondary | ICD-10-CM

## 2023-08-31 DIAGNOSIS — K529 Noninfective gastroenteritis and colitis, unspecified: Secondary | ICD-10-CM | POA: Diagnosis not present

## 2023-09-01 ENCOUNTER — Telehealth (HOSPITAL_COMMUNITY): Payer: Self-pay

## 2023-09-01 NOTE — Telephone Encounter (Signed)
Office referral recv'ed, printed and given to RN for review. 

## 2023-09-01 NOTE — Telephone Encounter (Signed)
Attempted to call patient in regards to Pulmonary Rehab - LM on VM °

## 2023-09-02 ENCOUNTER — Encounter (HOSPITAL_COMMUNITY): Payer: Self-pay

## 2023-09-02 ENCOUNTER — Ambulatory Visit
Admission: RE | Admit: 2023-09-02 | Discharge: 2023-09-02 | Disposition: A | Discharge status: Home / Self Care | Payer: HMO | Source: Ambulatory Visit | Attending: Family Medicine | Admitting: Family Medicine

## 2023-09-02 DIAGNOSIS — K8020 Calculus of gallbladder without cholecystitis without obstruction: Secondary | ICD-10-CM | POA: Diagnosis not present

## 2023-09-02 DIAGNOSIS — R053 Chronic cough: Secondary | ICD-10-CM | POA: Diagnosis not present

## 2023-09-02 DIAGNOSIS — K529 Noninfective gastroenteritis and colitis, unspecified: Secondary | ICD-10-CM

## 2023-09-02 DIAGNOSIS — R197 Diarrhea, unspecified: Secondary | ICD-10-CM | POA: Diagnosis not present

## 2023-09-03 LAB — GI PROFILE, STOOL, PCR

## 2023-09-03 LAB — CALPROTECTIN, FECAL: Calprotectin, Fecal: 12 ug/g (ref 0–120)

## 2023-09-05 ENCOUNTER — Encounter: Payer: Self-pay | Admitting: Family Medicine

## 2023-09-05 ENCOUNTER — Other Ambulatory Visit: Payer: Self-pay | Admitting: Family Medicine

## 2023-09-05 MED ORDER — ALLOPURINOL 100 MG PO TABS: 100.0000 mg | ORAL_TABLET | Freq: Every day | ORAL | 6 refills | Status: DC

## 2023-09-06 ENCOUNTER — Encounter (HOSPITAL_BASED_OUTPATIENT_CLINIC_OR_DEPARTMENT_OTHER): Payer: Self-pay | Admitting: Internal Medicine

## 2023-09-06 ENCOUNTER — Ambulatory Visit (HOSPITAL_BASED_OUTPATIENT_CLINIC_OR_DEPARTMENT_OTHER): Payer: HMO | Admitting: Internal Medicine

## 2023-09-06 VITALS — BP 108/70 | HR 99 | Ht 71.0 in | Wt 194.0 lb

## 2023-09-06 DIAGNOSIS — I10 Essential (primary) hypertension: Secondary | ICD-10-CM

## 2023-09-06 DIAGNOSIS — E785 Hyperlipidemia, unspecified: Secondary | ICD-10-CM

## 2023-09-06 DIAGNOSIS — E781 Pure hyperglyceridemia: Secondary | ICD-10-CM | POA: Diagnosis not present

## 2023-09-06 DIAGNOSIS — I5032 Chronic diastolic (congestive) heart failure: Secondary | ICD-10-CM

## 2023-09-06 NOTE — Progress Notes (Addendum)
LIPID CLINIC CONSULT NOTE  Chief Complaint:  Follow-up dyslipidemia  Primary Care Physician: [name], MD  Primary Cardiologist:  Nanetta Batty, MD  HPI:  James Stokes is a 67 y.o. male who is being seen today for the evaluation of dyslipidemia at the request of Carlean Jews, NP. James Stokes is a 67 y.o. male who presents via audio/video conferencing for a telehealth visit today. James Stokes is a pleasant 67 year old male kindly referred by Dr. Gery Pray for evaluation and management of dyslipidemia. There is evidence for longstanding hypertriglyceridemia however he has not previously been on any therapies including a statin, fibrate or other lipid-lowering therapy. He was hospitalized in 2019 for COPD/diastolic heart failure exacerbation. He also has a history of some alcohol use which she is cut back significantly. He also stopped smoking 2 years ago. He has had better control of his blood pressure and is compliant with CPAP. 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 He has no known coronary disease and had a negative Myoview last in 2005 but was shown to have coronary calcification by CT in 2019. Recently his lipid profile showed a sharp rise in triglycerides with total cholesterol 353, triglycerides 1768, HDL 21 and LDL could not be calculated. In fact he has had multiple elevated triglycerides on blood work including 2 years ago at 28, 695 the year before that and 853 the year before that. The only direct LDL that I could find was in 2018 which was 76, suggesting this may be a primary hypertriglyceridemia. He reports a varied diet but does have reasonable amount of saturated fat intake and sweets.  12/09/2020  Mr. James Stokes returns today for follow-up.  Unfortunately he had switched insurances.  This led to him not being able to get his Crestor.  Finally he was able to get it and just started taking  it this past weekend.  This was after his recent lab work was drawn.  That lab work showed total cholesterol 257, HDL 31, LDL 93 and triglycerides 792.  This is a significant improvement from triglycerides at 1768 in the past.  He may be more active or has made some dietary changes since then.  04/23/2021  James Stokes is seen today for follow-up.  He continues to have marked improvement in his lipids.  He is tolerating the rosuvastatin without issues.  Cholesterol has come down significantly with total now 135, HDL 32, LDL 41 and triglycerides 427.  Previously triglycerides were 792 and prior to that 1768.  He has had no episodes of pancreatitis.  He says he is continuing to work on his diet.  08/17/2021  James Stokes returns today for follow-up.  He had recent labs at his primary care doctor's office unfortunately however they did not draw the lipid profile which we had ordered.  Triglycerides had continued to come down significantly, last were 4 and 27.  LDL cholesterol is down to 41.  This is all on rosuvastatin 20 mg daily.  He is also made dietary changes.  We discussed the possibility of adding a fibrate or omega-3 if his triglycerides remain elevated however they need to be reassessed now.  09/01/2022  James Stokes is seen today in follow-up.  He has no new complaints.  He denies chest pain or shortness of breath.  Blood pressure was somewhat up today.  He reports compliance with his medications.  He has  not had recent lipid testing.  He said he would get that from his PCP.  His last labs were in December 2022 which showed a significant triglyceride elevation at 477.  09/06/2023  James Stokes is seen today in follow-up.  He recently saw Gillian Shields, NP for follow-up.  His blood pressure was not at target.  She made some adjustments there and his blood pressure now is better controlled.  He had been restarted on Vascepa which she was off of because of cost issues.  He is subsequently then  developed a diarrhea.  This has been persistent for several weeks.  He stopped the Vascepa but noted no difference and that intends to go back on it.  He was also placed on furosemide daily.  He backed off to 3 times a week and noticed some improvement and feels that that may be the cause of his diarrhea.  PMHx:  Past Medical History:  Diagnosis Date   Acute diastolic CHF (congestive heart failure) (HCC) 08/22/2018   Atherosclerotic plaque on cardiac vessels, seen on CT chest 08/22/2018   Dyslipidemia    ETOH abuse    HTN (hypertension)    Hypertriglyceridemia    Kidney stones    Sleep apnea in adult 08/29/2018   C-pap ordered June 2020- Dr Craige Cotta reviewed the study   Smoker     Past Surgical History:  Procedure Laterality Date   HERNIA REPAIR     pt was 52 month old   HERNIA REPAIR     double hernia pt unsure of exact date, DR. Ballen did surgery   lipoma  05/1989   fatty tumor on back    FAMHx:  Family History  Problem Relation Age of Onset   COPD Mother    Vascular Disease Father 69       aneurysm   Hyperlipidemia Father    Hypertension Father    Cancer Father    Diabetes Paternal Aunt    Diabetes Paternal Uncle     SOCHx:   reports that he quit smoking about 5 years ago. His smoking use included cigarettes. He started smoking about 48 years ago. He has a 86 pack-year smoking history. He has never used smokeless tobacco. He reports current alcohol use of about 6.0 standard drinks of alcohol per week. He reports current drug use. Drug: Marijuana.  ALLERGIES:  Allergies  Allergen Reactions   Other Hives and Itching   Shrimp [Shellfish Allergy] Itching and Rash    ROS: Pertinent items noted in HPI and remainder of comprehensive ROS otherwise negative.  HOME MEDS: Current Outpatient Medications on File Prior to Visit  Medication Sig Dispense Refill   aspirin 81 MG tablet Take 81 mg by mouth every morning.      diltiazem (CARDIZEM CD) 300 MG 24 hr capsule TAKE 1  CAPSULE BY MOUTH DAILY 90 capsule 3   EPINEPHrine (EPIPEN 2-PAK) 0.3 mg/0.3 mL IJ SOAJ injection Inject 0.3 mg into the muscle as needed for anaphylaxis. 1 each 1   furosemide (LASIX) 20 MG tablet Take 1 tablet (20 mg total) by mouth daily. Take one tablet Monday, Wednesday, and Friday!     losartan-hydrochlorothiazide (HYZAAR) 100-25 MG tablet TAKE 1 TABLET BY MOUTH DAILY 90 tablet 1   Multiple Vitamins-Minerals (MULTIVITAMIN ADULT PO) Take 1 tablet by mouth every morning.      rosuvastatin (CRESTOR) 20 MG tablet TAKE 1 TABLET BY MOUTH DAILY 90 tablet 3   spironolactone (ALDACTONE) 25 MG tablet TAKE 1/2 TABLET BY  MOUTH DAILY 15 tablet 2   Tiotropium Bromide-Olodaterol 2.5-2.5 MCG/ACT AERS Inhale 2 puffs into the lungs daily. 1 each 11   allopurinol (ZYLOPRIM) 100 MG tablet Take 1 tablet (100 mg total) by mouth daily. (Patient not taking: Reported on 09/06/2023) 30 tablet 6   icosapent Ethyl (VASCEPA) 1 g capsule Take 2 capsules (2 g total) by mouth 2 (two) times daily. (Patient not taking: Reported on 09/06/2023) 120 capsule 5   No current facility-administered medications on file prior to visit.    LABS/IMAGING: No results found for this or any previous visit (from the past 48 hour(s)). No results found.  LIPID PANEL:    Component Value Date/Time   CHOL 153 02/18/2023 1130   TRIG 459 (H) 02/18/2023 1130   HDL 34 (L) 02/18/2023 1130   CHOLHDL 4.5 02/18/2023 1130   CHOLHDL 6.8 (H) 01/25/2017 0901   VLDL NOT CALC 01/25/2017 0901   LDLCALC 51 02/18/2023 1130   LDLDIRECT 41 08/28/2021 1043   LDLDIRECT 75 01/25/2017 0901    WEIGHTS: Wt Readings from Last 3 Encounters:  09/06/23 194 lb (88 kg)  08/22/23 193 lb 6.4 oz (87.7 kg)  05/10/23 201 lb (91.2 kg)    VITALS: BP 108/70 (BP Location: Left Arm, Patient Position: Sitting, Cuff Size: Normal)   Pulse 99   Ht 5\' 11"  (1.803 m)   Wt 194 lb (88 kg)   SpO2 97%   BMI 27.06 kg/m   EXAM: Deferred  EKG: EKG  Interpretation Date/Time:  Tuesday September 06 2023 10:59:51 EST Ventricular Rate:  89 PR Interval:  160 QRS Duration:  114 QT Interval:  368 QTC Calculation: 447 R Axis:   96  Text Interpretation: Sinus rhythm with marked sinus arrhythmia with occasional Premature ventricular complexes Rightward axis Incomplete right bundle branch block When compared with ECG of 10-May-2023 13:50, Premature ventricular complexes are now Present Confirmed by Zoila Shutter [phone]) on 09/06/2023 11:04:51 AM    ASSESSMENT: Mixed dyslipidemia with primarily high triglycerides and low HDL Coronary artery calcification COPD, severe History of diastolic heart failure  PLAN: 1.   Mr. Grajales was off of Vascepa due to concerns about diarrhea however had restarted it recently.  His lipids in May total cholesterol 153, LDL 51 and triglycerides 459.  He certainly would benefit from that in addition to his statin.  Blood pressure is now very well-controlled.  EKG performed today for noted skipped beats and demonstrates occasional PVCs.  He seems asymptomatic with this.  I would recommend continuing his cholesterol meds going forward. I think he can follow-up with Luther Parody or Dr. Allyson Sabal his primary cardiologist and follow-up with me as needed.  Chrystie Nose, MD, [facility], FACP  [place]  [facility]  Medical Director of the Advanced Lipid Disorders &  Cardiovascular Risk Reduction Clinic Diplomate of the American Board of Clinical Lipidology Attending Cardiologist  Direct Dial: [phone]  Fax: [phone]  Website:  [email or web address] Nicely Blondine Hottel 09/06/2023, 11:04 AM

## 2023-09-06 NOTE — Patient Instructions (Addendum)
Medication Instructions:   Dr. Rennis Golden said OK to stop lasix  *If you need a refill on your cardiac medications before your next appointment, please call your pharmacy*   Follow-Up: At [facility], you and your health needs are our priority.  As part of our continuing mission to provide you with exceptional heart care, we have created designated Provider Care Teams.  These Care Teams include your primary Cardiologist (physician) and Advanced Practice Providers (APPs -  Physician Assistants and Nurse Practitioners) who all work together to provide you with the care you need, when you need it.  We recommend signing up for the patient portal called "MyChart".  Sign up information is provided on this After Visit Summary.  MyChart is used to connect with patients for Virtual Visits (Telemedicine).  Patients are able to view lab/test results, encounter notes, upcoming appointments, etc.  Non-urgent messages can be sent to your provider as well.   To learn more about what you can do with MyChart, go to [email or web address].    Your next appointment:    6 months with Gillian Shields, NP  AS NEEDED with Dr. Rennis Golden

## 2023-09-12 ENCOUNTER — Other Ambulatory Visit: Payer: Self-pay | Admitting: Family Medicine

## 2023-09-12 DIAGNOSIS — R918 Other nonspecific abnormal finding of lung field: Secondary | ICD-10-CM

## 2023-09-29 ENCOUNTER — Ambulatory Visit
Admission: RE | Admit: 2023-09-29 | Discharge: 2023-09-29 | Disposition: A | Discharge status: Home / Self Care | Payer: HMO | Source: Ambulatory Visit | Attending: Family Medicine | Admitting: Family Medicine

## 2023-09-29 DIAGNOSIS — R918 Other nonspecific abnormal finding of lung field: Secondary | ICD-10-CM

## 2023-09-29 MED ORDER — IOPAMIDOL (ISOVUE-300) INJECTION 61%
200.0000 mL | Freq: Once | INTRAVENOUS | Status: AC | PRN
  Administered 2023-09-29: 75 mL via INTRAVENOUS

## 2023-10-10 ENCOUNTER — Other Ambulatory Visit: Payer: Self-pay | Admitting: Internal Medicine

## 2023-10-12 ENCOUNTER — Encounter: Payer: Self-pay | Admitting: Family Medicine

## 2023-10-12 ENCOUNTER — Ambulatory Visit (INDEPENDENT_AMBULATORY_CARE_PROVIDER_SITE_OTHER): Payer: HMO | Admitting: Family Medicine

## 2023-10-12 VITALS — BP 122/74 | HR 67 | Ht 71.0 in | Wt 197.4 lb

## 2023-10-12 DIAGNOSIS — J449 Chronic obstructive pulmonary disease, unspecified: Secondary | ICD-10-CM | POA: Diagnosis not present

## 2023-10-12 DIAGNOSIS — M25522 Pain in left elbow: Secondary | ICD-10-CM

## 2023-10-12 DIAGNOSIS — E785 Hyperlipidemia, unspecified: Secondary | ICD-10-CM

## 2023-10-12 DIAGNOSIS — K760 Fatty (change of) liver, not elsewhere classified: Secondary | ICD-10-CM

## 2023-10-12 DIAGNOSIS — F1010 Alcohol abuse, uncomplicated: Secondary | ICD-10-CM

## 2023-10-12 DIAGNOSIS — I10 Essential (primary) hypertension: Secondary | ICD-10-CM

## 2023-10-12 DIAGNOSIS — K529 Noninfective gastroenteritis and colitis, unspecified: Secondary | ICD-10-CM

## 2023-10-12 DIAGNOSIS — R911 Solitary pulmonary nodule: Secondary | ICD-10-CM

## 2023-10-12 DIAGNOSIS — I5032 Chronic diastolic (congestive) heart failure: Secondary | ICD-10-CM

## 2023-10-12 DIAGNOSIS — I50812 Chronic right heart failure: Secondary | ICD-10-CM

## 2023-10-12 NOTE — Assessment & Plan Note (Signed)
 Left elbow swollen, appears to be bursitis this time.  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  Recommended compression sleeve.  Your advised him he could have it drained if it does not improve conservative management.

## 2023-10-12 NOTE — Assessment & Plan Note (Signed)
 Sees cardiology.  Continue with blood pressure, cholesterol control.  No longer taking Lasix 

## 2023-10-12 NOTE — Assessment & Plan Note (Signed)
 Plans to restart Vascepa  soon.  Taking his Crestor .  Overall triglyceride levels and cholesterol levels much better than prior to starting medication.  Continue management per Dr. Maximo Spar.

## 2023-10-12 NOTE — Progress Notes (Addendum)
 Established Patient Office Visit  Subjective   Patient ID: James Stokes, male    DOB: 01-17-1956  Age: 68 y.o. MRN: [id number]  Chief Complaint  Patient presents with   Medical Management of Chronic Issues    HPI Patient here today for follow-up after he was called regarding his CT scan findings earlier this morning.  CT scan of his lungs showed new pulmonary nodule 7 mm in size.  The sessile bronchial lesion seen in 2019 had slightly increased in size.  Enlarged hilar lymph nodes and hepatic steatosis, aortic atherosclerosis and emphysema were also noted.  We talked about reestablishing with his pulmonologist Dr. Marchelle Gearing who he had not seen since 2020.  Discussed his hepatic steatosis findings.  He has cut back significantly on his alcohol use since his hospitalization in 2019.  Has about "2 mixed drinks a week" currently.  Also is no longer using tobacco since that time.  He is compliant with his medications.  The only medication he is not taking that is listed is spironolactone.  The Vascepa he plans on restarting again in the future.  He is taking the allopurinol.  States that the swelling in his right leg feels much better.  He has swelling in his left elbow after hitting it on something in his shop.  It is tender.  Patient compliant with his Crestor.  We discussed his labs and most recent visit with Dr. Rennis Golden.   The 10-year ASCVD risk score (Arnett DK, et al., 2019) is: 14.1%  Health Maintenance Due  Topic Date Due   Medicare Annual Wellness (AWV)  Never done   COVID-19 Vaccine (1) Never done   Pneumonia Vaccine 68+ Years old (1 of 2 - PCV) Never done   Hepatitis C Screening  Never done   Zoster Vaccines- Shingrix (1 of 2) Never done   Colonoscopy  Never done      Objective:     BP 122/74   Pulse 67   Ht 5\' 11"  (1.803 m)   Wt 197 lb 6.4 oz (89.5 kg)   SpO2 96%   BMI 27.53 kg/m    Physical Exam General: Alert, oriented Pulmonary: No respiratory  distress Psych: Pleasant affect MSK: Swelling of the left elbow.   No results found for any visits on 10/12/23.      Assessment & Plan:   Chronic obstructive pulmonary disease, unspecified COPD type [facility]) Assessment & Plan: Recommended patient reestablish with his pulmonologist, Dr. Marchelle Gearing who he has not seen in 4 years.  Patient continues to abstain from smoking.  Emphysema seen on most recent CT scan.  New pulmonary nodule seen in the lingula on CT scan.  Sessile polyp in the bronchial still present. Pt chronically on suppllemental oxygen at night.  requires 2L via nasal cannula.    Orders: -     Ambulatory referral to Pulmonology  Chronic diastolic heart failure (HCC)  Chronic diarrhea Assessment & Plan: Diarrhea has resolved.  No concerns regarding this at this time.   Essential hypertension Assessment & Plan: Losartan-HCTZ, with spironolactone recently added.  Blood pressure at goal today.  Continue with current medications.   ETOH abuse Assessment & Plan: Now has about 2 drinks a week, down from 2-3 a day in 2019.  Encouraged continued reduction in alcohol use   Left elbow pain Assessment & Plan: Left elbow swollen, appears to be bursitis this time.  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  Recommended compression sleeve.  Your advised him he could have it drained if it does not improve conservative management.  Orders: -     Uric acid; Future  Chronic right-sided heart failure [facility]) Assessment & Plan: Sees cardiology.  Continue with blood pressure, cholesterol control.  No longer taking Lasix   Dyslipidemia Assessment & Plan: Plans to restart Vascepa soon.  Taking his Crestor.  Overall triglyceride levels and cholesterol levels much better than prior to starting medication.  Continue management per Dr. Rennis Golden.   Hepatic steatosis Assessment & Plan: Fatty liver seen on most recent CT scan.  Has a history of  heavy alcohol use in the past.  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  Encouraged patient to continue with limiting his alcohol use and trying to limit saturated fat and excess sugar from his diet.  Continue taking Crestor as well.  Orders: -     Comprehensive metabolic panel; Future -     CBC with Differential/Platelet; Future  Pulmonary nodule Assessment & Plan: New 7 mm nodule seen in the lingula.  6 months follow-up recommended.  The sessile polypoid lesion seen in the distal left main bronchus that is slightly larger than it was in 2019.  Recommended he reestablish care with Dr. Marchelle Gearing regarding this and his COPD.  Orders: -     Ambulatory referral to Pulmonology     Return in about 6 months (around 04/10/2024).    [name], MD

## 2023-10-12 NOTE — Assessment & Plan Note (Addendum)
 Recommended patient reestablish with his pulmonologist, Dr. Marchelle Gearing who he has not seen in 4 years.  Patient continues to abstain from smoking.  Emphysema seen on most recent CT scan.  New pulmonary nodule seen in the lingula on CT scan.  Sessile polyp in the bronchial still present. Pt chronically on suppllemental oxygen at night.  requires 2L via nasal cannula.

## 2023-10-12 NOTE — Assessment & Plan Note (Signed)
 Losartan -HCTZ, with spironolactone  recently added.  Blood pressure at goal today.  Continue with current medications.

## 2023-10-12 NOTE — Assessment & Plan Note (Signed)
 Diarrhea has resolved.  No concerns regarding this at this time.

## 2023-10-12 NOTE — Patient Instructions (Signed)
 It was nice to see you today,  We addressed the following topics today: -I will send in a new referral for the pulmonologist to Dr. Mardell Shade office.  You can also call their office to schedule an appointment with them - I would like you to schedule a lab visit so we can recheck your uric acid levels, CBC and CMP. - Your CT scan showed something called hepatic steatosis which is fatty liver.  To make sure this does not progress, try to limit your alcohol use is much as possible and limit your intake of saturated fats and excess sugars.  Have a great day,  Etha Henle, MD

## 2023-10-12 NOTE — Assessment & Plan Note (Signed)
 Fatty liver seen on most recent CT scan.  Has a history of heavy alcohol use in the past.  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  Encouraged patient to continue with limiting his alcohol use and trying to limit saturated fat and excess sugar from his diet.  Continue taking Crestor  as well.

## 2023-10-12 NOTE — Assessment & Plan Note (Signed)
 New 7 mm nodule seen in the lingula.  6 months follow-up recommended.  The sessile polypoid lesion seen in the distal left main bronchus that is slightly larger than it was in 2019.  Recommended he reestablish care with Dr. Bertrum Brodie regarding this and his COPD.

## 2023-10-12 NOTE — Assessment & Plan Note (Signed)
 Now has about 2 drinks a week, down from 2-3 a day in 2019.  Encouraged continued reduction in alcohol use

## 2023-10-22 ENCOUNTER — Other Ambulatory Visit (HOSPITAL_BASED_OUTPATIENT_CLINIC_OR_DEPARTMENT_OTHER): Payer: Self-pay | Admitting: Family

## 2023-10-22 DIAGNOSIS — R600 Localized edema: Secondary | ICD-10-CM

## 2023-10-22 DIAGNOSIS — I5032 Chronic diastolic (congestive) heart failure: Secondary | ICD-10-CM

## 2023-10-22 DIAGNOSIS — I10 Essential (primary) hypertension: Secondary | ICD-10-CM

## 2023-11-01 ENCOUNTER — Other Ambulatory Visit: Payer: HMO

## 2023-11-17 ENCOUNTER — Encounter (HOSPITAL_BASED_OUTPATIENT_CLINIC_OR_DEPARTMENT_OTHER): Payer: Self-pay

## 2024-01-21 DIAGNOSIS — J449 Chronic obstructive pulmonary disease, unspecified: Secondary | ICD-10-CM | POA: Diagnosis not present

## 2024-01-24 DIAGNOSIS — H04123 Dry eye syndrome of bilateral lacrimal glands: Secondary | ICD-10-CM | POA: Diagnosis not present

## 2024-01-24 DIAGNOSIS — H0288A Meibomian gland dysfunction right eye, upper and lower eyelids: Secondary | ICD-10-CM | POA: Diagnosis not present

## 2024-01-24 DIAGNOSIS — H2513 Age-related nuclear cataract, bilateral: Secondary | ICD-10-CM | POA: Diagnosis not present

## 2024-01-24 DIAGNOSIS — H0288B Meibomian gland dysfunction left eye, upper and lower eyelids: Secondary | ICD-10-CM | POA: Diagnosis not present

## 2024-01-24 DIAGNOSIS — H35033 Hypertensive retinopathy, bilateral: Secondary | ICD-10-CM | POA: Diagnosis not present

## 2024-01-24 DIAGNOSIS — H40013 Open angle with borderline findings, low risk, bilateral: Secondary | ICD-10-CM | POA: Diagnosis not present

## 2024-02-04 ENCOUNTER — Other Ambulatory Visit: Payer: Self-pay | Admitting: Family Medicine

## 2024-02-20 DIAGNOSIS — J449 Chronic obstructive pulmonary disease, unspecified: Secondary | ICD-10-CM | POA: Diagnosis not present

## 2024-03-01 ENCOUNTER — Encounter: Payer: Self-pay | Admitting: Emergency Medicine

## 2024-03-01 ENCOUNTER — Telehealth: Payer: Self-pay | Admitting: Emergency Medicine

## 2024-03-01 ENCOUNTER — Ambulatory Visit: Admission: EM | Admit: 2024-03-01 | Discharge: 2024-03-01 | Disposition: A | Discharge status: Home / Self Care

## 2024-03-01 ENCOUNTER — Ambulatory Visit (INDEPENDENT_AMBULATORY_CARE_PROVIDER_SITE_OTHER)

## 2024-03-01 ENCOUNTER — Ambulatory Visit: Payer: Self-pay

## 2024-03-01 DIAGNOSIS — M25562 Pain in left knee: Secondary | ICD-10-CM | POA: Diagnosis not present

## 2024-03-01 MED ORDER — PREDNISONE 20 MG PO TABS: 40.0000 mg | ORAL_TABLET | Freq: Every day | ORAL | 0 refills | Status: AC

## 2024-03-01 MED ORDER — NAPROXEN 500 MG PO TABS: 500.0000 mg | ORAL_TABLET | Freq: Two times a day (BID) | ORAL | 0 refills | Status: DC

## 2024-03-01 NOTE — Telephone Encounter (Signed)
 Called pt to discuss xray results on behalf of provider. Pt verbally expressed understanding of results. Also made pt aware that he would receive a phone call if his CBC yielded abnormal results, however, those results will also be available on MyChart. Lastly, advised pt to make FU appt with [facility]. Pt verbalized understanding. No further action required.

## 2024-03-01 NOTE — Telephone Encounter (Signed)
 FYI Only or Action Required?: FYI only for provider  Patient was last seen in primary care on 10/12/2023 by Laneta Pintos, MD. Called Nurse Triage reporting Leg Swelling. Symptoms began several days ago. Interventions attempted: Nothing. Symptoms are: gradually worsening.  Triage Disposition: See HCP Within 4 Hours (Or PCP Triage)---Referred to local UC.   Patient/caregiver understands and will follow disposition?: -- Yes         Copied from CRM [id number]. Topic: Clinical - Red Word Triage >> Mar 01, 2024 10:08 AM Kevelyn M wrote: Red Word that prompted transfer to Nurse Triage: On left leg knee and calf are swollen. Knee is swollen 3 times it's size. Started 3 days ago. Reason for Disposition  [1] Thigh or calf pain AND [2] only 1 side AND [3] present > 1 hour  Answer Assessment - Initial Assessment Questions 1. ONSET: "When did the swelling start?" (e.g., minutes, hours, days)     -- Several days ago     2. LOCATION: "What part of the leg is swollen?"  "Are both legs swollen or just one leg?"     --- Left Knee and Left calf     3. SEVERITY: "How bad is the swelling?" (e.g., localized; mild, moderate, severe)   - Localized: Small area of swelling localized to one leg.   - MILD pedal edema: Swelling limited to foot and ankle, pitting edema < 1/4 inch (6 mm) deep, rest and elevation eliminate most or all swelling.   - MODERATE edema: Swelling of lower leg to knee, pitting edema > 1/4 inch (6 mm) deep, rest and elevation only partially reduce swelling.   - SEVERE edema: Swelling extends above knee, facial or hand swelling present.      -----Moderate- Severe    4. REDNESS: "Does the swelling look red or infected?"     ------------Denies    5. PAIN: "Is the swelling painful to touch?" If Yes, ask: "How painful is it?"   (Scale 1-10; mild, moderate or severe)     ---- Difficulty walking  6. FEVER: "Do you have a fever?" If Yes, ask: "What is it, how was it measured, and when  did it start?"      ---------Denies    7. CAUSE: "What do you think is causing the leg swelling?"    ----------- Unsure    8. MEDICAL HISTORY: "Do you have a history of blood clots (e.g., DVT), cancer, heart failure, kidney disease, or liver failure?"     ----------------Denies   9. RECURRENT SYMPTOM: "Have you had leg swelling before?" If Yes, ask: "When was the last time?" "What happened that time?"     ------------- Yes   10. OTHER SYMPTOMS: "Do you have any other symptoms?" (e.g., chest pain, difficulty breathing)       ------------Denies     Addtiional Infor:  Protocols used: Leg Swelling and Edema-A-AH

## 2024-03-01 NOTE — Discharge Instructions (Addendum)
 You were seen today for worsening knee pain that has developed over the past four days. Your knee appears warm, red, and swollen, with signs that may indicate fluid buildup. You have a known history of arthritis and gout, and you are currently taking allopurinol , which was started after a previously elevated uric acid level. There has been no recent injury or trauma. X-ray of your knee showed moderate swelling over the kneecap, which may suggest inflammation of the bursa in front of the knee (prepatellar bursitis), but no fracture or dislocation was seen. Blood tests were ordered to help determine whether this is a gout flare, arthritis flare, or possible joint infection. You were given a knee sleeve to help with compression and instructed to apply ice for 20 minutes at a time, 3 to 4 times a day, using a towel to protect the skin. Naprosyn and prednisone  were prescribed to help reduce pain and inflammation. While on the naprosyn, do not use over-the-counter anti-inflammatories such as aspirin , Motrin, ibuprofen, or Aleve, as this may increase the risk of side effects. If needed, you may take Tylenol  (acetaminophen ) 1000 mg every six hours for additional pain relief. This equals two 500 mg tablets at a time. Be careful not to take more than 4000 mg of Tylenol  in a 24-hour period. Please follow up with an orthopedic specialist for further evaluation. Seek medical care right away if you develop a fever, if the swelling worsens, or if you are unable to bear weight on the knee.

## 2024-03-01 NOTE — ED Provider Notes (Signed)
 [facility]    CSN: [id number] Arrival date & time: 03/01/24  1056      History   Chief Complaint Chief Complaint  Patient presents with   Knee Pain   Leg Pain    HPI James Stokes is a 68 y.o. male.   James Stokes is a 68 y.o. male presents with knee pain and swelling that started 4 days ago. The knee pain started recently, with the patient noting it became worse after a long 4 hour drive. The affected knee is described as warm and red, with the patient experiencing pain in the area around the kneecap. The patient reports feeling fluid buildup in the knee. They have been taking Tylenol  Arthritis for pain relief. He denies any specific injury or trauma to the knee. The patient's medical history includes arthritis and gout, for which they take daily allopurinol  to manage uric acid levels. Their last recorded uric acid level in November was 12.1, prior to starting the medication. The patient was due for follow-up blood work in January but did not complete it.  The following portions of the patient's history were reviewed and updated as appropriate: allergies, current medications, past family history, past medical history, past social history, past surgical history, and problem list.    Past Medical History:  Diagnosis Date   Acute diastolic CHF (congestive heart failure) (HCC) 08/22/2018   Atherosclerotic plaque on cardiac vessels, seen on CT chest 08/22/2018   Dyslipidemia    ETOH abuse    HTN (hypertension)    Hypertriglyceridemia    Kidney stones    Sleep apnea in adult 08/29/2018   C-pap ordered June 2020- Dr Matilde Son reviewed the study   Smoker     Patient Active Problem List   Diagnosis Date Noted   Hepatic steatosis 10/12/2023   Pulmonary nodule 10/12/2023   Left elbow pain 08/22/2023   Chronic diarrhea 08/22/2023   Chronic cough 08/22/2023   Other fatigue 08/27/2022   Former cigarette smoker 08/27/2022   Prediabetes 05/11/2022   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   Impaired fasting glucose 08/11/2021   Body mass index 28.0-28.9, adult 08/11/2021   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 Ballen did surgery   lipoma  05/1989   fatty tumor on back       Home Medications    Prior to Admission medications   Medication Sig Start Date End Date Taking? Authorizing Provider  furosemide  (LASIX ) 20 MG tablet Take 20 mg by mouth daily. 02/17/24  Yes [provider]  naproxen (NAPROSYN) 500 MG tablet Take 1 tablet (500 mg total) by mouth 2 (two) times daily with a meal. 03/01/24  Yes Maryruth Sol, FNP  predniSONE  (DELTASONE ) 20 MG tablet Take 2 tablets (40 mg total) by mouth daily for 5  days. 03/01/24 03/06/24 Yes Maryruth Sol, FNP  allopurinol  (ZYLOPRIM ) 100 MG tablet Take 1 tablet (100 mg total) by mouth daily. Patient not taking: Reported on 10/12/2023 09/05/23   Laneta Pintos, MD  aspirin  81 MG tablet Take 81 mg by mouth every morning.     [provider]  diltiazem  (CARDIZEM  CD) 300 MG 24 hr capsule TAKE 1 CAPSULE BY MOUTH DAILY 10/12/23   Avanell Leigh, MD  EPINEPHrine  (EPIPEN  2-PAK) 0.3 mg/0.3 mL IJ SOAJ injection Inject 0.3 mg into the muscle as needed for anaphylaxis. 02/10/22   Boscia, Heather E, NP  icosapent  Ethyl (VASCEPA ) 1 g capsule Take 2 capsules (2 g total) by  mouth 2 (two) times daily. Patient not taking: Reported on 10/12/2023 05/10/23   Clearnce Curia, NP  losartan -hydrochlorothiazide  (HYZAAR) 100-25 MG tablet TAKE 1 TABLET BY MOUTH DAILY 02/07/24   Olson, Daniel K, MD  Multiple Vitamins-Minerals (MULTIVITAMIN ADULT PO) Take 1 tablet by mouth every morning.     [provider]  rosuvastatin  (CRESTOR ) 20 MG tablet TAKE 1 TABLET BY MOUTH DAILY 10/12/23   Avanell Leigh, MD  spironolactone  (ALDACTONE ) 25 MG tablet TAKE 1/2 TABLET BY MOUTH DAILY 10/24/23   Avanell Leigh, MD  Tiotropium Bromide -Olodaterol 2.5-2.5 MCG/ACT AERS Inhale 2 puffs into the lungs daily. 08/22/23   Laneta Pintos, MD    Family History Family History  Problem Relation Age of Onset   COPD Mother    Vascular Disease Father 51       aneurysm   Hyperlipidemia Father    Hypertension Father    Cancer Father    Diabetes Paternal Aunt    Diabetes Paternal Uncle     Social History Social History   Tobacco Use   Smoking status: Former    Current packs/day: 0.00    Average packs/day: 2.0 packs/day for 43.0 years (86.0 ttl pk-yrs)    Types: Cigarettes    Start date: 08/18/1975    Quit date: 08/17/2018    Years since quitting: 5.5    Passive exposure: Past   Smokeless tobacco: Never  Vaping Use   Vaping status: Never Used  Substance Use Topics   Alcohol use: Yes    Alcohol/week: 6.0 standard drinks of alcohol    Types: 6 Cans of beer per week    Comment: 1-2 beers a few days a week    Drug use: Yes    Types: Marijuana     Allergies   Other and Shrimp [shellfish allergy]   Review of Systems Review of Systems  Constitutional:  Negative for fever.  Musculoskeletal:  Positive for arthralgias and gait problem. Negative for joint swelling.  Neurological:  Negative for weakness and numbness.  All other systems reviewed and are negative.    Physical Exam Triage Vital Signs ED Triage Vitals  Encounter Vitals Group     BP 03/01/24 1154 (!)  152/92     Systolic BP Percentile --      Diastolic BP Percentile --      Pulse Rate 03/01/24 1154 88     Resp 03/01/24 1154 18     Temp 03/01/24 1154 98.3 F (36.8 C)     Temp Source 03/01/24 1154 Oral     SpO2 03/01/24 1154 99 %     Weight 03/01/24 1153 197 lb 5 oz (89.5 kg)     Height --      Head Circumference --      Peak Flow --  Pain Score 03/01/24 1153 4     Pain Loc --      Pain Education --      Exclude from Growth Chart --    No data found.  Updated Vital Signs BP (!) 152/92 (BP Location: Left Arm)   Pulse 88   Temp 98.3 F (36.8 C) (Oral)   Resp 18   Wt 197 lb 5 oz (89.5 kg)   SpO2 99%   BMI 27.52 kg/m   Visual Acuity Right Eye Distance:   Left Eye Distance:   Bilateral Distance:    Right Eye Near:   Left Eye Near:    Bilateral Near:     Physical Exam Vitals reviewed.  Constitutional:      General: He is not in acute distress.    Appearance: Normal appearance. He is not toxic-appearing.  HENT:     Head: Normocephalic.     Mouth/Throat:     Mouth: Mucous membranes are moist.  Eyes:     Conjunctiva/sclera: Conjunctivae normal.  Cardiovascular:     Rate and Rhythm: Normal rate and regular rhythm.     Heart sounds: Normal heart sounds.  Pulmonary:     Effort: Pulmonary effort is normal.     Breath sounds: Normal breath sounds.  Musculoskeletal:     Right knee: No swelling.     Left knee: Swelling (minimal), effusion and erythema present. Decreased range of motion (due to pain). Tenderness present over the medial joint line and patellar tendon. Normal alignment, normal meniscus and normal patellar mobility.     Comments: Knee is warm and red. Pain elicited in front of kneecap. Possible fluid palpated within the medial aspect of the knee. No calf pain.      Skin:    General: Skin is warm and dry.  Neurological:     General: No focal deficit present.     Mental Status: He is alert and oriented to person, place, and time.      UC  Treatments / Results  Labs (all labs ordered are listed, but only abnormal results are displayed) Labs Reviewed  URIC ACID  CBC WITH DIFFERENTIAL/PLATELET  SEDIMENTATION RATE    EKG   Radiology DG Knee Complete 4 Views Left Result Date: 03/01/2024 CLINICAL DATA:  acute knee pain without injury EXAM: LEFT KNEE - COMPLETE 4+ VIEW COMPARISON:  None Available. FINDINGS: No acute fracture or dislocation. There is no evidence of arthropathy or other focal bone abnormality. Peripheral vascular atherosclerosis. Moderate prepatellar soft tissue swelling. IMPRESSION: Moderate prepatellar soft tissue swelling, which may reflect changes of prepatellar bursitis. No acute fracture or dislocation. Electronically Signed   By: Rance Burrows M.D.   On: 03/01/2024 14:50    Procedures Procedures (including critical care time)  Medications Ordered in UC Medications - No data to display  Initial Impression / Assessment and Plan / UC Course  I have reviewed the triage vital signs and the nursing notes.  Pertinent labs & imaging results that were available during my care of the patient were reviewed by me and considered in my medical decision making (see chart for details).    Patient presents with worsening knee pain over the past 4 days . The affected knee is warm, erythematous, and shows signs of possible fluid accumulation. The patient has a known history of arthritis and gout and is currently on allopurinol , which was initiated after a uric acid level of 12.1 in November. No recent trauma or injury is reported. Given the  clinical presentation, the differential includes a gout flare, osteoarthritis exacerbation, or septic arthritis. A knee X-ray has been ordered to evaluate for effusion, soft tissue swelling, or degenerative changes. Laboratory studies, including uric acid level, CBC, and ESR, have been obtained to help differentiate between inflammatory and infectious etiologies. A knee sleeve was  provided for compression, and the patient was instructed to apply ice for 20 minutes at a time, 3 to 4 times daily, with a towel between the ice and skin. Naprosyn and prednisone  prescribed. Patient advised to follow-up with ortho for further evaluation. Patient was advised to monitor for fever or worsening symptoms and will be contacted if XR and/or labs are abnormal; otherwise, he may view results and visit note in MyChart.   Today's evaluation has revealed no signs of a dangerous process. Discussed diagnosis with patient and/or guardian. Patient and/or guardian aware of their diagnosis, possible red flag symptoms to watch out for and need for close follow up. Patient and/or guardian understands verbal and written discharge instructions. Patient and/or guardian comfortable with plan and disposition.  Patient and/or guardian has a clear mental status at this time, good insight into illness (after discussion and teaching) and has clear judgment to make decisions regarding their care  Documentation was completed with the aid of voice recognition software. Transcription may contain typographical errors. Final Clinical Impressions(s) / UC Diagnoses   Final diagnoses:  Acute pain of left knee     Discharge Instructions      You were seen today for worsening knee pain that has developed over the past four days. Your knee appears warm, red, and swollen, with signs that may indicate fluid buildup. You have a known history of arthritis and gout, and you are currently taking allopurinol , which was started after a previously elevated uric acid level. There has been no recent injury or trauma. X-ray of your knee showed moderate swelling over the kneecap, which may suggest inflammation of the bursa in front of the knee (prepatellar bursitis), but no fracture or dislocation was seen. Blood tests were ordered to help determine whether this is a gout flare, arthritis flare, or possible joint infection. You were  given a knee sleeve to help with compression and instructed to apply ice for 20 minutes at a time, 3 to 4 times a day, using a towel to protect the skin. Naprosyn and prednisone  were prescribed to help reduce pain and inflammation. While on the naprosyn, do not use over-the-counter anti-inflammatories such as aspirin , Motrin, ibuprofen, or Aleve, as this may increase the risk of side effects. If needed, you may take Tylenol  (acetaminophen ) 1000 mg every six hours for additional pain relief. This equals two 500 mg tablets at a time. Be careful not to take more than 4000 mg of Tylenol  in a 24-hour period. Please follow up with an orthopedic specialist for further evaluation. Seek medical care right away if you develop a fever, if the swelling worsens, or if you are unable to bear weight on the knee.    ED Prescriptions     Medication Sig Dispense Auth. Provider   naproxen (NAPROSYN) 500 MG tablet Take 1 tablet (500 mg total) by mouth 2 (two) times daily with a meal. 20 tablet Maryruth Sol, FNP   predniSONE  (DELTASONE ) 20 MG tablet Take 2 tablets (40 mg total) by mouth daily for 5 days. 10 tablet Maryruth Sol, FNP      PDMP not reviewed this encounter.   Maryruth Sol, [place] 03/01/24 [phone]

## 2024-03-01 NOTE — ED Triage Notes (Signed)
 Pt presents c/o swelling and pain in left knee and left lower leg. Pt says he can put weight on the leg but cannot bend it. Pt denies injury and/or fall.

## 2024-03-02 LAB — CBC WITH DIFFERENTIAL/PLATELET
Basophils Absolute: 0.1 10*3/uL (ref 0.0–0.2)
Basos: 1 %
EOS (ABSOLUTE): 0.4 10*3/uL (ref 0.0–0.4)
Eos: 4 %
Hematocrit: 49.4 % (ref 37.5–51.0)
Hemoglobin: 17.1 g/dL (ref 13.0–17.7)
Immature Grans (Abs): 0.1 10*3/uL (ref 0.0–0.1)
Immature Granulocytes: 1 %
Lymphocytes Absolute: 2.5 10*3/uL (ref 0.7–3.1)
Lymphs: 24 %
MCH: 32.1 pg (ref 26.6–33.0)
MCHC: 34.6 g/dL (ref 31.5–35.7)
MCV: 93 fL (ref 79–97)
Monocytes Absolute: 0.9 10*3/uL (ref 0.1–0.9)
Monocytes: 9 %
Neutrophils Absolute: 6.4 10*3/uL (ref 1.4–7.0)
Neutrophils: 61 %
Platelets: 194 10*3/uL (ref 150–450)
RBC: 5.32 x10E6/uL (ref 4.14–5.80)
RDW: 13.4 % (ref 11.6–15.4)
WBC: 10.4 10*3/uL (ref 3.4–10.8)

## 2024-03-02 LAB — URIC ACID: Uric Acid: 8.9 mg/dL — ABNORMAL HIGH (ref 3.8–8.4)

## 2024-03-02 LAB — SEDIMENTATION RATE: Sed Rate: 2 mm/h (ref 0–30)

## 2024-03-05 ENCOUNTER — Ambulatory Visit (HOSPITAL_COMMUNITY): Payer: Self-pay

## 2024-03-12 ENCOUNTER — Encounter: Payer: Self-pay | Admitting: Internal Medicine

## 2024-03-12 ENCOUNTER — Ambulatory Visit: Admitting: Internal Medicine

## 2024-03-12 VITALS — BP 126/72 | HR 90 | Ht 70.0 in | Wt 207.4 lb

## 2024-03-12 DIAGNOSIS — Z87891 Personal history of nicotine dependence: Secondary | ICD-10-CM | POA: Diagnosis not present

## 2024-03-12 DIAGNOSIS — J449 Chronic obstructive pulmonary disease, unspecified: Secondary | ICD-10-CM | POA: Diagnosis not present

## 2024-03-12 DIAGNOSIS — R911 Solitary pulmonary nodule: Secondary | ICD-10-CM

## 2024-03-12 NOTE — Progress Notes (Signed)
 OV 10/04/2018  Subjective:  Patient ID: James Stokes, male , DOB: 11-18-1955 , age 68 y.o. , MRN: [id number] , ADDRESS: [address] [place] [address]   10/04/2018 -   Chief Complaint  Patient presents with   Follow-up    Pt states he has been doing well since last OV. Denies any complaints of SOB or CP. States he coughs occ but it is not a major concern. Pt is on 2L almost 24/7.     James Stokes 68 y.o. -presents for follow-up of his COPD diagnosed while in the hospital.  Post hospital follow-up he saw nurse practitioner.  Since then he is on triple inhaler therapy with Incruse and Breo and 24/7 oxygen .  He presents with his wife.  Both of them report that overall he is doing well.  Minimal symptoms.  Inhalers help really helped him.  COPD CAT score is only 5.5.  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  The machines that can make the oxygen  tank explode or the power of that can react the oxygen  cords and disable him significantly in a very rapid amount of time.  Reviewed his immunization record and does need a flu shot and pneumonia vaccine but he has refused.  We walked him to 150 feet x 3 laps in the office: Resting pulse ox was 97%.  Final pulse ox 94%.  Heart rate went from 100/min at rest to 112/min at rest.  He had no complaints.  Walk with a normal pace.  He tells me that he is now able to take off oxygen  while driving or at the restaurant and when he monitors his pulse ox it is in the high 80s occasionally but most of the time it is in the low 90s.  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  It was felt to be secondary and the thought process was that phlebotomies would not help him.  In December 2019 he saw a nurse  practitioner who recommended and he had 1 unit of phlebotomy.  Follow-up hemoglobin is not available but will be done today  New issue memory loss: Wife is reporting since admission occasional intermittent episodes of short-term memory loss and very rare long-term memory loss.  At this point in time the do not want neurology evaluation and want to observe.\  OV 03/12/2024 -new consult because it has been more than 5 years since I last saw him  Subjective:  Patient ID: James Stokes, male , DOB: 12-08-1955 , age 68 y.o. , MRN: [id number] , ADDRESS: [address] [place] [address] PCP Laneta Pintos, MD Patient Care Team: Laneta Pintos, MD as PCP - General (Family Medicine) Avanell Leigh, MD as PCP - Cardiology (Cardiology) Maire Scot, MD as Consulting Physician (Pulmonary Disease)  This Provider for this visit: Treatment Team:  Attending Provider: Maire Scot, MD    03/12/2024 -   Chief Complaint  Patient presents with   Pulmonary Consult    Self referral- seen previously here for respiratory failure, last visit here 2020.  He had CT Chest 09/29/23 and this was sig for pulmonary nodule. He denies any respiratory co's. He is using o2  with sleep only 2lpm- needs to recert for noct o2.      James James Stokes 68 y.o. -he is the son of my former patient Webb Hake Dabbs.  I saw him back in 2019/2020 for COPD in the setting of posthospitalization.  He returns with his wife.  He tells me that since then overall stable.  In the last 1 year no admissions to the hospital no surgeries.  He continues on Stiolto.  He is also using nighttime oxygen .  He says he traveled to [place] Feb 25, 2024 and February 26, 2024 did not use the night oxygen  and he was doing well.  He is wondering if he should return it.  Is not using daytime oxygen .  All this he just weaned off on subjective needs.  Last pulmonary function test Gold stage III COPD in 2019.  He also had a CT  scan of the chest earlier in the year because of ventral viruses in the community and him having a cough.  He himself was not sick beyond the cough.  This showed a 7 mm lung nodule that I personally visualized.  He also has some groundglass opacities in the lingula and left lower base.  Smoking continues to be in remission overall stable health.      SIT STAND TEST - goal 15 times   03/12/2024    O2 used ra   PRobe - finter or forehead fing   Number sit and stand completed - goal 15 15   Time taken to complete 60 ec   Resting Pulse Ox/HR/Dyspnea  95% and 91/min and dyspnea of 1/10    Peak measures 92 % and 104/min and dyspnea of 3/10   Final Pulse Ox/HR 91% and 100/min and dyspnea of 2/10   Desaturated </= 88% no   Desaturated <= 3% points yes   Got Tachycardic >/= 90/min yes   Miscellaneous comments Mild dyspnea      CT Chest data from date: JAN 2025  - personally visualized and independently interpreted : yes - my findings are: AGREE  IMPRESSION: 1. New 7 mm nodule with linear stranding to the pleural surface in the lingular base. Non-contrast chest CT at 6-12 months is recommended. If the nodule is stable at time of repeat CT, then future CT at 18-24 months (from today's scan) is considered optional for low-risk patients, but is recommended for high-risk patients. For lung cancer screening, adhere to Lung-RADS guidelines. This recommendation follows the consensus statement: Guidelines for Management of Incidental Pulmonary Nodules Detected on CT Images: From the Fleischner Society 2017; Radiology 2017; 284:228-243. 2. 9 x 7 mm sessile polypoid solid lesion along the anterior inferior wall of the distal left main bronchus. This was present on both prior studies and is minimally larger than in 2019, not notably changed since 09/04/2022. Consider bronchoscopy for further study. 3. Emphysema and diffuse bronchial thickening. 4. Stable mildly prominent precarinal and right  hilar lymph nodes. 5. Aortic and coronary artery atherosclerosis. 6. Hepatic steatosis.   Emphysema (ICD10-J43.9).     Electronically Signed   By: Denman Fischer M.D.   On: 10/09/2023 01:46    PFT     Latest Ref Rng & Units 08/19/2018    2:34 PM  PFT Results  FVC-Pre L 2.58   FVC-Predicted Pre % 52   Pre FEV1/FVC % % 68   FEV1-Pre L 1.77   FEV1-Predicted Pre % 47   DLCO uncorrected ml/min/mmHg 14.69   DLCO  [facility]% % 43   DLCO corrected ml/min/mmHg 13.06   DLCO COR %Predicted % 38   DLVA Predicted % 65        LAB RESULTS last 96 hours No results found.       has a past medical history of Acute diastolic CHF (congestive heart failure) (HCC) (08/22/2018), Atherosclerotic plaque on cardiac vessels, seen on CT chest (08/22/2018), Dyslipidemia, ETOH abuse, HTN (hypertension), Hypertriglyceridemia, Kidney stones, Sleep apnea in adult (08/29/2018), and Smoker.   reports that he quit smoking about 5 years ago. His smoking use included cigarettes. He started smoking about 48 years ago. He has a 86 pack-year smoking history. He has been exposed to tobacco smoke. He has never used smokeless tobacco.  Past Surgical History:  Procedure Laterality Date   HERNIA REPAIR     pt was 83 month old   HERNIA REPAIR     double hernia pt unsure of exact date, DR. Ballen did surgery   lipoma  05/1989   fatty tumor on back    Allergies  Allergen Reactions   Other Hives and Itching   Shrimp [Shellfish Allergy] Itching and Rash    Immunization History  Administered Date(s) Administered   Tdap 08/12/2022    Family History  Problem Relation Age of Onset   COPD Mother    Vascular Disease Father 61       aneurysm   Hyperlipidemia Father    Hypertension Father    Cancer Father    Diabetes Paternal Aunt    Diabetes Paternal Uncle      Current Outpatient Medications:    allopurinol  (ZYLOPRIM ) 100 MG tablet, Take 1 tablet (100 mg total) by mouth daily., Disp: 30 tablet, Rfl: 6    aspirin  81 MG tablet, Take 81 mg by mouth every morning. , Disp: , Rfl:    diltiazem  (CARDIZEM  CD) 300 MG 24 hr capsule, TAKE 1 CAPSULE BY MOUTH DAILY, Disp: 90 capsule, Rfl: 3   EPINEPHrine  (EPIPEN  2-PAK) 0.3 mg/0.3 mL IJ SOAJ injection, Inject 0.3 mg into the muscle as needed for anaphylaxis., Disp: 1 each, Rfl: 1   losartan -hydrochlorothiazide  (HYZAAR) 100-25 MG tablet, TAKE 1 TABLET BY MOUTH DAILY, Disp: 90 tablet, Rfl: 1   Multiple Vitamins-Minerals (MULTIVITAMIN ADULT PO), Take 1 tablet by mouth every morning. , Disp: , Rfl:    naproxen  (NAPROSYN ) 500 MG tablet, Take 1 tablet (500 mg total) by mouth 2 (two) times daily with a meal., Disp: 20 tablet, Rfl: 0   rosuvastatin  (CRESTOR ) 20 MG tablet, TAKE 1 TABLET BY MOUTH DAILY, Disp: 90 tablet, Rfl: 3   spironolactone  (ALDACTONE ) 25 MG tablet, TAKE 1/2 TABLET BY MOUTH DAILY, Disp: 45 tablet, Rfl: 3   Tiotropium Bromide -Olodaterol 2.5-2.5 MCG/ACT AERS, Inhale 2 puffs into the lungs daily., Disp: 1 each, Rfl: 11   furosemide  (LASIX ) 20 MG tablet, Take 20 mg by mouth daily., Disp: , Rfl:    icosapent  Ethyl (VASCEPA ) 1 g capsule, Take 2 capsules (2 g total) by mouth 2 (two) times daily. (Patient not taking: Reported on 03/12/2024), Disp: 120 capsule, Rfl: 5      Objective:   Vitals:   03/12/24 1044  BP: 126/72  Pulse: 90  SpO2: 94%  Weight: 207 lb 6.4 oz (94.1 kg)  Height: 5' 10 (1.778 m)    Estimated body mass index is 29.76 kg/m as calculated from the following:   Height as of this encounter: 5' 10 (1.778 m).   Weight as of this encounter: 207 lb 6.4  oz (94.1 kg).  @WEIGHTCHANGE @  [employer]   03/12/24 1044  Weight: 207 lb 6.4 oz (94.1 kg)     Physical Exam   General: No distress. Looks well O2 at rest: no Cane present: no Sitting in wheel chair: no Frail: no Obese: no Neuro: Alert and Oriented x 3. GCS 15. Speech normal Psych: Pleasant Resp:  Barrel Chest - no.  Wheeze - no, Crackles - no, No overt respiratory  distress CVS: Normal heart sounds. Murmurs - no Ext: Stigmata of Connective Tissue Disease - no HEENT: Normal upper airway. PEERL +. No post nasal drip        Assessment:       ICD-10-CM   1. COPD, severe (HCC)  J44.9 Pulmonary function test    CT Chest Wo Contrast    2. Stopped smoking with greater than 40 pack year history  Z87.891 Pulmonary function test    CT Chest Wo Contrast    3. Nodule of left lung  R91.1 Pulmonary function test    CT Chest Wo Contrast         Plan:     Patient Instructions  COPD, severe (HCC) -alpha-1 MM  -Clinically stable on Stiolto. - Last pulmonary function test was in 2019 - Smoking in remission - Noted that he wanted to know if you still need to use nighttime oxygen    Plan - Continue Stiolto as scheduled - Repeat pulmonary function test in the next few to several weeks -Test overnight pulse oximetry on room air and if this is normal we can discontinue nighttime oxygen  - Do you not need portable o2 for 1 flight of stair level exertion      Stop working with 86-pack-year history in 2019 7 mm lingula left lung nodule January 2025  Glad smoking s under remission  Plan - Continue to monitor - You do qualify for lung cancer screening - Do CT scan of the chest without contrast in the next few to several weeks    Follow-up 1-2 months video visit with nurse practitioner but after pulmonary function test   FOLLOWUP Return in about 7 weeks (around 04/30/2024) for with any of the APPS, VIDEO VISIT.    SIGNATURE    Dr. Maire Scot, M.D., F.C.C.P,  Pulmonary and Critical Care Medicine Staff Physician, [facility] Director - Interstitial Lung Disease  Program  Pulmonary Fibrosis [facility] [place], [place], [address]  Pager: [phone], If no answer or between  15:00h - 7:00h: call [phone] Telephone: [phone]  11:13 AM 03/12/2024

## 2024-03-12 NOTE — Patient Instructions (Addendum)
 COPD, severe (HCC) -alpha-1 MM  -Clinically stable on Stiolto. - Last pulmonary function test was in 2019 - Smoking in remission - Noted that he wanted to know if you still need to use nighttime oxygen    Plan - Continue Stiolto as scheduled - Repeat pulmonary function test in the next few to several weeks -Test overnight pulse oximetry on room air and if this is normal we can discontinue nighttime oxygen  - Do you not need portable o2 for 1 flight of stair level exertion      Stop working with 86-pack-year history in 2019 7 mm lingula left lung nodule January 2025  Glad smoking s under remission  Plan - Continue to monitor - You do qualify for lung cancer screening - Do CT scan of the chest without contrast in the next few to several weeks    Follow-up 1-2 months video visit with nurse practitioner but after pulmonary function test

## 2024-03-21 ENCOUNTER — Other Ambulatory Visit: Payer: Self-pay | Admitting: Family Medicine

## 2024-03-22 DIAGNOSIS — J449 Chronic obstructive pulmonary disease, unspecified: Secondary | ICD-10-CM | POA: Diagnosis not present

## 2024-04-04 ENCOUNTER — Ambulatory Visit: Admitting: Internal Medicine

## 2024-04-04 DIAGNOSIS — J449 Chronic obstructive pulmonary disease, unspecified: Secondary | ICD-10-CM

## 2024-04-04 DIAGNOSIS — Z87891 Personal history of nicotine dependence: Secondary | ICD-10-CM

## 2024-04-04 DIAGNOSIS — R911 Solitary pulmonary nodule: Secondary | ICD-10-CM

## 2024-04-04 LAB — PULMONARY FUNCTION TEST
DL/VA % pred: 54 %
DL/VA: 2.26 ml/min/mmHg/L
DLCO cor % pred: 46 %
DLCO cor: 12.82 ml/min/mmHg
DLCO unc % pred: 49 %
DLCO unc: 13.64 ml/min/mmHg
FEF 25-75 Post: 1.28 L/s
FEF 25-75 Pre: 1.16 L/s
FEF2575-%Change-Post: 10 %
FEF2575-%Pred-Post: 45 %
FEF2575-%Pred-Pre: 41 %
FEV1-%Change-Post: 2 %
FEV1-%Pred-Post: 64 %
FEV1-%Pred-Pre: 63 %
FEV1-Post: 2.31 L
FEV1-Pre: 2.25 L
FEV1FVC-%Change-Post: -3 %
FEV1FVC-%Pred-Pre: 86 %
FEV6-%Change-Post: 6 %
FEV6-%Pred-Post: 79 %
FEV6-%Pred-Pre: 74 %
FEV6-Post: 3.62 L
FEV6-Pre: 3.4 L
FEV6FVC-%Change-Post: 0 %
FEV6FVC-%Pred-Post: 103 %
FEV6FVC-%Pred-Pre: 103 %
FVC-%Change-Post: 6 %
FVC-%Pred-Post: 77 %
FVC-%Pred-Pre: 72 %
FVC-Post: 3.68 L
FVC-Pre: 3.47 L
Post FEV1/FVC ratio: 63 %
Post FEV6/FVC ratio: 98 %
Pre FEV1/FVC ratio: 65 %
Pre FEV6/FVC Ratio: 98 %
RV % pred: 142 %
RV: 3.42 L
TLC % pred: 98 %
TLC: 7.1 L

## 2024-04-04 NOTE — Progress Notes (Signed)
 Full pft performed today.

## 2024-04-04 NOTE — Patient Instructions (Signed)
 Full pft performed today.

## 2024-04-05 ENCOUNTER — Ambulatory Visit (HOSPITAL_BASED_OUTPATIENT_CLINIC_OR_DEPARTMENT_OTHER)
Admission: RE | Admit: 2024-04-05 | Discharge: 2024-04-05 | Disposition: A | Discharge status: Home / Self Care | Source: Ambulatory Visit | Attending: Internal Medicine | Admitting: Internal Medicine

## 2024-04-05 DIAGNOSIS — R911 Solitary pulmonary nodule: Secondary | ICD-10-CM | POA: Diagnosis not present

## 2024-04-05 DIAGNOSIS — R918 Other nonspecific abnormal finding of lung field: Secondary | ICD-10-CM | POA: Diagnosis not present

## 2024-04-05 DIAGNOSIS — J449 Chronic obstructive pulmonary disease, unspecified: Secondary | ICD-10-CM | POA: Insufficient documentation

## 2024-04-05 DIAGNOSIS — J439 Emphysema, unspecified: Secondary | ICD-10-CM | POA: Diagnosis not present

## 2024-04-05 DIAGNOSIS — Z87891 Personal history of nicotine dependence: Secondary | ICD-10-CM | POA: Diagnosis not present

## 2024-04-10 ENCOUNTER — Encounter: Payer: Self-pay | Admitting: Family Medicine

## 2024-04-10 ENCOUNTER — Ambulatory Visit (INDEPENDENT_AMBULATORY_CARE_PROVIDER_SITE_OTHER): Payer: HMO | Admitting: Family Medicine

## 2024-04-10 VITALS — BP 136/80 | HR 93 | Ht 70.0 in | Wt 208.4 lb

## 2024-04-10 DIAGNOSIS — N1831 Chronic kidney disease, stage 3a: Secondary | ICD-10-CM | POA: Diagnosis not present

## 2024-04-10 DIAGNOSIS — M1A9XX0 Chronic gout, unspecified, without tophus (tophi): Secondary | ICD-10-CM | POA: Insufficient documentation

## 2024-04-10 DIAGNOSIS — I10 Essential (primary) hypertension: Secondary | ICD-10-CM

## 2024-04-10 DIAGNOSIS — G629 Polyneuropathy, unspecified: Secondary | ICD-10-CM | POA: Diagnosis not present

## 2024-04-10 DIAGNOSIS — R252 Cramp and spasm: Secondary | ICD-10-CM | POA: Insufficient documentation

## 2024-04-10 DIAGNOSIS — E785 Hyperlipidemia, unspecified: Secondary | ICD-10-CM | POA: Diagnosis not present

## 2024-04-10 NOTE — Patient Instructions (Addendum)
 It was nice to see you today,  We addressed the following topics today: -I am ordering some lab test.  When I get the results of these we can discuss whether or not we need to change her allopurinol  and your spironolactone . - The over-the-counter supplements I mentioned were magnesium oxide 400 mg daily and ferrous sulfate 325 mg every other day. - I would hold off on starting the iron until I talk to you because  your hemoglobin is actually on the higher side.  Have a great day,  Rolan Slain, MD

## 2024-04-10 NOTE — Assessment & Plan Note (Signed)
 aking losartan -HCTZ and spironolactone  12.5 mg daily. Blood pressure is elevated in the office today. Reports home readings are generally good. - recheck of bp better.  No changes to medication today.

## 2024-04-10 NOTE — Assessment & Plan Note (Signed)
 Reports nocturnal and daytime muscle cramps, primarily in the legs, and neuropathy symptoms in both feet. - Counselled on hydration and monitoring urine color. - Order labs including B12, folate, iron, and magnesium to evaluate for reversible causes. - Discussed trying OTC metatarsal pads or considering custom orthotics from a specialty store for foot symptoms. - Recommended OTC magnesium oxide 400 mg at night for cramps, noting it is effective for many and the main side effect is diarrhea. - Will defer medication adjustments until lab results are available. Provided information on where to purchase supplements if he chooses to start them before results are back.

## 2024-04-10 NOTE — Progress Notes (Unsigned)
 Established Patient Office Visit  Subjective   Patient ID: James Stokes, male    DOB: December 13, 1955  Age: 68 y.o. MRN: [id number]  Chief Complaint  Patient presents with   Medical Management of Chronic Issues    HPI  Subjective - Saw pulmonology in June. Had a CT scan and breathing test last week. Follow-up is on 04/17/2024 with an NP via video conference to discuss results. Reports being told about a spot on the lung from a scan a year ago, with a follow-up scan in January showing a new nodule and a polyp along the bronchial wall. The most recent CT on 04/08/2024 showed no pulmonary nodules, a stable bronchial polyp, stable arterial plaques, and stable fatty liver. - History of gout. Went to urgent care on 03/01/2024 for knee pain. Uric acid level was elevated but improved from 7 months ago (was 12). - Reports muscle cramps, occasionally at night in the back of the legs, which can be relieved by stretching. During the day, they last for a few minutes. Suspects it may be related to dehydration when working in the heat. - Reports neuropathy, mostly in both feet. Describes tingling or a burning sensation. Denies symptoms in hands, but notes hands have hurt for years from overuse.  Medications Taking allopurinol  100 mg daily, baby aspirin  daily, diltiazem  prescribed by cardiology, OTC fish oil (reports 2150 mg, similar to Vascepa ), losartan -hydrochlorothiazide , rosuvastatin , and spironolactone  12.5 mg (half of a 25 mg tablet) daily. Discontinued lasix  due to diarrhea that lasted three months.  PMH, PSH, FH, Social Hx PMH: Gout, prediabetes, hypertension, hyperlipidemia, fatty liver, arterial plaques, bronchial polyp. FH: Father had gout, which was diet-resistant. Mother has had prediabetes since she was a teenager. Social Hx: No new issues reported.  ROS Constitutional: No new issues. CV: Stable arterial plaques noted on imaging. Respiratory: Stable bronchial polyp, resolved pulmonary  nodule. GI: Stable fatty liver. Denies diarrhea currently, but reports it as a side effect from previous lasix  use. MSK: Reports muscle cramps and knee pain (resolved). Neuro: Reports neuropathy in feet. The 10-year ASCVD risk score (Arnett DK, et al., 2019) is: 16.9%  Health Maintenance Due  Topic Date Due   Medicare Annual Wellness (AWV)  Never done   COVID-19 Vaccine (1) Never done   Hepatitis C Screening  Never done   Pneumococcal Vaccine: 50+ Years (1 of 2 - PCV) Never done   Zoster Vaccines- Shingrix (1 of 2) Never done   Colonoscopy  Never done      Objective:     BP 136/80   Pulse 93   Ht 5' 10 (1.778 m)   Wt 208 lb 6.4 oz (94.5 kg)   SpO2 92%   BMI 29.90 kg/m  {Vitals History (Optional):23777}  Physical Exam Gen: alert, oriented Pulm: no resp distress Psych: pleasant affect   No results found for any visits on 04/10/24.      Assessment & Plan:   Muscle cramp Assessment & Plan: Reports nocturnal and daytime muscle cramps, primarily in the legs, and neuropathy symptoms in both feet. - Counselled on hydration and monitoring urine color. - Order labs including B12, folate, iron, and magnesium to evaluate for reversible causes. - Discussed trying OTC metatarsal pads or considering custom orthotics from a specialty store for foot symptoms. - Recommended OTC magnesium oxide 400 mg at night for cramps, noting it is effective for many and the main side effect is diarrhea. - Will defer medication adjustments until lab results are available.  Provided information on where to purchase supplements if he chooses to start them before results are back.  Orders: -     B12 and Folate Panel -     CBC with Differential/Platelet -     TSH -     Iron, TIBC and Ferritin Panel -     Magnesium  Neuropathy -     B12 and Folate Panel -     Hemoglobin A1c -     TSH -     Iron, TIBC and Ferritin Panel  Essential hypertension Assessment & Plan: aking losartan -HCTZ and  spironolactone  12.5 mg daily. Blood pressure is elevated in the office today. Reports home readings are generally good. - recheck of bp better.  No changes to medication today.    Stage 3a chronic kidney disease (HCC) -     Comprehensive metabolic panel with GFR  Dyslipidemia -     Lipid panel  Chronic gout without tophus, unspecified cause, unspecified site Assessment & Plan: History of gout with elevated uric acid despite treatment with allopurinol  100 mg daily. Visited urgent care for knee pain on 03/01/2024. Uric acid level at that time was elevated, though lower than 7 months prior. Has a family history of diet-resistant gout. - Order labs, including kidney function test (last checked one year ago). - Plan to increase allopurinol  dose if kidney function is stable, with a target uric acid level <6. Discussed that goal is based on lab values, not just symptoms.      Return in about 6 months (around 10/11/2024) for hld, HTN.    Toribio MARLA Slain, MD

## 2024-04-10 NOTE — Assessment & Plan Note (Signed)
 History of gout with elevated uric acid despite treatment with allopurinol  100 mg daily. Visited urgent care for knee pain on 03/01/2024. Uric acid level at that time was elevated, though lower than 7 months prior. Has a family history of diet-resistant gout. - Order labs, including kidney function test (last checked one year ago). - Plan to increase allopurinol  dose if kidney function is stable, with a target uric acid level <6. Discussed that goal is based on lab values, not just symptoms.

## 2024-04-11 LAB — CBC WITH DIFFERENTIAL/PLATELET
Basophils Absolute: 0.1 x10E3/uL (ref 0.0–0.2)
Basos: 1 %
EOS (ABSOLUTE): 0.3 x10E3/uL (ref 0.0–0.4)
Eos: 5 %
Hematocrit: 44.7 % (ref 37.5–51.0)
Hemoglobin: 15.3 g/dL (ref 13.0–17.7)
Immature Grans (Abs): 0.1 x10E3/uL (ref 0.0–0.1)
Immature Granulocytes: 1 %
Lymphocytes Absolute: 2.1 x10E3/uL (ref 0.7–3.1)
Lymphs: 29 %
MCH: 31.7 pg (ref 26.6–33.0)
MCHC: 34.2 g/dL (ref 31.5–35.7)
MCV: 93 fL (ref 79–97)
Monocytes Absolute: 0.7 x10E3/uL (ref 0.1–0.9)
Monocytes: 9 %
Neutrophils Absolute: 4.1 x10E3/uL (ref 1.4–7.0)
Neutrophils: 55 %
Platelets: 210 x10E3/uL (ref 150–450)
RBC: 4.83 x10E6/uL (ref 4.14–5.80)
RDW: 13.2 % (ref 11.6–15.4)
WBC: 7.4 x10E3/uL (ref 3.4–10.8)

## 2024-04-11 LAB — COMPREHENSIVE METABOLIC PANEL WITH GFR
ALT: 88 IU/L — ABNORMAL HIGH (ref 0–44)
AST: 51 IU/L — ABNORMAL HIGH (ref 0–40)
Albumin: 4.5 g/dL (ref 3.9–4.9)
Alkaline Phosphatase: 102 IU/L (ref 44–121)
BUN/Creatinine Ratio: 20 (ref 10–24)
BUN: 30 mg/dL — ABNORMAL HIGH (ref 8–27)
Bilirubin Total: 0.5 mg/dL (ref 0.0–1.2)
CO2: 22 mmol/L (ref 20–29)
Calcium: 9.7 mg/dL (ref 8.6–10.2)
Chloride: 98 mmol/L (ref 96–106)
Creatinine, Ser: 1.49 mg/dL — ABNORMAL HIGH (ref 0.76–1.27)
Globulin, Total: 2.3 g/dL (ref 1.5–4.5)
Glucose: 192 mg/dL — ABNORMAL HIGH (ref 70–99)
Potassium: 4 mmol/L (ref 3.5–5.2)
Sodium: 137 mmol/L (ref 134–144)
Total Protein: 6.8 g/dL (ref 6.0–8.5)
eGFR: 52 mL/min/1.73 — ABNORMAL LOW (ref >59)

## 2024-04-11 LAB — IRON,TIBC AND FERRITIN PANEL
Ferritin: 238 ng/mL (ref 30–400)
Iron Saturation: 33 % (ref 15–55)
Iron: 107 ug/dL (ref 38–169)
Total Iron Binding Capacity: 323 ug/dL (ref 250–450)
UIBC: 216 ug/dL (ref 111–343)

## 2024-04-11 LAB — MAGNESIUM: Magnesium: 1.9 mg/dL (ref 1.6–2.3)

## 2024-04-11 LAB — HEMOGLOBIN A1C
Est. average glucose Bld gHb Est-mCnc: 163 mg/dL
Hgb A1c MFr Bld: 7.3 % — ABNORMAL HIGH (ref 4.8–5.6)

## 2024-04-11 LAB — LIPID PANEL
Chol/HDL Ratio: 6.5 ratio — ABNORMAL HIGH (ref 0.0–5.0)
Cholesterol, Total: 201 mg/dL — ABNORMAL HIGH (ref 100–199)
HDL: 31 mg/dL — ABNORMAL LOW (ref >39)
Triglycerides: 936 mg/dL (ref 0–149)

## 2024-04-11 LAB — B12 AND FOLATE PANEL
Folate: >20 ng/mL (ref >3.0)
Vitamin B-12: 997 pg/mL (ref 232–1245)

## 2024-04-11 LAB — TSH: TSH: 1.99 u[IU]/mL (ref 0.450–4.500)

## 2024-04-12 ENCOUNTER — Ambulatory Visit: Payer: Self-pay | Admitting: Family Medicine

## 2024-04-12 NOTE — Telephone Encounter (Signed)
 Tried calling No answer and VM is full.

## 2024-04-17 ENCOUNTER — Encounter: Payer: Self-pay | Admitting: Nurse Practitioner

## 2024-04-17 ENCOUNTER — Telehealth (INDEPENDENT_AMBULATORY_CARE_PROVIDER_SITE_OTHER): Admitting: Nurse Practitioner

## 2024-04-17 DIAGNOSIS — G4734 Idiopathic sleep related nonobstructive alveolar hypoventilation: Secondary | ICD-10-CM

## 2024-04-17 DIAGNOSIS — R911 Solitary pulmonary nodule: Secondary | ICD-10-CM

## 2024-04-17 DIAGNOSIS — Z87891 Personal history of nicotine dependence: Secondary | ICD-10-CM | POA: Diagnosis not present

## 2024-04-17 DIAGNOSIS — J449 Chronic obstructive pulmonary disease, unspecified: Secondary | ICD-10-CM | POA: Diagnosis not present

## 2024-04-17 NOTE — Progress Notes (Signed)
 Patient ID: James Stokes, male     DOB: 1956/05/14, 68 y.o.      MRN: [id number]  Chief Complaint  Patient presents with   Follow-up    Breathing is good! No SOB, wheezing.Occasional cough.    Virtual Visit via Video Note  I connected with James Stokes on 04/17/24 at  9:30 AM EDT by a video enabled telemedicine application and verified that I am speaking with the correct person using two identifiers.  Location: Patient: Home Provider: Office   I discussed the limitations of evaluation and management by telemedicine and the availability of in person appointments. The patient expressed understanding and agreed to proceed.  History of Present Illness: 68 year old male, former smoker followed for COPD and nocturnal hypoxia. He is a patient of Dr. Reeves and last seen in office 03/12/2024. Past medical history significant for HF, HTN, prediabetes, polycythemia, hx of ETOH abuse, HLD, gout.   TESTS/EVENTS: 04/04/2024 PFT: FVC 72, FEV1 63, ratio 63, TLC 98, DLCOcor 46 04/05/2024 CT chest: stable small epicardial cysts noted on right. Atherosclerosis, CAD, stable. Stable 13 mm precarinal node. Stable underlying emphysematous change. Lingular lesion resolved. Small lesion in left bronchus, 5 mm, stable. Diffuse infiltration of the liver.   03/12/2024: OV with Dr. Reeves. Last seen 2019/2020 for hospital f/u. Stable over the last year. No hospitalizations. Continues on Stiolto. Uses nighttime oxygen . Traveled recently and did not wear O2; feels he did well. Wondering if he should return it. Not using daytime O2. PFT stage III COPD in 2019. CT earlier this year showed a 7 mm nodule. Some groundglass in lingula and LLL. Continues Stiolto. Repeat PFT. CT chest ordered. Recommended ONO on room air; order not placed.   04/17/2024: Today - follow up Discussed the use of AI scribe software for clinical note transcription with the patient, who gave verbal consent to proceed.  History  of Present Illness James Stokes is a 68 year old male with COPD who presents for follow-up of his lung function and CT scan results. He is accompanied by his wife via virtual visit.   His breathing is stable, and he can walk across the floor without needing to rest. He experiences occasional coughing. No major issues. No wheezing, hemoptysis, or weight loss. He quit smoking in 2019 and has a history of COPD, diagnosed as moderate (stage 2). Repeat PFT showed improvement in lung function compared to 5 years ago.   He is currently using Stiolto, taking two puffs every morning, and uses his albuterol  inhaler only as needed for significant shortness of breath. He is on oxygen  at night. No concerns or complaints today.   Never did ONO.     Allergies  Allergen Reactions   Other Hives and Itching   Shrimp [Shellfish Allergy] Itching and Rash   Immunization History  Administered Date(s) Administered   Tdap 08/12/2022   Past Medical History:  Diagnosis Date   Acute diastolic CHF (congestive heart failure) (HCC) 08/22/2018   Atherosclerotic plaque on cardiac vessels, seen on CT chest 08/22/2018   Dyslipidemia    ETOH abuse    HTN (hypertension)    Hypertriglyceridemia    Kidney stones    Sleep apnea in adult 08/29/2018   C-pap ordered June 2020- Dr Shellia reviewed the study   Smoker     Tobacco History: Social History   Tobacco Use  Smoking Status Former   Current packs/day: 0.00   Average packs/day: 2.0 packs/day for  43.0 years (86.0 ttl pk-yrs)   Types: Cigarettes   Start date: 08/18/1975   Quit date: 08/17/2018   Years since quitting: 5.6   Passive exposure: Past  Smokeless Tobacco Never   Counseling given: Not Answered   Outpatient Medications Prior to Visit  Medication Sig Dispense Refill   allopurinol  (ZYLOPRIM ) 100 MG tablet TAKE 1 TABLET BY MOUTH DAILY 30 tablet 6   aspirin  81 MG tablet Take 81 mg by mouth every morning.      diltiazem  (CARDIZEM  CD) 300 MG 24  hr capsule TAKE 1 CAPSULE BY MOUTH DAILY 90 capsule 3   EPINEPHrine  (EPIPEN  2-PAK) 0.3 mg/0.3 mL IJ SOAJ injection Inject 0.3 mg into the muscle as needed for anaphylaxis. 1 each 1   icosapent  Ethyl (VASCEPA ) 1 g capsule Take 2 capsules (2 g total) by mouth 2 (two) times daily. (Patient not taking: Reported on 04/17/2024) 120 capsule 5   losartan -hydrochlorothiazide  (HYZAAR) 100-25 MG tablet TAKE 1 TABLET BY MOUTH DAILY 90 tablet 1   Multiple Vitamins-Minerals (MULTIVITAMIN ADULT PO) Take 1 tablet by mouth every morning.      rosuvastatin  (CRESTOR ) 20 MG tablet TAKE 1 TABLET BY MOUTH DAILY 90 tablet 3   spironolactone  (ALDACTONE ) 25 MG tablet TAKE 1/2 TABLET BY MOUTH DAILY 45 tablet 3   Tiotropium Bromide -Olodaterol 2.5-2.5 MCG/ACT AERS Inhale 2 puffs into the lungs daily. 1 each 11   No facility-administered medications prior to visit.     Review of Systems:   Constitutional: No weight loss or gain, night sweats, fevers, chills, or lassitude. +fatigue  HEENT: No headaches, nasal congestion, postnasal drainage  CV:  No chest pain, orthopnea, PND, swelling in lower extremities, anasarca, dizziness, palpitations, syncope Resp: +baseline shortness of breath with exertion; stable minimal cough. No excess mucus or change in color of mucus. No hemoptysis. No wheezing.  No chest wall deformity GI:  No heartburn, indigestion, loss of appetite GU: No dysuria, change in color of urine, urgency or frequency.  No flank pain, no hematuria  Neuro: No memory impairment  Psych: No depression or anxiety. Mood stable.   Observations/Objective: Patient is well-developed, well-nourished in no acute distress. Resting comfortably at home.  No labored breathing.  Speech is clear and coherent with logical content.  Patient is alert and oriented at baseline.   Assessment and Plan: COPD (chronic obstructive pulmonary disease) (HCC) Moderate COPD, stable on current regimen. No recent exacerbations or  hospitalizations. Continue stiolto and PRN albuterol . Graded exercises encouraged. Action plan in place. Trigger prevention reviewed. Remain UTD on vaccines.  Patient Instructions  Continue Stiolto 2 puffs daily Continue Albuterol  inhaler 2 puffs every 6 hours as needed for shortness of breath or wheezing. Notify if symptoms persist despite rescue inhaler/neb use.   Referral to lung cancer screening program - due next July 2026  Overnight oxygen  study - do not wear your oxygen  this night Otherwise, continue to wear oxygen  2 lpm at night until we get your results back. Goal>88-90%  Follow up in 6 months with Dr. Geronimo or Izetta Malachy PIETY. If symptoms do not improve or worsen, please contact office for sooner follow up or seek emergency care.    Pulmonary nodule Stable on recent CT imaging. Will refer to lung cancer screening program. Due July 2026  Nocturnal hypoxia Order placed for ONO on room air to determine ongoing need. No exertional hypoxia. Goal >88-90%   I discussed the assessment and treatment plan with the patient. The patient was provided an opportunity to ask  questions and all were answered. The patient agreed with the plan and demonstrated an understanding of the instructions.   The patient was advised to call back or seek an in-person evaluation if the symptoms worsen or if the condition fails to improve as anticipated.  I provided 32 minutes of non-face-to-face time during this encounter.   Comer LULLA Rouleau, NP

## 2024-04-17 NOTE — Telephone Encounter (Signed)
 Pt denied both medications. Pt states that he ate a honey bun the morning of and thinks that's the cause of the high A1C. Pt reports that its been borderline high for years and he will pay more attention to eating the sweets. Pt also reports that he isnt that concerned the high triglycerides bc he already has cardiovascular disease and just wants to wait the 3 months to see where labs are at that time before doing any kind of treatment.

## 2024-04-17 NOTE — Assessment & Plan Note (Signed)
 Order placed for ONO on room air to determine ongoing need. No exertional hypoxia. Goal >88-90%

## 2024-04-17 NOTE — Patient Instructions (Addendum)
 Continue Stiolto 2 puffs daily Continue Albuterol  inhaler 2 puffs every 6 hours as needed for shortness of breath or wheezing. Notify if symptoms persist despite rescue inhaler/neb use.   Referral to lung cancer screening program - due next July 2026  Overnight oxygen  study - do not wear your oxygen  this night Otherwise, continue to wear oxygen  2 lpm at night until we get your results back. Goal>88-90%  Follow up in 6 months with Dr. Geronimo or Izetta Malachy PIETY. If symptoms do not improve or worsen, please contact office for sooner follow up or seek emergency care.

## 2024-04-17 NOTE — Assessment & Plan Note (Addendum)
 Stable on recent CT imaging. Will refer to lung cancer screening program. Due July 2026

## 2024-04-17 NOTE — Assessment & Plan Note (Signed)
 Moderate COPD, stable on current regimen. No recent exacerbations or hospitalizations. Continue stiolto and PRN albuterol . Graded exercises encouraged. Action plan in place. Trigger prevention reviewed. Remain UTD on vaccines.  Patient Instructions  Continue Stiolto 2 puffs daily Continue Albuterol  inhaler 2 puffs every 6 hours as needed for shortness of breath or wheezing. Notify if symptoms persist despite rescue inhaler/neb use.   Referral to lung cancer screening program - due next July 2026  Overnight oxygen  study - do not wear your oxygen  this night Otherwise, continue to wear oxygen  2 lpm at night until we get your results back. Goal>88-90%  Follow up in 6 months with Dr. Geronimo or Izetta Malachy PIETY. If symptoms do not improve or worsen, please contact office for sooner follow up or seek emergency care.

## 2024-04-21 DIAGNOSIS — J449 Chronic obstructive pulmonary disease, unspecified: Secondary | ICD-10-CM | POA: Diagnosis not present

## 2024-05-22 DIAGNOSIS — J449 Chronic obstructive pulmonary disease, unspecified: Secondary | ICD-10-CM | POA: Diagnosis not present

## 2024-05-23 ENCOUNTER — Other Ambulatory Visit (HOSPITAL_BASED_OUTPATIENT_CLINIC_OR_DEPARTMENT_OTHER): Payer: Self-pay | Admitting: Family

## 2024-05-30 ENCOUNTER — Telehealth: Payer: Self-pay

## 2024-05-30 NOTE — Telephone Encounter (Signed)
 Copied from CRM [id number]. Topic: Clinical - Request for Lab/Test Order >> May 29, 2024 11:52 AM James Stokes wrote: Reason for CRM: Patient is calling to inquire about why he has not gotten a sleep test yet. States was supposed to be there about a month ago but it has not shown up. Please update patient when able.   Routing to [facility] to advise, order was placed 7/22.

## 2024-05-30 NOTE — Telephone Encounter (Signed)
 I called and spoke with James Stokes with [employer] and she couldn't see where they had gotten the order. I told her she was the one that confirmed getting the ONO order on 04/17/24. James Stokes stated they had been having trouble getting the ONOs done. She pulled the ONO order again and has given it to the person to get the ONO  delivered tomorrow. I called James Stokes to let him know what James Stokes told me. The ONO device should be delivered tomorrow

## 2024-06-22 ENCOUNTER — Ambulatory Visit: Payer: Self-pay | Admitting: Nurse Practitioner

## 2024-06-22 DIAGNOSIS — J449 Chronic obstructive pulmonary disease, unspecified: Secondary | ICD-10-CM | POA: Diagnosis not present

## 2024-06-22 DIAGNOSIS — G4734 Idiopathic sleep related nonobstructive alveolar hypoventilation: Secondary | ICD-10-CM

## 2024-06-22 NOTE — Progress Notes (Signed)
 ONO on room air with 5 hr and 29 min spent </88%. SpO2 low 78% with average 86%. Needs to continue oxygen  at 2 lpm at night. Please send renewal order to DME and inform pt. Thanks.

## 2024-07-02 ENCOUNTER — Other Ambulatory Visit: Payer: Self-pay

## 2024-07-02 ENCOUNTER — Telehealth (INDEPENDENT_AMBULATORY_CARE_PROVIDER_SITE_OTHER): Admitting: Nurse Practitioner

## 2024-07-02 ENCOUNTER — Encounter: Payer: Self-pay | Admitting: Nurse Practitioner

## 2024-07-02 ENCOUNTER — Telehealth: Payer: Self-pay | Admitting: Family Medicine

## 2024-07-02 DIAGNOSIS — Z87891 Personal history of nicotine dependence: Secondary | ICD-10-CM

## 2024-07-02 DIAGNOSIS — R0902 Hypoxemia: Secondary | ICD-10-CM | POA: Diagnosis not present

## 2024-07-02 DIAGNOSIS — J449 Chronic obstructive pulmonary disease, unspecified: Secondary | ICD-10-CM | POA: Diagnosis not present

## 2024-07-02 DIAGNOSIS — G4734 Idiopathic sleep related nonobstructive alveolar hypoventilation: Secondary | ICD-10-CM

## 2024-07-02 MED ORDER — ALBUTEROL SULFATE HFA 108 (90 BASE) MCG/ACT IN AERS: 2.0000 | INHALATION_SPRAY | Freq: Four times a day (QID) | RESPIRATORY_TRACT | 3 refills | Status: AC | PRN

## 2024-07-02 MED ORDER — ALBUTEROL SULFATE (2.5 MG/3ML) 0.083% IN NEBU: 2.5000 mg | INHALATION_SOLUTION | Freq: Four times a day (QID) | RESPIRATORY_TRACT | 1 refills | Status: AC | PRN

## 2024-07-02 NOTE — Telephone Encounter (Signed)
 Rx sent.

## 2024-07-02 NOTE — Progress Notes (Addendum)
 Patient ID: James Stokes, male     DOB: 1956/02/17, 68 y.o.      MRN: [id number]  Chief Complaint  Patient presents with   Medical Management of Chronic Issues    2l oxygen  at night. Patient uses POC with activity.     Virtual Visit via Video Note  I connected with James Stokes on 07/03/24 at  3:30 PM EDT by a video enabled telemedicine application and verified that I am speaking with the correct person using two identifiers.  Location: Patient: Home Provider: Office   I discussed the limitations of evaluation and management by telemedicine and the availability of in person appointments. The patient expressed understanding and agreed to proceed.  History of Present Illness: 68 year old male, former smoker followed for COPD and nocturnal hypoxia. He is a patient of Dr. Reeves and last seen 04/17/2024. Past medical history significant for HF, HTN, prediabetes, polycythemia, hx of ETOH abuse, HLD, gout.   TESTS/EVENTS: 04/04/2024 PFT: FVC 72, FEV1 63, ratio 63, TLC 98, DLCOcor 46 04/05/2024 CT chest: stable small epicardial cysts noted on right. Atherosclerosis, CAD, stable. Stable 13 mm precarinal node. Stable underlying emphysematous change. Lingular lesion resolved. Small lesion in left bronchus, 5 mm, stable. Diffuse infiltration of the liver.  05/2024 ONO room air: 5 hr 29 min </88%, SpO2 low 78% with average 86%  03/12/2024: OV with Dr. Reeves. Last seen 2019/2020 for hospital f/u. Stable over the last year. No hospitalizations. Continues on Stiolto. Uses nighttime oxygen . Traveled recently and did not wear O2; feels he did well. Wondering if he should return it. Not using daytime O2. PFT stage III COPD in 2019. CT earlier this year showed a 7 mm nodule. Some groundglass in lingula and LLL. Continues Stiolto. Repeat PFT. CT chest ordered. Recommended ONO on room air; order not placed.   04/17/2024: James Stokes is a 68 year old male with COPD  who presents for follow-up of his lung function and CT scan results. He is accompanied by his wife via virtual visit.  His breathing is stable, and he can walk across the floor without needing to rest. He experiences occasional coughing. No major issues. No wheezing, hemoptysis, or weight loss. He quit smoking in 2019 and has a history of COPD, diagnosed as moderate (stage 2). Repeat PFT showed improvement in lung function compared to 5 years ago.  He is currently using Stiolto, taking two puffs every morning, and uses his albuterol  inhaler only as needed for significant shortness of breath. He is on oxygen  at night. No concerns or complaints today.  Never did ONO.   07/02/2024: Today - follow up Discussed the use of AI scribe software for clinical note transcription with the patient, who gave verbal consent to proceed.  History of Present Illness  James Stokes is a 68 year old male who presents for follow-up regarding his oxygen  therapy and breathing management.  He recently underwent an overnight oxygen  study and continues to use oxygen  therapy. He is currently using two liters of oxygen  at night, and the renewal for his oxygen  supply has been sent to the medical supply company.  He experienced a brief episode of shortness of breath a couple of days ago, which was effectively managed with his rescue inhaler. He has not had any further issues since then. He mentions that the nebulizer solution previously provided caused more problems than it solved, and he prefers using the  inhaler for acute relief.  He has not received a flu shot this year and is reluctant to do so, citing a previous experience where the flu shot made him feel unwell.  No fevers, increased cough, wheezing, chest congestion.     Allergies  Allergen Reactions   Other Hives and Itching   Shrimp [Shellfish Allergy] Itching and Rash   Immunization History  Administered Date(s) Administered   Tdap 08/12/2022   Past  Medical History:  Diagnosis Date   Acute diastolic CHF (congestive heart failure) (HCC) 08/22/2018   Atherosclerotic plaque on cardiac vessels, seen on CT chest 08/22/2018   Dyslipidemia    ETOH abuse    HTN (hypertension)    Hypertriglyceridemia    Kidney stones    Sleep apnea in adult 08/29/2018   C-pap ordered June 2020- Dr Shellia reviewed the study   Smoker     Tobacco History: Social History   Tobacco Use  Smoking Status Former   Current packs/day: 0.00   Average packs/day: 2.0 packs/day for 43.0 years (86.0 ttl pk-yrs)   Types: Cigarettes   Start date: 08/18/1975   Quit date: 08/17/2018   Years since quitting: 5.8   Passive exposure: Past  Smokeless Tobacco Never   Counseling given: Not Answered   Outpatient Medications Prior to Visit  Medication Sig Dispense Refill   albuterol  (PROVENTIL ) (2.5 MG/3ML) 0.083% nebulizer solution Take 3 mLs (2.5 mg total) by nebulization every 6 (six) hours as needed for wheezing or shortness of breath. 150 mL 1   allopurinol  (ZYLOPRIM ) 100 MG tablet TAKE 1 TABLET BY MOUTH DAILY 30 tablet 6   aspirin  81 MG tablet Take 81 mg by mouth every morning.      diltiazem  (CARDIZEM  CD) 300 MG 24 hr capsule TAKE 1 CAPSULE BY MOUTH DAILY 90 capsule 3   EPINEPHrine  (EPIPEN  2-PAK) 0.3 mg/0.3 mL IJ SOAJ injection Inject 0.3 mg into the muscle as needed for anaphylaxis. 1 each 1   losartan -hydrochlorothiazide  (HYZAAR) 100-25 MG tablet TAKE 1 TABLET BY MOUTH DAILY 90 tablet 1   Multiple Vitamins-Minerals (MULTIVITAMIN ADULT PO) Take 1 tablet by mouth every morning.      rosuvastatin  (CRESTOR ) 20 MG tablet TAKE 1 TABLET BY MOUTH DAILY 90 tablet 3   spironolactone  (ALDACTONE ) 25 MG tablet TAKE 1/2 TABLET BY MOUTH DAILY 45 tablet 3   Tiotropium Bromide -Olodaterol 2.5-2.5 MCG/ACT AERS Inhale 2 puffs into the lungs daily. 1 each 11   icosapent  Ethyl (VASCEPA ) 1 g capsule Take 2 capsules (2 g total) by mouth 2 (two) times daily. (Patient not taking: Reported on  07/02/2024) 120 capsule 5   No facility-administered medications prior to visit.     Review of Systems:   Constitutional: No weight loss or gain, night sweats, fevers, chills, or lassitude. +fatigue  HEENT: No headaches, nasal congestion, postnasal drainage  CV:  No chest pain, orthopnea, PND, swelling in lower extremities, anasarca, dizziness, palpitations, syncope Resp: +baseline shortness of breath with exertion; stable minimal cough. No excess mucus or change in color of mucus. No hemoptysis. No wheezing.  No chest wall deformity GI:  No heartburn, indigestion, loss of appetite GU: No dysuria, change in color of urine, urgency or frequency.  No flank pain, no hematuria  Neuro: No memory impairment  Psych: No depression or anxiety. Mood stable.   Observations/Objective: Patient is well-developed, well-nourished in no acute distress. Resting comfortably at home.  No labored breathing.  Speech is clear and coherent with logical content.  Patient is alert  and oriented at baseline.   Assessment and Plan: Nocturnal hypoxia ONO on room air with significant hypoxia. Renewed supplemental O2 for nocturnal use at 2 lpm. No exertional hypoxia. Goal >88-90%  Patient Instructions  Continue Stiolto 2 puffs daily Continue Albuterol  inhaler 2 puffs every 6 hours as needed for shortness of breath or wheezing. Notify if symptoms persist despite rescue inhaler/neb use.    Referral to lung cancer screening program - due next July 2026  Continue to wear oxygen  2 lpm at night   Follow up in 6 months with Dr. Geronimo or Izetta Stokes PIETY. If symptoms do not improve or worsen, please contact office for sooner follow up or seek emergency care.   COPD (chronic obstructive pulmonary disease) (HCC) Moderate COPD, stable on current regimen. No recent exacerbations or hospitalizations. Continue stiolto and PRN albuterol . Graded exercises encouraged. Action plan in place. Trigger prevention reviewed.  Encouraged to obtain flu shot; pt declined.     I discussed the assessment and treatment plan with the patient. The patient was provided an opportunity to ask questions and all were answered. The patient agreed with the plan and demonstrated an understanding of the instructions.   The patient was advised to call back or seek an in-person evaluation if the symptoms worsen or if the condition fails to improve as anticipated.  I provided 21 minutes of non-face-to-face time during this encounter.   James LULLA Malachy, NP

## 2024-07-02 NOTE — Patient Instructions (Addendum)
 Continue Stiolto 2 puffs daily Continue Albuterol  inhaler 2 puffs every 6 hours as needed for shortness of breath or wheezing. Notify if symptoms persist despite rescue inhaler/neb use.    Referral to lung cancer screening program - due next July 2026  Continue to wear oxygen  2 lpm at night   Follow up in 6 months with Dr. Geronimo or James Stokes. If symptoms do not improve or worsen, please contact office for sooner follow up or seek emergency care.

## 2024-07-02 NOTE — Telephone Encounter (Unsigned)
 Copied from CRM [id number]. Topic: Clinical - Medication Refill >> Jul 02, 2024 11:17 AM Carlatta H wrote: Medication: Ventolin  Rescue Inhaler   Has the patient contacted their pharmacy? No (Agent: If no, request that the patient contact the pharmacy for the refill. If patient does not wish to contact the pharmacy document the reason why and proceed with request.) (Agent: If yes, when and what did the pharmacy advise?)  This is the patient's preferred pharmacy:   [facility] - [place], [place] - [address] [place] [address] Phone: [phone] Fax: [phone]   Is this the correct pharmacy for this prescription? Yes If no, delete pharmacy and type the correct one.   Has the prescription been filled recently? No  Is the patient out of the medication? Yes  Has the patient been seen for an appointment in the last year OR does the patient have an upcoming appointment? Yes  Can we respond through MyChart? Yes  Agent: Please be advised that Rx refills may take up to 3 business days. We ask that you follow-up with your pharmacy.

## 2024-07-03 ENCOUNTER — Encounter: Payer: Self-pay | Admitting: Nurse Practitioner

## 2024-07-03 NOTE — Assessment & Plan Note (Signed)
 ONO on room air with significant hypoxia. Renewed supplemental O2 for nocturnal use at 2 lpm. No exertional hypoxia. Goal >88-90%  Patient Instructions  Continue Stiolto 2 puffs daily Continue Albuterol  inhaler 2 puffs every 6 hours as needed for shortness of breath or wheezing. Notify if symptoms persist despite rescue inhaler/neb use.    Referral to lung cancer screening program - due next July 2026  Continue to wear oxygen  2 lpm at night   Follow up in 6 months with James Stokes or James Stokes. If symptoms do not improve or worsen, please contact office for sooner follow up or seek emergency care.

## 2024-07-03 NOTE — Assessment & Plan Note (Addendum)
 Moderate COPD, stable on current regimen. No recent exacerbations or hospitalizations. Continue stiolto and PRN albuterol . Graded exercises encouraged. Action plan in place. Trigger prevention reviewed. Encouraged to obtain flu shot; pt declined.

## 2024-07-24 ENCOUNTER — Other Ambulatory Visit: Payer: Self-pay | Admitting: Family Medicine

## 2024-09-24 ENCOUNTER — Other Ambulatory Visit: Payer: Self-pay | Admitting: Family Medicine

## 2024-09-28 ENCOUNTER — Other Ambulatory Visit: Payer: Self-pay | Admitting: Cardiovascular Disease

## 2024-10-01 ENCOUNTER — Other Ambulatory Visit: Payer: Self-pay | Admitting: Family Medicine

## 2024-10-19 ENCOUNTER — Other Ambulatory Visit (HOSPITAL_BASED_OUTPATIENT_CLINIC_OR_DEPARTMENT_OTHER): Payer: Self-pay | Admitting: Cardiovascular Disease

## 2024-10-19 DIAGNOSIS — I10 Essential (primary) hypertension: Secondary | ICD-10-CM

## 2024-10-19 DIAGNOSIS — I5032 Chronic diastolic (congestive) heart failure: Secondary | ICD-10-CM

## 2024-10-19 DIAGNOSIS — R600 Localized edema: Secondary | ICD-10-CM

## 2024-10-27 ENCOUNTER — Other Ambulatory Visit: Payer: Self-pay | Admitting: Cardiovascular Disease

## 2024-10-31 ENCOUNTER — Other Ambulatory Visit: Payer: Self-pay | Admitting: Cardiovascular Disease
